# Patient Record
Sex: Female | Born: 1965 | Race: White | Hispanic: No | Marital: Single | Smoking: Current every day smoker
Health system: Southern US, Community
[De-identification: ages and names within clinical notes are randomized; demographics above are authoritative.]

## PROBLEM LIST (undated history)

## (undated) DIAGNOSIS — F1994 Other psychoactive substance use, unspecified with psychoactive substance-induced mood disorder: Secondary | ICD-10-CM

## (undated) DIAGNOSIS — F1910 Other psychoactive substance abuse, uncomplicated: Secondary | ICD-10-CM

## (undated) DIAGNOSIS — F410 Panic disorder [episodic paroxysmal anxiety] without agoraphobia: Secondary | ICD-10-CM

## (undated) DIAGNOSIS — R7611 Nonspecific reaction to tuberculin skin test without active tuberculosis: Secondary | ICD-10-CM

## (undated) DIAGNOSIS — F32A Depression, unspecified: Secondary | ICD-10-CM

## (undated) DIAGNOSIS — G8929 Other chronic pain: Secondary | ICD-10-CM

## (undated) DIAGNOSIS — E1140 Type 2 diabetes mellitus with diabetic neuropathy, unspecified: Secondary | ICD-10-CM

## (undated) DIAGNOSIS — I10 Essential (primary) hypertension: Secondary | ICD-10-CM

## (undated) DIAGNOSIS — K219 Gastro-esophageal reflux disease without esophagitis: Secondary | ICD-10-CM

## (undated) DIAGNOSIS — N390 Urinary tract infection, site not specified: Secondary | ICD-10-CM

## (undated) DIAGNOSIS — E119 Type 2 diabetes mellitus without complications: Secondary | ICD-10-CM

## (undated) DIAGNOSIS — F419 Anxiety disorder, unspecified: Secondary | ICD-10-CM

## (undated) DIAGNOSIS — M549 Dorsalgia, unspecified: Secondary | ICD-10-CM

## (undated) DIAGNOSIS — G629 Polyneuropathy, unspecified: Secondary | ICD-10-CM

## (undated) DIAGNOSIS — F329 Major depressive disorder, single episode, unspecified: Secondary | ICD-10-CM

## (undated) HISTORY — DX: Type 2 diabetes mellitus with diabetic neuropathy, unspecified: E11.40

## (undated) HISTORY — DX: Gastro-esophageal reflux disease without esophagitis: K21.9

## (undated) HISTORY — PX: CHOLECYSTECTOMY: SHX55

## (undated) HISTORY — DX: Urinary tract infection, site not specified: N39.0

## (undated) HISTORY — PX: TONSILLECTOMY: SUR1361

## (undated) HISTORY — PX: ABDOMINAL HYSTERECTOMY: SHX81

## (undated) HISTORY — DX: Nonspecific reaction to tuberculin skin test without active tuberculosis: R76.11

## (undated) HISTORY — PX: ROTATOR CUFF REPAIR: SHX139

---

## 1998-08-18 ENCOUNTER — Other Ambulatory Visit: Admission: RE | Admit: 1998-08-18 | Discharge: 1998-08-18 | Payer: Self-pay | Admitting: Obstetrics and Gynecology

## 1999-10-16 ENCOUNTER — Encounter: Admission: RE | Admit: 1999-10-16 | Discharge: 1999-10-16 | Payer: Self-pay | Admitting: Internal Medicine

## 2000-01-04 ENCOUNTER — Other Ambulatory Visit: Admission: RE | Admit: 2000-01-04 | Discharge: 2000-01-04 | Payer: Self-pay | Admitting: Obstetrics and Gynecology

## 2004-03-17 ENCOUNTER — Other Ambulatory Visit: Admission: RE | Admit: 2004-03-17 | Discharge: 2004-03-17 | Payer: Self-pay | Admitting: Obstetrics and Gynecology

## 2004-08-14 ENCOUNTER — Observation Stay (HOSPITAL_COMMUNITY): Admission: RE | Admit: 2004-08-14 | Discharge: 2004-08-15 | Payer: Self-pay | Admitting: Obstetrics and Gynecology

## 2007-02-24 ENCOUNTER — Emergency Department (HOSPITAL_COMMUNITY): Admission: EM | Admit: 2007-02-24 | Discharge: 2007-02-24 | Payer: Self-pay | Admitting: Emergency Medicine

## 2007-03-08 ENCOUNTER — Inpatient Hospital Stay (HOSPITAL_COMMUNITY): Admission: EM | Admit: 2007-03-08 | Discharge: 2007-03-11 | Payer: Self-pay | Admitting: General Surgery

## 2007-03-09 ENCOUNTER — Encounter (INDEPENDENT_AMBULATORY_CARE_PROVIDER_SITE_OTHER): Payer: Self-pay | Admitting: General Surgery

## 2008-12-13 ENCOUNTER — Encounter: Admission: RE | Admit: 2008-12-13 | Discharge: 2008-12-13 | Payer: Self-pay | Admitting: Family Medicine

## 2009-01-28 ENCOUNTER — Ambulatory Visit (HOSPITAL_COMMUNITY): Admission: RE | Admit: 2009-01-28 | Discharge: 2009-01-29 | Payer: Self-pay | Admitting: Obstetrics and Gynecology

## 2009-01-28 ENCOUNTER — Encounter (INDEPENDENT_AMBULATORY_CARE_PROVIDER_SITE_OTHER): Payer: Self-pay | Admitting: Obstetrics and Gynecology

## 2009-05-01 ENCOUNTER — Ambulatory Visit (HOSPITAL_BASED_OUTPATIENT_CLINIC_OR_DEPARTMENT_OTHER): Admission: RE | Admit: 2009-05-01 | Discharge: 2009-05-01 | Payer: Self-pay | Admitting: General Surgery

## 2009-05-01 ENCOUNTER — Encounter (INDEPENDENT_AMBULATORY_CARE_PROVIDER_SITE_OTHER): Payer: Self-pay | Admitting: General Surgery

## 2011-01-10 LAB — CBC
Hemoglobin: 12.2 g/dL (ref 12.0–15.0)
MCHC: 31.7 g/dL (ref 30.0–36.0)
Platelets: 344 10*3/uL (ref 150–400)
RDW: 18.9 % — ABNORMAL HIGH (ref 11.5–15.5)

## 2011-01-10 LAB — APTT: aPTT: 32 seconds (ref 24–37)

## 2011-01-10 LAB — PROTIME-INR
INR: 1 (ref 0.00–1.49)
Prothrombin Time: 13.4 seconds (ref 11.6–15.2)

## 2011-01-10 LAB — URINALYSIS, ROUTINE W REFLEX MICROSCOPIC
Glucose, UA: NEGATIVE mg/dL
Ketones, ur: NEGATIVE mg/dL
Protein, ur: NEGATIVE mg/dL
pH: 5.5 (ref 5.0–8.0)

## 2011-01-10 LAB — BASIC METABOLIC PANEL
CO2: 24 mEq/L (ref 19–32)
Calcium: 9.2 mg/dL (ref 8.4–10.5)
Creatinine, Ser: 0.78 mg/dL (ref 0.4–1.2)
GFR calc non Af Amer: >60 mL/min (ref >60)
Glucose, Bld: 125 mg/dL — ABNORMAL HIGH (ref 70–99)
Sodium: 136 mEq/L (ref 135–145)

## 2011-01-10 LAB — URINE MICROSCOPIC-ADD ON

## 2011-01-10 LAB — DIFFERENTIAL
Basophils Absolute: 0.1 10*3/uL (ref 0.0–0.1)
Basophils Relative: 0 % (ref 0–1)
Lymphocytes Relative: 30 % (ref 12–46)
Monocytes Absolute: 0.9 10*3/uL (ref 0.1–1.0)
Neutro Abs: 8.8 10*3/uL — ABNORMAL HIGH (ref 1.7–7.7)
Neutrophils Relative %: 62 % (ref 43–77)

## 2011-01-13 LAB — BASIC METABOLIC PANEL
BUN: 11 mg/dL (ref 6–23)
BUN: 8 mg/dL (ref 6–23)
Calcium: 8.5 mg/dL (ref 8.4–10.5)
Calcium: 8.9 mg/dL (ref 8.4–10.5)
Chloride: 104 mEq/L (ref 96–112)
Chloride: 108 mEq/L (ref 96–112)
Creatinine, Ser: 0.66 mg/dL (ref 0.4–1.2)
Creatinine, Ser: 0.82 mg/dL (ref 0.4–1.2)
GFR calc Af Amer: >60 mL/min (ref >60)
GFR calc Af Amer: >60 mL/min (ref >60)
GFR calc non Af Amer: >60 mL/min (ref >60)

## 2011-01-13 LAB — GLUCOSE, CAPILLARY
Glucose-Capillary: 112 mg/dL — ABNORMAL HIGH (ref 70–99)
Glucose-Capillary: 129 mg/dL — ABNORMAL HIGH (ref 70–99)
Glucose-Capillary: 98 mg/dL (ref 70–99)

## 2011-01-13 LAB — CBC
MCHC: 32.4 g/dL (ref 30.0–36.0)
MCV: 74.7 fL — ABNORMAL LOW (ref 78.0–100.0)
MCV: 75.3 fL — ABNORMAL LOW (ref 78.0–100.0)
Platelets: 315 10*3/uL (ref 150–400)
Platelets: 402 10*3/uL — ABNORMAL HIGH (ref 150–400)
RBC: 4.84 MIL/uL (ref 3.87–5.11)
WBC: 13.5 10*3/uL — ABNORMAL HIGH (ref 4.0–10.5)
WBC: 14.5 10*3/uL — ABNORMAL HIGH (ref 4.0–10.5)

## 2011-01-13 LAB — PREGNANCY, URINE: Preg Test, Ur: NEGATIVE

## 2011-02-16 NOTE — Op Note (Signed)
Claudia Erickson NO.:  [id number]   MEDICAL RECORD NO.:  [id number]          PATIENT TYPE:  INP   LOCATION:  1333                         FACILITY:  [facility]   PHYSICIAN:  Anselm Pancoast. Weatherly, M.D.DATE OF BIRTH:  1966-01-26   DATE OF PROCEDURE:  03/09/2007  DATE OF DISCHARGE:                               OPERATIVE REPORT   PREOPERATIVE DIAGNOSIS:  Subacute cholecystitis with stones.   POSTOPERATIVE DIAGNOSIS:  Acute cholecystitis with stones.   OPERATION:  Laparoscopic cholecystectomy with no cholangiogram.   SURGEON:  Anselm Pancoast. Zachery Dakins, M.D.   ASSISTANT:  Velora Heckler, M.D.   HISTORY:  Claudia Erickson is a 45 year old female who has had previous  episodes of upper abdominal and epigastric pain.  For three weeks, she  has had more intense pain.  She had an ultrasound at [facility] on May  28 and was seen by Dr. Lurene Shadow in the office on June 4.  He recommended  that she be admitted from the office.  I was called, did not think she  would need surgery yesterday, and the surgery scheduling would have been  next to impossible, anyway.  She is not febrile.  She was started on  Unasyn and I saw her early this morning and added her to the OR schedule  for today.  She has no known drug allergies.  She had been started on  Unasyn and she was given a dose prior to going to the operating room.   DESCRIPTION OF PROCEDURE:  With the patient positioned on the OR table,  induction of general anesthesia, endotracheal tube, oral tube into the  stomach, then the abdomen was prepped with Betadine solution and draped  in a sterile manner.  A small incision was made below the umbilicus.  The deep ends of the [employer] was used to expose the fascia.  A small  vertical opening was made, the fascia picked up, the underlying  posterior peritoneum fascia was picked up, and I carefully entered into  the peritoneal cavity.  A purse-string suture of 0 Vicryl was placed and  the Hasson cannula introduced.  Upon placing the scope in the abdomen,  you could see a bulge in the right upper quadrant.  The liver looked  grossly unremarkable.  The upper 10 mm trocar was placed in the  subxiphoid area and the two lateral 5 mm trocars were placed at the  lateral appropriate position.   On trying to elevate the liver and peel this omentum off of this very  thickened gallbladder, it was difficult and after the top part of the  gallbladder had been exposed, we then switched to the 30 degree scope  for better exposure.  With traction and counter traction and very  carefully dissecting, we could not see exactly where the proximal  portion of the gallbladder is but we were taking very deliberate care  not to damage any of the common or hepatic duct.  We dissected down  through the fatty tissue then could see what looks like a transition.  There was a big stone impacted in the neck  of the gallbladder and this  was pushed back to the fundus of the gallbladder.  Then, very carefully,  encompassed this, what we think is the cystic duct, with a right angle  and put a clip on the junction of the cystic duct and the gallbladder.  A small opening was made proximally and we could see just a little bit  of bile, but attempts to get a cholangiogram catheter in it were  difficult and we held it in place with a clip and we could see just a  little bit of the cystic duct.  On trying to position the catheter, the  cystic duct was divided and separated and we elected to elevate it and  just doubly clip the cystic duct proximally, taking care that we were  not entering any of the common hepatic or common bile duct.  We were  comfortable with the clips, had a good closure.  Then, the anterior  branch of the cystic artery was identified, doubly clipped proximally,  singly distally, and then divided.  Using the hook electrocautery then  switching to the spatula, basically it was just necessary  to carve out  this very thickened wall gallbladder.  The fascial planes were basically  obliterated and we continued working, identifying the posterior branch  of the cystic artery that was, likewise, doubly clipped proximally and  divided.  Then, with dissection and irrigating carefully, we could get  the more proximal portion of the gallbladder separated from the bed.  The gallbladder was basically intrahepatic and as we ruffled the high  posterior wall, we did get into the gallbladder.  Fortunately, the  stones stayed in the proximal portion of the gallbladder.  We grasped  the posterior portion and just carved it out with cautery.  The  gallbladder and stones were placed in an endocatch bag.  I coagulated  the bed for good hemostasis, reinspected, and did not see any bile  leakage.  We had already decided that we were going to use a 19 Blake  drain which we did place.  We placed a couple of pieces of Surgicel in  the gallbladder fossa, coagulated it, and there was no bleeding.  We  then placed a 19 Blake drain through the lateral port and this was up in  the subhepatic area.  Next, the drain was sutured to the skin.  We  reinspected.  The irrigating fluid had been aspirated, there was no  bleeding.  We then switched the camera to the upper 10 mm port and  grasped the bag containing the gallbladder and brought it up at the  fascial level.  I opened the fascia just a little more so that we could  bring the bag containing the gallbladder from the peritoneal cavity.  The gallbladder removed, an additional figure-of-eight of 0 Vicryl was  placed in the fascia, both purse-string and figure-of-eight were tied.  Then, the fascia was anesthetized with Marcaine.  The subcutaneous  tissue was closed with 4-0 Vicryl.  Benzoin and Steri-Strips were placed  on the skin.  The patient tolerated the procedure satisfactory.  Steri- Strips on the skin.  We then extubated her and sent her to the  recovery  room in stable postoperative condition.  I am going to keep her on three  doses of Unasyn and hopefully, she will be ready for discharge tomorrow.  She will have the drain removed in the office on Monday or Tuesday.  I  will check the  liver function tests tomorrow morning and hopefully she  will be ready for discharge.           ______________________________  Anselm Pancoast. Zachery Dakins, M.D.     WJW/MEDQ  D:  03/09/2007  T:  03/09/2007  Job:  [id number]

## 2011-02-16 NOTE — Op Note (Signed)
Claudia Erickson, Claudia Erickson               ACCOUNT NO.:  [id number]   MEDICAL RECORD NO.:  [id number]          PATIENT TYPE:  OIB   LOCATION:  9309                          FACILITY:  [facility]   PHYSICIAN:  Sherron Monday, MD        DATE OF BIRTH:  Nov 30, 1965   DATE OF PROCEDURE:  01/28/2009  DATE OF DISCHARGE:                               OPERATIVE REPORT   PREOPERATIVE DIAGNOSES:  Menorrhagia, dysmenorrhea, complex ovarian  cyst.   POSTOPERATIVE DIAGNOSES:  Menorrhagia, dysmenorrhea, normal bilateral  ovaries.   PROCEDURE:  Laparoscopic-assisted vaginal hysterectomy.   SURGEON:  Sherron Monday, MD   ASSISTANT:  Zenaida Niece, MD   ANESTHESIA:  General with 10 mL of 1% lidocaine for local anesthetic.   FINDINGS:  Normal uterus approximately 6 weeks size with small  fibroids, bilateral normal ovaries.   SPECIMEN:  Uterus and cervix.   DISPOSITION:  To pathology.   ESTIMATED BLOOD LOSS:  200 mL.   IV FLUIDS:  2900 mL.   URINE OUTPUT:  100 mL clear urine at the end of the procedure.   COMPLICATIONS:  None.   DISPOSITION:  Stable to PACU following the procedure.   PROCEDURE:  After informed consent was reviewed with the patient  including risks, benefits, and alternatives of surgical procedure, she  was transported to the OR and placed on the table in supine position.  General anesthesia was then induced and found to be adequate.  She was  then placed on the yellow fin stirrups, prepped and draped in the normal  sterile fashion.  Her bladder was sterilely drained using an open-sided  speculum.  Her cervix was grasped with single-tooth tenaculum and the  uterine manipulator was placed.  Gloves were changed.  Attention was  turned to the abdominal portion of the case and approximately 5 mL  infraumbilical incision was made using the Veress needle.  The  peritoneum was entered.  It was carefully checked to make sure the  peritoneum has been entered with the hanging drop test.  The gas  was  then instilled using direct entry the 5 mL trocar was placed.  The  accessory ports were placed after careful visualization of the  epigastric vessels bilaterally at the level of the umbilicus using the  blunt tip probe.  Her pelvic survey was performed revealing normal  uterus and ovaries with evidence of prior tubal ligation using a 5-mm  single-tooth tenaculum was used for uterine manipulation and using the  harmonic scalpel the tube and round ligament were transected and the  cardinal ligaments were separated.  A bladder flap was created.  Attention was then turned to the right side which in a similar fashion.  The round ligament and tube were transected and using the harmonic  scalpel the cardinal ligament was incised.  The uterine artery was also  sacrificed to the right with good hemostasis being assured following  this procedure.  Attention was then turned to the vaginal portion of the  case.  A long heavy weighted speculum was placed in her vagina with a  Sims retractor.  Her cervix was visualized grasped with digitally and  circumscribed using Bovie cautery.  The peritoneum was then entered in  the posterior aspect and the uterosacral ligaments were transected and  suture ligated and held.  Bites were taken bilaterally to the level that  the uterus was able to be delivered without complication.  The pedicles  were found to be hemostatic.  The uterosacral ligaments were plicated  and the held sutures were tied together.  The cuff was closed  incorporating the peritoneum.  The cuff was closed with 2-0 Vicryl in a  running fashion and small amount of excess vaginal tissue was excised  and included with the specimen.  Gloves were changed.  Attention was  turned to the abdominal portion of the case.  A brief inspection  revealed hemostasis, so the ports were removed under direct  visualization.  The pneumoperitoneum was evacuated.  The ports were  closed with 0 Vicryl, the deep  stitch, and 4-0 Vicryl at the level of  the skin, and the skin was closed with Dermabond as well.  Sponge, lap,  and needle counts were correct x2 at the end of the procedure.       Sherron Monday, MD  Electronically Signed     JB/MEDQ  D:  01/28/2009  T:  01/29/2009  Job:  [id number]

## 2011-02-16 NOTE — H&P (Signed)
Claudia Erickson, Claudia Erickson NO.:  [id number]   MEDICAL RECORD NO.:  [id number]         PATIENT TYPE:  WAMB   LOCATION:                                FACILITY:  [facility]   PHYSICIAN:  Sherron Monday, MD        DATE OF BIRTH:  02-13-1966   DATE OF ADMISSION:  01/28/2009  DATE OF DISCHARGE:                              HISTORY & PHYSICAL   ADMITTING DIAGNOSIS:  Dysmenorrhea, menorrhagia as well as complex  ovarian cyst.   PROCEDURE PLANNED:  Laparoscopic-assisted vaginal hysterectomy with  plus/minus BSO.   HISTORY OF PRESENT ILLNESS:  A 45 year old G6, P 2-0-4-2 who presents  complaining of painful menses that she describes are heavy and passing  multiple clots as well lots of premenstrual symptoms.  She states she  has tried ibuprofen and heating pads without relief.  After the  discussion of options including hormonal management, NovaSure,  endometrial ablation versus hysterectomy the patient desired a  hysterectomy for definitive management.  Discussed with the patient  risks, benefits and alternatives of the procedure.  She has complained  also of occasional night sweats as well as some incontinence, this is  following a urethral sling and she was advised to consult with urology.   PAST MEDICAL HISTORY:  Significant for depression and anxiety, she is on  medications and states her mood is currently okay.   PAST SURGICAL HISTORY:  1. Left rotator cuff surgery.  2. Bilateral tubal ligation.  3. Urethral sling in 2005.  4. Laparoscopic cholecystectomy.   MEDICATIONS:  Cymbalta, Xanax and omeprazole.   ALLERGIES:  NO KNOWN DRUG ALLERGIES.   SOCIAL HISTORY:  She admits to smoking a quarter pack a day, no alcohol  or drug use.  She is divorced and unemployed.   PAST GYN HISTORY:  G6, P2-0-4-2 with pregnancies G1 to G3 were first  trimester therapeutic abortions, G4 was a term vaginal delivery of a  female infant weighing 8 pounds 14 ounces, G5 was a therapeutic  abortion,  G6 with a term vaginal delivery of female infant weighing 7 pounds 14  ounces and she had pregnancy-induced hypertension with this pregnancy.  She is sexually active with a female partner.  She believes they are  monogamous.  Contraception includes bilateral tubal ligation as well as  a vasectomy.  States she has no intermenstrual bleeding, no postcoital  bleeding, no problems with this discharge, itching or burning, no  problems with dyspareunia.   GYN HISTORY:  Has no history of any abnormal Pap smears, her last was in  March of 2010, within normal limits.  No history of any sexually  transmitted diseases.  Menarche at age 34.  Monthly cycles are between 3  - 4 days and 7 days and described as heavy and painful.   PHYSICAL EXAM:  She is 5 foot 9, weighs 318 pounds, blood pressure  126/82.  GENERAL:  No apparent distress.  HEENT:  Mucous membranes are moist.  NECK:  No lymphadenopathy, thyroid within normal limits.  HEART:  Regular rate and rhythm.  LUNGS:  Clear to auscultation bilaterally.  BACK:  No costovertebral angle tenderness.  BREASTS:  D cup, no masses noted, nontender, no distortion.  ABDOMEN:  Soft, nontender, no distortion.  Good bowel sounds and she is  obese.  EXTREMITIES:  Symmetric and nontender.  PELVIC:  Normal external female genitalia, normal Bartholin's, urethral  and Skene gland with good support.  Cervix and vagina without lesions.  No cervical motion tenderness.  Uterus was a normal size.  Adnexa no  masses, nontender.  The bimanual exam is difficult secondary to her  habitus.   ASSESSMENT AND PLAN:  A 45 year old G6, P2-0-4-2 who presents desiring  definitive management for menorrhagia and dysmenorrhea.  Discussed with  the patient possibility of continued pelvic pain and premenstrual  syndrome symptoms as well as likely continuation of her incontinence  until she has urology evaluation if they decide to do any therapy for  this.  Discussed with  patient risks, benefits and alternatives of the  surgical procedure including bleeding, infection, damage to the  surrounding organs as well as trouble healing and possibility of needing  an open procedure.  She voices understanding to all this and we will  proceed with a hysterectomy on April 27.      Sherron Monday, MD  Electronically Signed     JB/MEDQ  D:  01/27/2009  T:  01/27/2009  Job:  [id number]

## 2011-02-16 NOTE — Discharge Summary (Signed)
Claudia Erickson, Claudia Erickson               ACCOUNT NO.:  [id number]   MEDICAL RECORD NO.:  [id number]          PATIENT TYPE:  OIB   LOCATION:  9309                          FACILITY:  [facility]   PHYSICIAN:  Sherron Monday, MD        DATE OF BIRTH:  1966/02/24   DATE OF ADMISSION:  01/28/2009  DATE OF DISCHARGE:  01/29/2009                               DISCHARGE SUMMARY   ADMITTING DIAGNOSES:  Dysmenorrhea, menorrhagia, complex ovarian cyst.   DISCHARGE DIAGNOSES:  Dysmenorrhea, menorrhagia, complex ovarian cyst,  status post laparoscopic-assisted vaginal hysterectomy.   PROCEDURE:  Laparoscopic-assisted vaginal hysterectomy.   HISTORY OF PRESENT ILLNESS:  A 45 year old G6, P 2-0-4-2, who initially  presented with dysmenorrhea and menorrhagia.  For complete H&P, please  see the dictated note.  In brief, the patient underwent surgery on the  27th without complication and EBL of 200 mL.  Findings were;  approximately 6 weeks' size uterus with small exophytic fibroids and a  normal appearing bilateral ovaries.  Prior to surgery, fasting had some  elevated blood sugars, this was monitored.  After surgery, her sugars  were not found to be in a dangerous level.  She will be referred to a  general doctor, as she does not have one following her hospitalization.  Her postoperative course, she is afebrile.  Vital signs stable.  She was  ambulating well, tolerating p.o., and her pain was well controlled.  On  postoperative day #1, she was discharged to home with routine discharge  instructions and numbers to call with any questions or problems, as well  as prescriptions for Motrin and Vicodin and instructions to continue  with a multivitamin.  Her hemoglobin decreased from 11.6 to 9.4.  Her  creatinine increased from 0.66-0.82.      Sherron Monday, MD  Electronically Signed     JB/MEDQ  D:  01/29/2009  T:  01/29/2009  Job:  [id number]

## 2011-02-16 NOTE — H&P (Signed)
NAMENARCISSA, MELDER NO.:  [id number]   MEDICAL RECORD NO.:  [id number]          PATIENT TYPE:  INP   LOCATION:  1333                         FACILITY:  [facility]   PHYSICIAN:  Anselm Pancoast. Weatherly, M.D.DATE OF BIRTH:  [birth year]   DATE OF ADMISSION:  03/08/2007  DATE OF DISCHARGE:                              HISTORY & PHYSICAL   CHIEF COMPLAINT:  Right upper quadrant pain.   HISTORY:  Claudia Erickson is a 45 year old female who was seen in the  office on Wednesday, June 5, for about a 3-week history of recurring  right upper quadrant abdominal pain.  She had an ultrasound and when she  was visiting one of the urgent care centers on May 28 that was  consistent with acute cholecystitis.  She was then seen by her regular  doctor and I am not sure why it was a week before she finally was given  an appointment in the Urgent Care and was seen by Dr. Lurene Shadow.  She was  vaguely tender in the right upper quadrant.  She takes Xanax and over-  the-counter H2 blockers for esophageal reflux symptoms.   She has a past medical history of bladder repair and a rotator cuff  repair.  She denies allergies.  She works as a Medical illustrator.  Dr.  Lurene Shadow sent her over from the office for admission and notified me.  I  did not think she was acute that would require emergency surgery and  ordered labs, as there had not been any laboratory studies detected.  She had her ultrasound of the gallbladder done at [facility] on Mar 01, 2007 that showed findings consistent with acute cholecystitis with a  markedly thickened gallbladder with evidence of large stone, no biliary  organomegaly and no biliary dilatation and a prominent liver.  The  patient was sent on over and orders were sent with her to start her on  Unasyn and lab studies.  The laboratory studies showed white count of  14,200 and liver function studies were unremarkable and her amylase was  normal.  I saw her early this morning  and added her onto the OR  schedule.  Dr. Danella Penton impression was she had a subacute cholecystitis  with stones; I agree with this.  She has exogenous obesity.   PHYSICAL EXAMINATION:  VITAL SIGNS:  Her temperature was normal at 98.2,  her pulse was 80, blood pressure was 124/80.  HEENT:  Unremarkable.  LUNGS:  Clear.  CARDIAC:  Normal sinus rhythm.  ABDOMEN:  She is vaguely tender in the right upper quadrant, a very  large abdomen.  I do not appreciate any umbilical or groin hernias.  There are just no previous abdominal incisions except for the bladder  incision.  EXTREMITIES:  There is no pedal edema.  Good peripheral pulses.  CNS:  Physiologic.           ______________________________  Anselm Pancoast. Zachery Dakins, M.D.     WJW/MEDQ  D:  03/09/2007  T:  03/10/2007  Job:  [id number]

## 2011-02-16 NOTE — Op Note (Signed)
Claudia Erickson, Claudia Erickson               ACCOUNT NO.:  [id number]   MEDICAL RECORD NO.:  [id number]          PATIENT TYPE:  AMB   LOCATION:  DSC                          FACILITY:  [facility]   PHYSICIAN:  Almond Lint, MD       DATE OF BIRTH:  [birth year]   DATE OF PROCEDURE:  05/01/2009  DATE OF DISCHARGE:                               OPERATIVE REPORT   PREOPERATIVE DIAGNOSIS:  Hemorrhoids.   POSTOPERATIVE DIAGNOSIS:  External hemorrhoids.   PROCEDURE:  Excision of posterior external hemorrhoids.   SURGEON:  Almond Lint, MD   ANESTHESIA:  General and local.   FINDINGS:  Circumferential external hemorrhoids, one internal hemorrhoid  at the right posterior lateral position.   SPECIMEN:  Hemorrhoidal tissue to Pathology.   ESTIMATED BLOOD LOSS:  Minimal.   COMPLICATIONS:  None known.   PROCEDURE:  [name] was identified in the holding area and taken to  the operating room where she was placed supine on the operating room  table.  General anesthesia was induced.  She was placed into the  lithotomy position.  Her perineum was prepped and draped in a sterile  fashion.  Time-out was performed according to the surgical safety check  list.  When all was correct, we continued.  The anus was first examined  digitally.  There were no masses palpable.  The anus was then examined  with the anoscope.  Predominantly, the finding with external  hemorrhoids.  There were no significant internal hemorrhoids.  The ones  posteriorly appeared the most friable and large.  These were grasped  with an Allis and taken with the Bovie cautery.  The skin overlying  these was not closed as it would have too large of a defect.  The anus  was reexamined with the anoscope and there was component of an internal  hemorrhoid that was then banded.  The area was then anesthetized with a  mixture of 1% lidocaine plain and 0.25% Marcaine with epinephrine.  The  anus was then dressed with Gelfoam with lidocaine jelly.   This was  advanced into the anus.  The lidocaine jelly was also placed on top of  the hemorrhoidal defect.  The patient was awakened from anesthesia and  taken to PACU in stable condition.      Almond Lint, MD  Electronically Signed     FB/MEDQ  D:  05/01/2009  T:  05/01/2009  Job:  [id number]

## 2011-02-19 NOTE — Op Note (Signed)
NAMETIERRE, GERARD NO.:  [id number]   MEDICAL RECORD NO.:  [id number]          PATIENT TYPE:  OBV   LOCATION:  9304                          FACILITY:  [facility]   PHYSICIAN:  Osborn Coho, M.D.   DATE OF BIRTH:  1966/05/09   DATE OF PROCEDURE:  08/14/2004  DATE OF DISCHARGE:  08/15/2004                                 OPERATIVE REPORT   ADDENDUM:   DESCRIPTION OF PROCEDURE:  (Description of TVT portion of procedure and  anterior repair).   The patient was taken to the operating room as described by Dr. Normand Sloop and  open laparoscopic tubal ligation performed with my assistance.  Upon  attention to the vagina, the TVT and anterior repair was performed.  The TVT  was performed first by making 5 mm incisions bilaterally at the symphysis  pubis two fingerbreadths from the midline after injection of Marcaine.  Attention was then turned to the vagina where dilute Pitressin was injected  in the vaginal mucosa at the level of the mid urethra.  An approximately 1  cm incision was made in the vaginal mucosa at the level of the mid urethra  and the vaginal mucosa undermined bilaterally.  The Foley was removed and a  catheter guide was placed in an 33 [place] Foley which was then inserted into  the urethra and bladder.  While diverting the urethral catheter guide to the  patient's left, the transabdominal guide was placed in the right incision at  the symphysis pubis and followed behind the symphysis pubis in the space of  Retzius and out through the incision at the level of the mid urethra in the  vagina.  The same was done on the left while diverting the catheter guide to  the patient's right.  The catheter guide was then removed and cystoscope  introduced and no entry of the transabdominal guides into the bladder noted.  The TVT was then attached below to the transabdominal guides and passed  through the incisions at the symphysis pubis bilaterally.  This was done  after  draining the bladder completely following cystoscopy.  The Foley was  again removed and cystoscope introduced and no entry of the TVT into the  bladder noted.  The sheath was removed bilaterally while a Tresa Endo was placed  between the TVT and urethra.  There was sufficient slack noted between the  actual TVT and the urethra after the sheath was removed.  The tape was then  cut at the level of the skin bilaterally at the symphysis pubis.  The skin  incisions were closed with a 3-0 Monocryl using a subcuticular stitch. The  vaginal incision was repaired with 2-0 Vicryl in a running locked fashion.  Attention was then turned to the cystocele where a midline incision was  performed, vaginal mucosa undermined bilaterally and cystocele repaired with  2-0 Vicryl using a purse string stitch.  The mucosa was repaired with 2-0  Vicryl in a running locked fashion.  The vagina was packed with plain  packing prepped with estrogen.  The incisions were noted to be hemostatic.  Sponge,  lap and needle counts were correct.  The patient tolerated the  procedure well and was returned to the recovery room in good condition.     Ange   AR/MEDQ  D:  09/08/2004  T:  09/09/2004  Job:  [id number]

## 2011-02-19 NOTE — Op Note (Signed)
Claudia Erickson, Claudia Erickson               ACCOUNT NO.:  [id number]   MEDICAL RECORD NO.:  [id number]          PATIENT TYPE:  INP   LOCATION:  9304                          FACILITY:  [facility]   PHYSICIAN:  Naima A. Dillard, M.D. DATE OF BIRTH:  18-Mar-1966   DATE OF PROCEDURE:  08/14/2004  DATE OF DISCHARGE:                                 OPERATIVE REPORT   PREOPERATIVE DIAGNOSES:  1.  Multiparity.  2.  Stress urinary incontinence.  3.  Cystocele.   POSTOPERATIVE DIAGNOSES:  1.  Multiparity.  2.  Stress urinary incontinence.  3.  Cystocele.   PROCEDURE:  Open laparoscopy, laparoscopic tubal ligation, transvaginal tape  and anterior repair.   ANESTHESIA:  General endotracheal tube.   ESTIMATED BLOOD LOSS:  150 mL.   URINE OUTPUT:  200 mL.   IV FLUIDS:  2200 mL crystalloid.   SURGEON:  Naima A. Normand Sloop, M.D. and Osborn Coho, M.D.   COMPLICATIONS:  None.   FINDINGS:  Normal pelvic and abdominal anatomy.  The uterus was normal size  with two small fibroids less than 1 cm.  Fibroids, normal appearing tubes  and adnexa bilaterally, normal appearing appendix. The patient had a primary  cystocele and the patient had some filmy adhesions from the bowel to the  right mid quadrant.   DESCRIPTION OF PROCEDURE:  The patient was taken to the operating room where  she was given general anesthesia, placed in dorsal lithotomy position and  prepped and draped in a normal sterile fashion.  A Foley catheter was  placed, a bivalve speculum was placed into the vagina.  The anterior lip of  the cervix was grasped with a single tooth tenaculum. The Hulka manipulator  was placed into the uterine cavity through the cervix. A single tooth  tenaculum and bivalve speculum was then removed. Attention was then turned  to the abdomen where 5 mL of 0.25% Marcaine was placed into the inferior  umbilical area.  Claudia Erickson' were used to hold the skin and a 10 mm  infraumbilical incision was made with a scalpel and  carried down to the  fascia. The fascia was then incised and extended bilaterally, the peritoneum  was identified and entered sharply. The Hasson was then placed into the  cavity, intraabdominal placement was confirmed with the laparoscope. The  abdomen was insufflated with 3 liters CO2 gas, findings noted above were  seen.  A 5 mm incision was made 2 cm above the symphysis pubis in the  midline. After 3 mL of 0.25% Marcaine was injected, a 5 mm trocar was placed  under direct visualization of the laparoscope.  The patient's right  fallopian tube was grasped at the mid isthmic portion of the tube and  fulgurated x3, hemostasis was assured. The patient's left fallopian tube was  grasped at the mid isthmic portion of the tube and fulgurated x3.  Hemostasis was assured. All instruments were removed from the abdomen under  direct visualization of the laparoscope. Because it was an open laparoscopy,  sutures were already placed into the fascia before the Hasson was placed and  these were  tied down to close the 10 mm port.  The 5 mm subcuticular  incision was closed with 3-0 Monocryl.  Attention was then turned to the  patient's vagina where anterior repair and TVT was done by Dr. Les Pou and  assisted by me. She will dictate what was done in that case.  AT the end of  the case, the vagina was packed with packing.  The patient went to the  recovery room in stable condition.     Naim   NAD/MEDQ  D:  08/14/2004  T:  08/15/2004  Job:  [id number]

## 2011-02-19 NOTE — H&P (Signed)
Claudia Erickson, Claudia Erickson               ACCOUNT NO.:  [id number]   MEDICAL RECORD NO.:  [id number]          PATIENT TYPE:  INP   LOCATION:  NA                            FACILITY:  [facility]   PHYSICIAN:  Naima A. Dillard, M.D. DATE OF BIRTH:  [birth year]   DATE OF ADMISSION:  08/14/2004  DATE OF DISCHARGE:                                HISTORY & PHYSICAL   CHIEF COMPLAINT:  Multiparity, desires sterilization and stress urinary  incontinence.   HISTORY OF PRESENT ILLNESS:  This patient is a 45 year old white female  gravida 6, para 2, who desires bilateral tubal ligation and TVT.  The  patient used an IUD for 12 years, was recently removed and she desires  permanent sterilization.  The patient also was complaining of urinary  frequency and leakage of fluid with sneezing.  Cystometric's was done by Dr.  Su Hilt which was consistent with stress urinary incontinence.   PAST MEDICAL HISTORY:  As above.   PAST SURGICAL HISTORY:  Significant for rotator cuff repair, tonsillectomy,  wisdom teeth.   PAST OBSTETRIC HISTORY:  Significant for vaginal delivery x2 without any  complications.   MEDICATIONS:  Zoloft.   FAMILY HISTORY:  The patient denies any GYN cancer.  Her mother has  hypertension, no diabetes.   SOCIAL HISTORY:  The patient smokes a pack a day of cigarettes for the last  year.  No alcohol or illicit drug use.   REVIEW OF SYSTEMS:  GENITOURINARY:  As above.  ENDOCRINE:  Unremarkable.  PSYCHIATRIC:  Significant for depression.  CARDIOVASCULAR:  Unremarkable.  GASTROINTESTINAL:  Significant for a hemorrhoid.   PHYSICAL EXAMINATION:  VITAL SIGNS:  The patient's blood pressure is 126/90,  her weight is 197 pounds.  HEART:  A regular rate and rhythm.  LUNGS:  Clear to auscultation bilaterally.  ABDOMEN:  Obese, soft and nontender.  PELVIC:  Vulva and vaginal exams are within normal limits with a primary  cystocele.  Cervix has no lesions with a nontender uterus that is normal  sized, mobile and nontender with no prolapse.  The patient has no adnexal  masses or pain.  RECTAL:  Rectum has a large 2 cm hemorrhoid.   ASSESSMENT:  Multiparity and stress urinary incontinence.   PLAN:  Laparoscopic tubal ligation.  The patient understands the risks are  but not limited to bleeding, infection, damage to internal organs such as  bowel, bladder and major blood vessels.  All birth control was reviewed with  the patient with failure rates and risks and benefits.  The patient still  chooses tubal ligation.  The risk of failure rate  about 1 in 200 or 1 in 300 was also discussed with the patient.  The patient  desires to proceed as above.  Dr. Su Hilt saw the patient and consented her  for TVT and possible anterior posterior repair.  The patient understands the  risks and benefits of that.     Naim   NAD/MEDQ  D:  08/14/2004  T:  08/14/2004  Job:  [id number]

## 2011-02-19 NOTE — Discharge Summary (Signed)
[name], [name] NO.:  [id number]   MEDICAL RECORD NO.:  [id number]          PATIENT TYPE:  INP   LOCATION:  1333                         FACILITY:  [facility]   PHYSICIAN:  Anselm Pancoast. Weatherly, M.D.DATE OF BIRTH:  1966-03-18   DATE OF ADMISSION:  03/08/2007  DATE OF DISCHARGE:  03/11/2007                               DISCHARGE SUMMARY   DISCHARGE DIAGNOSIS:  Acute cholecystitis with stones.   OPERATIONS:  Laparoscopic cholecystectomy with cholangiogram.   HISTORY:  Claudia Erickson is a 45 year old female who was seen in the  office on June 5 for approximately a 3-week history of recurrent right  upper quadrant abdominal pain.  She had an ultrasound when she was  visiting one of the Urgent Care Centers on May 28 that was consistent  with acute cholecystitis.  She was then seen by her regular doctor and  returned to the Urgent Care, and he arranged for her to be seen in the  office, and she was seen by Dr. Lurene Shadow.  She was vaguely tender in the  right upper quadrant. She takes Xanax and over-the-counter medicines for  esophageal reflux. She saw Dr. Lurene Shadow in the office.  He notified me.  Did not think that she had acute cholecystitis but recommended she be  admitted and added on to the OR schedule for the following day. The  ultrasound that had been down at outside facility on May 28 showed  findings for acute cholecystitis with a markedly thickened gallbladder  with a large stone.  There were no dilated bile ducts.   When she was admitted, her white count was 14,200.  We start her on  unison, and I was in agreement that she needed surgery but thought it  was not needed that evening. She was added to the OR schedule for the  following morning and taken to the OR.  Dr. Gerrit Friends assisted.  She had a  markedly inflamed, subacute gallbladder with a large stone impacted in  it.  We were able to successfully remove the gallbladder with the  laparoscope, but, because of  the marked inflammation, etc., placed a  Blake drain.   Postoperatively, she did fine.  She was a little sore, though I thought  would be safe to let her go home. She wanted to stay another day, and I  was in agreement.  She was ready for discharge in improved condition on  March 11, 2007, and we are going to give her Augmentin 875 b.i.d. for 4  days, and then she will return to the office Monday or Tuesday, 3-4 days  later, for removal of the drain and will see Dr. Gerrit Friends.  Her discharge  course has gone nicely.  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  There is  no bile drainage in the drains, and the Blake drain can be removed early  next week.           ______________________________  Anselm Pancoast. Zachery Dakins, M.D.     WJW/MEDQ  D:  03/28/2007  T:  03/28/2007  Job:  [id number]

## 2011-07-22 LAB — CBC
HCT: 32.6 — ABNORMAL LOW
HCT: 34.2 — ABNORMAL LOW
Hemoglobin: 10.5 — ABNORMAL LOW
Hemoglobin: 11.3 — ABNORMAL LOW
MCHC: 33.1
MCV: 72.2 — ABNORMAL LOW
Platelets: 324
RBC: 4.74
WBC: 12.4 — ABNORMAL HIGH

## 2011-07-22 LAB — COMPREHENSIVE METABOLIC PANEL
ALT: 21
ALT: 32
Albumin: 2.6 — ABNORMAL LOW
Alkaline Phosphatase: 45
BUN: 14
BUN: 5 — ABNORMAL LOW
CO2: 27
Calcium: 9.2
Chloride: 107
Creatinine, Ser: 0.88
GFR calc non Af Amer: >60
Glucose, Bld: 106 — ABNORMAL HIGH
Glucose, Bld: 94
Potassium: 4.1
Sodium: 139
Total Bilirubin: 0.4
Total Protein: 5.1 — ABNORMAL LOW

## 2011-07-22 LAB — DIFFERENTIAL
Eosinophils Absolute: 0.2
Lymphocytes Relative: 25
Lymphs Abs: 3.6 — ABNORMAL HIGH
Neutro Abs: 10 — ABNORMAL HIGH
Neutrophils Relative %: 69

## 2011-07-22 LAB — URINALYSIS, ROUTINE W REFLEX MICROSCOPIC
Bilirubin Urine: NEGATIVE
Glucose, UA: NEGATIVE
Ketones, ur: NEGATIVE
Protein, ur: NEGATIVE
pH: 6.5

## 2011-07-22 LAB — LIPASE, BLOOD: Lipase: 19

## 2013-07-05 ENCOUNTER — Other Ambulatory Visit: Payer: Self-pay | Admitting: Dermatology

## 2013-12-05 ENCOUNTER — Emergency Department (HOSPITAL_COMMUNITY): Payer: Self-pay

## 2013-12-05 ENCOUNTER — Inpatient Hospital Stay (HOSPITAL_COMMUNITY)
Admission: EM | Admit: 2013-12-05 | Discharge: 2013-12-07 | DRG: 177 | Disposition: A | Discharge status: Home / Self Care | Payer: Self-pay | Attending: Internal Medicine | Admitting: Internal Medicine

## 2013-12-05 ENCOUNTER — Encounter (HOSPITAL_COMMUNITY): Payer: Self-pay | Admitting: Emergency Medicine

## 2013-12-05 DIAGNOSIS — E872 Acidosis, unspecified: Secondary | ICD-10-CM | POA: Diagnosis present

## 2013-12-05 DIAGNOSIS — J9600 Acute respiratory failure, unspecified whether with hypoxia or hypercapnia: Secondary | ICD-10-CM | POA: Diagnosis present

## 2013-12-05 DIAGNOSIS — Z6841 Body Mass Index (BMI) 40.0 and over, adult: Secondary | ICD-10-CM

## 2013-12-05 DIAGNOSIS — IMO0001 Reserved for inherently not codable concepts without codable children: Secondary | ICD-10-CM | POA: Diagnosis present

## 2013-12-05 DIAGNOSIS — T17908A Unspecified foreign body in respiratory tract, part unspecified causing other injury, initial encounter: Secondary | ICD-10-CM

## 2013-12-05 DIAGNOSIS — R112 Nausea with vomiting, unspecified: Secondary | ICD-10-CM

## 2013-12-05 DIAGNOSIS — R7989 Other specified abnormal findings of blood chemistry: Secondary | ICD-10-CM

## 2013-12-05 DIAGNOSIS — J690 Pneumonitis due to inhalation of food and vomit: Principal | ICD-10-CM | POA: Diagnosis present

## 2013-12-05 DIAGNOSIS — E1165 Type 2 diabetes mellitus with hyperglycemia: Secondary | ICD-10-CM

## 2013-12-05 DIAGNOSIS — I10 Essential (primary) hypertension: Secondary | ICD-10-CM | POA: Diagnosis present

## 2013-12-05 DIAGNOSIS — N390 Urinary tract infection, site not specified: Secondary | ICD-10-CM | POA: Diagnosis present

## 2013-12-05 DIAGNOSIS — Z91199 Patient's noncompliance with other medical treatment and regimen due to unspecified reason: Secondary | ICD-10-CM

## 2013-12-05 DIAGNOSIS — R945 Abnormal results of liver function studies: Secondary | ICD-10-CM

## 2013-12-05 DIAGNOSIS — Z9119 Patient's noncompliance with other medical treatment and regimen: Secondary | ICD-10-CM

## 2013-12-05 DIAGNOSIS — F1410 Cocaine abuse, uncomplicated: Secondary | ICD-10-CM

## 2013-12-05 DIAGNOSIS — F1910 Other psychoactive substance abuse, uncomplicated: Secondary | ICD-10-CM | POA: Diagnosis present

## 2013-12-05 DIAGNOSIS — R55 Syncope and collapse: Secondary | ICD-10-CM

## 2013-12-05 DIAGNOSIS — Z794 Long term (current) use of insulin: Secondary | ICD-10-CM

## 2013-12-05 DIAGNOSIS — Z833 Family history of diabetes mellitus: Secondary | ICD-10-CM

## 2013-12-05 DIAGNOSIS — E860 Dehydration: Secondary | ICD-10-CM | POA: Diagnosis present

## 2013-12-05 DIAGNOSIS — T401X4A Poisoning by heroin, undetermined, initial encounter: Secondary | ICD-10-CM | POA: Diagnosis present

## 2013-12-05 DIAGNOSIS — D72829 Elevated white blood cell count, unspecified: Secondary | ICD-10-CM | POA: Diagnosis present

## 2013-12-05 DIAGNOSIS — F17200 Nicotine dependence, unspecified, uncomplicated: Secondary | ICD-10-CM | POA: Diagnosis present

## 2013-12-05 DIAGNOSIS — E119 Type 2 diabetes mellitus without complications: Secondary | ICD-10-CM | POA: Diagnosis present

## 2013-12-05 DIAGNOSIS — A599 Trichomoniasis, unspecified: Secondary | ICD-10-CM | POA: Diagnosis present

## 2013-12-05 DIAGNOSIS — F1420 Cocaine dependence, uncomplicated: Secondary | ICD-10-CM | POA: Diagnosis present

## 2013-12-05 DIAGNOSIS — Z7982 Long term (current) use of aspirin: Secondary | ICD-10-CM

## 2013-12-05 HISTORY — DX: Essential (primary) hypertension: I10

## 2013-12-05 HISTORY — DX: Type 2 diabetes mellitus without complications: E11.9

## 2013-12-05 LAB — CBC WITH DIFFERENTIAL/PLATELET
Basophils Absolute: 0 10*3/uL (ref 0.0–0.1)
Basophils Relative: 0 % (ref 0–1)
Eosinophils Absolute: 0.1 10*3/uL (ref 0.0–0.7)
Eosinophils Relative: 0 % (ref 0–5)
HEMATOCRIT: 45.4 % (ref 36.0–46.0)
HEMOGLOBIN: 15.9 g/dL — AB (ref 12.0–15.0)
LYMPHS PCT: 10 % — AB (ref 12–46)
Lymphs Abs: 2.1 10*3/uL (ref 0.7–4.0)
MCH: 30.2 pg (ref 26.0–34.0)
MCHC: 35 g/dL (ref 30.0–36.0)
MCV: 86.3 fL (ref 78.0–100.0)
MONO ABS: 1 10*3/uL (ref 0.1–1.0)
MONOS PCT: 5 % (ref 3–12)
NEUTROS ABS: 17.5 10*3/uL — AB (ref 1.7–7.7)
Neutrophils Relative %: 85 % — ABNORMAL HIGH (ref 43–77)
Platelets: 238 10*3/uL (ref 150–400)
RBC: 5.26 MIL/uL — ABNORMAL HIGH (ref 3.87–5.11)
RDW: 12.9 % (ref 11.5–15.5)
WBC: 20.7 10*3/uL — AB (ref 4.0–10.5)

## 2013-12-05 LAB — URINE MICROSCOPIC-ADD ON

## 2013-12-05 LAB — URINALYSIS, ROUTINE W REFLEX MICROSCOPIC
Bilirubin Urine: NEGATIVE
Glucose, UA: >1000 mg/dL — AB
Ketones, ur: NEGATIVE mg/dL
Nitrite: NEGATIVE
PROTEIN: 100 mg/dL — AB
Specific Gravity, Urine: 1.034 — ABNORMAL HIGH (ref 1.005–1.030)
UROBILINOGEN UA: 0.2 mg/dL (ref 0.0–1.0)
pH: 6 (ref 5.0–8.0)

## 2013-12-05 LAB — I-STAT ARTERIAL BLOOD GAS, ED
ACID-BASE DEFICIT: 2 mmol/L (ref 0.0–2.0)
ACID-BASE DEFICIT: 2 mmol/L (ref 0.0–2.0)
BICARBONATE: 26.4 meq/L — AB (ref 20.0–24.0)
Bicarbonate: 25.3 mEq/L — ABNORMAL HIGH (ref 20.0–24.0)
O2 SAT: 88 %
O2 Saturation: 85 %
PCO2 ART: 53.1 mmHg — AB (ref 35.0–45.0)
PCO2 ART: 57.7 mmHg — AB (ref 35.0–45.0)
PO2 ART: 58 mmHg — AB (ref 80.0–100.0)
Patient temperature: 98.6
TCO2: 27 mmol/L (ref 0–100)
TCO2: 28 mmol/L (ref 0–100)
pH, Arterial: 7.269 — ABNORMAL LOW (ref 7.350–7.450)
pH, Arterial: 7.287 — ABNORMAL LOW (ref 7.350–7.450)
pO2, Arterial: 62 mmHg — ABNORMAL LOW (ref 80.0–100.0)

## 2013-12-05 LAB — COMPREHENSIVE METABOLIC PANEL
ALT: 60 U/L — ABNORMAL HIGH (ref 0–35)
AST: 44 U/L — ABNORMAL HIGH (ref 0–37)
Albumin: 3.5 g/dL (ref 3.5–5.2)
Alkaline Phosphatase: 99 U/L (ref 39–117)
BUN: 16 mg/dL (ref 6–23)
CHLORIDE: 95 meq/L — AB (ref 96–112)
CO2: 21 meq/L (ref 19–32)
CREATININE: 0.84 mg/dL (ref 0.50–1.10)
Calcium: 9.5 mg/dL (ref 8.4–10.5)
GFR calc Af Amer: >90 mL/min (ref >90)
GFR, EST NON AFRICAN AMERICAN: 81 mL/min — AB (ref >90)
Glucose, Bld: 403 mg/dL — ABNORMAL HIGH (ref 70–99)
Potassium: 4.6 mEq/L (ref 3.7–5.3)
Sodium: 133 mEq/L — ABNORMAL LOW (ref 137–147)
Total Protein: 7.1 g/dL (ref 6.0–8.3)

## 2013-12-05 LAB — ETHANOL

## 2013-12-05 LAB — ACETAMINOPHEN LEVEL: Acetaminophen (Tylenol), Serum: <15 ug/mL (ref 10–30)

## 2013-12-05 LAB — TROPONIN I: Troponin I: <0.3 ng/mL (ref <0.30)

## 2013-12-05 LAB — SALICYLATE LEVEL: Salicylate Lvl: <2 mg/dL — ABNORMAL LOW (ref 2.8–20.0)

## 2013-12-05 LAB — PREGNANCY, URINE: PREG TEST UR: NEGATIVE

## 2013-12-05 MED ORDER — ONDANSETRON HCL 4 MG/2ML IJ SOLN
4.0000 mg | Freq: Once | INTRAMUSCULAR | Status: AC
  Administered 2013-12-05: 4 mg via INTRAVENOUS
  Filled 2013-12-05: qty 2

## 2013-12-05 MED ORDER — SODIUM CHLORIDE 0.9 % IV BOLUS (SEPSIS)
1000.0000 mL | Freq: Once | INTRAVENOUS | Status: AC
  Administered 2013-12-05: 1000 mL via INTRAVENOUS

## 2013-12-05 MED ORDER — METRONIDAZOLE IN NACL 5-0.79 MG/ML-% IV SOLN: 500.0000 mg | Freq: Once | INTRAVENOUS | Status: DC

## 2013-12-05 MED ORDER — METRONIDAZOLE 500 MG PO TABS
2000.0000 mg | ORAL_TABLET | Freq: Once | ORAL | Status: AC
  Administered 2013-12-06: 2000 mg via ORAL
  Filled 2013-12-05: qty 4

## 2013-12-05 MED ORDER — DEXTROSE 5 % IV SOLN: 1.0000 g | Freq: Once | INTRAVENOUS | Status: DC

## 2013-12-05 MED ORDER — DEXTROSE 5 % IV SOLN
1.0000 g | Freq: Once | INTRAVENOUS | Status: AC
  Administered 2013-12-06: 1 g via INTRAVENOUS
  Filled 2013-12-05: qty 10

## 2013-12-05 NOTE — ED Notes (Signed)
Patient took crack cocaine and heroine tonight at [id number] and 1930 respectively. Found sitting in papasan chair passed out with vomit on shirt and having been incontinent. Initially with snoring respirations. Patient recovered with positioning change and stimulation. EMS administered 4mg  IV zofran.

## 2013-12-05 NOTE — ED Provider Notes (Signed)
CSN: [id number]     Arrival date & time 12/05/13  2053 History   First MD Initiated Contact with Patient 12/05/13 2056     Chief Complaint  Patient presents with  . Drug Overdose     HPI  48 yo F with hx of diabetes (non compliant with medications because they are to expensive) , morbid obesity and HTN who is addicted to cocaine. Patient admits to using cocaine and heroin today and then was found by son unresponsive covered in emesis. On arrival patient is mentation well. She denies any CP, SOB, ABD pain. She dneies any SI/HI. She does endorse nausea.   Past Medical History  Diagnosis Date  . Diabetes mellitus without complication   . Hypertension    No past surgical history on file. No family history on file. History  Substance Use Topics  . Smoking status: Not on file  . Smokeless tobacco: Not on file  . Alcohol Use: Not on file   OB History   Grav Para Term Preterm Abortions TAB SAB Ect Mult Living                 Review of Systems  Constitutional: Negative for fever, chills, activity change and appetite change.  HENT: Negative for congestion, ear pain and rhinorrhea.   Eyes: Negative for pain.  Respiratory: Negative for cough and shortness of breath.   Cardiovascular: Negative for chest pain and palpitations.  Gastrointestinal: Positive for nausea and vomiting. Negative for abdominal pain.  Genitourinary: Negative for dysuria, difficulty urinating and pelvic pain.  Musculoskeletal: Negative for back pain and neck pain.  Skin: Negative for rash and wound.  Neurological: Positive for syncope. Negative for weakness and headaches.  Psychiatric/Behavioral: Negative for behavioral problems, confusion and agitation.      Allergies  Review of patient's allergies indicates no known allergies.  Home Medications   Current Outpatient Rx  Name  Route  Sig  Dispense  Refill  . aspirin EC 81 MG tablet   Oral   Take 81 mg by mouth daily.         . insulin detemir (LEVEMIR)  100 UNIT/ML injection   Subcutaneous   Inject 50 Units into the skin at bedtime.         . Multiple Vitamin (MULTI VITAMIN DAILY PO)   Oral   Take 1 tablet by mouth daily.         Marland Kitchen PARoxetine (PAXIL) 20 MG tablet   Oral   Take 20 mg by mouth daily.          BP 109/64  Pulse 87  Temp(Src) 97.9 F (36.6 C) (Oral)  Resp 14  SpO2 94% Physical Exam  Constitutional: She is oriented to person, place, and time. She appears well-developed and well-nourished. No distress.  HENT:  Head: Normocephalic and atraumatic.  Nose: Nose normal.  Mouth/Throat: Oropharynx is clear and moist.  Eyes: EOM are normal. Pupils are equal, round, and reactive to light.  Neck: Normal range of motion. Neck supple. No tracheal deviation present.  Cardiovascular: Normal rate, regular rhythm, normal heart sounds and intact distal pulses.   Pulmonary/Chest: Effort normal. She has rales ( RLL).  Abdominal: Soft. Bowel sounds are normal. She exhibits no distension. There is no tenderness. There is no rebound and no guarding.  Musculoskeletal: Normal range of motion. She exhibits no tenderness.  Neurological: She is alert and oriented to person, place, and time.  Skin: Skin is warm and dry. No rash noted.  Psychiatric: She has a normal mood and affect. Her behavior is normal.    ED Course  Procedures (including critical care time) Labs Review Labs Reviewed  CBC WITH DIFFERENTIAL - Abnormal; Notable for the following:    WBC 20.7 (*)    RBC 5.26 (*)    Hemoglobin 15.9 (*)    Neutrophils Relative % 85 (*)    Neutro Abs 17.5 (*)    Lymphocytes Relative 10 (*)    All other components within normal limits  COMPREHENSIVE METABOLIC PANEL - Abnormal; Notable for the following:    Sodium 133 (*)    Chloride 95 (*)    Glucose, Bld 403 (*)    AST 44 (*)    ALT 60 (*)    Total Bilirubin <0.2 (*)    GFR calc non Af Amer 81 (*)    All other components within normal limits  URINALYSIS, ROUTINE W REFLEX  MICROSCOPIC - Abnormal; Notable for the following:    APPearance TURBID (*)    Specific Gravity, Urine 1.034 (*)    Glucose, UA >1000 (*)    Hgb urine dipstick MODERATE (*)    Protein, ur 100 (*)    Leukocytes, UA MODERATE (*)    All other components within normal limits  SALICYLATE LEVEL - Abnormal; Notable for the following:    Salicylate Lvl <2.0 (*)    All other components within normal limits  URINE MICROSCOPIC-ADD ON - Abnormal; Notable for the following:    Squamous Epithelial / LPF FEW (*)    Bacteria, UA MANY (*)    All other components within normal limits  I-STAT ARTERIAL BLOOD GAS, ED - Abnormal; Notable for the following:    pH, Arterial 7.269 (*)    pCO2 arterial 57.7 (*)    pO2, Arterial 58.0 (*)    Bicarbonate 26.4 (*)    All other components within normal limits  I-STAT ARTERIAL BLOOD GAS, ED - Abnormal; Notable for the following:    pH, Arterial 7.287 (*)    pCO2 arterial 53.1 (*)    pO2, Arterial 62.0 (*)    Bicarbonate 25.3 (*)    All other components within normal limits  CBG MONITORING, ED - Abnormal; Notable for the following:    Glucose-Capillary 328 (*)    All other components within normal limits  PREGNANCY, URINE  TROPONIN I  ACETAMINOPHEN LEVEL  ETHANOL   Imaging Review Dg Chest Portable 1 View  12/05/2013   CLINICAL DATA:  Drug overdose  EXAM: PORTABLE CHEST - 1 VIEW  COMPARISON:  03/08/2007  FINDINGS: Reticular nodular markings at the bases. No asymmetric consolidation. No edema, effusion, or pneumothorax. Normal heart size. Upper mediastinal contours within normal limits for technique.  IMPRESSION: Increased markings at the bases. In the setting of overdose, question aspiration pneumonitis.   Electronically Signed   By: Tiburcio PeaJonathan  Watts M.D.   On: 12/05/2013 23:28     EKG Interpretation   Date/Time:  Wednesday December 05 2013 22:08:12 EST Ventricular Rate:  85 PR Interval:  163 QRS Duration: 100 QT Interval:  371 QTC Calculation: 441 R Axis:    69 Text Interpretation:  Sinus rhythm Minimal ST elevation, anterior leads No  significant change was found Confirmed by Manus GunningANCOUR  MD, STEPHEN [phone]([id number]) on  12/05/2013 10:47:27 PM      MDM   Final diagnoses:  Cocaine abuse  Nausea & vomiting  Aspiration into airway    48 yo morbidly obese who presents by EMS for OD on  crack and heroin. During ED course patient required non rebreather at 10 L to maintain saturations. Given acidosis and hypercarbia will start on BIPAP. Zofran given for nausea. CXR with concern for aspiration. Patient is likely developing pneumonitis from aspiration. Case co managed with my attending Dr. Manus Gunning. Plan for admission with hospitalist team.  Of note patient received dose of rocephin for UTI and flagyl for trichomonas.   AG of 17. Do not suspect DKA given lack of  Ketones in urine but will send for serum ketones and lactic acid. Will start on gentle insulin gtt to temporize the hyperglycemia. NS bolus 1 L x2 in ED.     Nadara Mustard, MD 12/06/13 [phone]

## 2013-12-06 ENCOUNTER — Encounter (HOSPITAL_COMMUNITY): Payer: Self-pay | Admitting: Internal Medicine

## 2013-12-06 ENCOUNTER — Inpatient Hospital Stay (HOSPITAL_COMMUNITY): Payer: Self-pay

## 2013-12-06 DIAGNOSIS — J690 Pneumonitis due to inhalation of food and vomit: Principal | ICD-10-CM

## 2013-12-06 DIAGNOSIS — F1410 Cocaine abuse, uncomplicated: Secondary | ICD-10-CM

## 2013-12-06 DIAGNOSIS — I517 Cardiomegaly: Secondary | ICD-10-CM

## 2013-12-06 DIAGNOSIS — R7989 Other specified abnormal findings of blood chemistry: Secondary | ICD-10-CM | POA: Diagnosis present

## 2013-12-06 DIAGNOSIS — F1910 Other psychoactive substance abuse, uncomplicated: Secondary | ICD-10-CM | POA: Diagnosis present

## 2013-12-06 DIAGNOSIS — T17908A Unspecified foreign body in respiratory tract, part unspecified causing other injury, initial encounter: Secondary | ICD-10-CM | POA: Insufficient documentation

## 2013-12-06 DIAGNOSIS — R945 Abnormal results of liver function studies: Secondary | ICD-10-CM

## 2013-12-06 DIAGNOSIS — R55 Syncope and collapse: Secondary | ICD-10-CM | POA: Diagnosis present

## 2013-12-06 DIAGNOSIS — R112 Nausea with vomiting, unspecified: Secondary | ICD-10-CM

## 2013-12-06 DIAGNOSIS — D72829 Elevated white blood cell count, unspecified: Secondary | ICD-10-CM | POA: Diagnosis present

## 2013-12-06 DIAGNOSIS — E119 Type 2 diabetes mellitus without complications: Secondary | ICD-10-CM | POA: Diagnosis present

## 2013-12-06 DIAGNOSIS — J9600 Acute respiratory failure, unspecified whether with hypoxia or hypercapnia: Secondary | ICD-10-CM | POA: Diagnosis present

## 2013-12-06 LAB — CBC WITH DIFFERENTIAL/PLATELET
BASOS ABS: 0 10*3/uL (ref 0.0–0.1)
Basophils Relative: 0 % (ref 0–1)
EOS ABS: 0 10*3/uL (ref 0.0–0.7)
Eosinophils Relative: 0 % (ref 0–5)
HCT: 40.9 % (ref 36.0–46.0)
Hemoglobin: 14 g/dL (ref 12.0–15.0)
LYMPHS PCT: 9 % — AB (ref 12–46)
Lymphs Abs: 2.3 10*3/uL (ref 0.7–4.0)
MCH: 29.7 pg (ref 26.0–34.0)
MCHC: 34.2 g/dL (ref 30.0–36.0)
MCV: 86.7 fL (ref 78.0–100.0)
Monocytes Absolute: 1 10*3/uL (ref 0.1–1.0)
Monocytes Relative: 4 % (ref 3–12)
NEUTROS PCT: 87 % — AB (ref 43–77)
Neutro Abs: 22.1 10*3/uL — ABNORMAL HIGH (ref 1.7–7.7)
PLATELETS: ADEQUATE 10*3/uL (ref 150–400)
RBC: 4.72 MIL/uL (ref 3.87–5.11)
RDW: 13.1 % (ref 11.5–15.5)
WBC Morphology: INCREASED
WBC: 25.4 10*3/uL — ABNORMAL HIGH (ref 4.0–10.5)

## 2013-12-06 LAB — GLUCOSE, CAPILLARY
GLUCOSE-CAPILLARY: 209 mg/dL — AB (ref 70–99)
Glucose-Capillary: 287 mg/dL — ABNORMAL HIGH (ref 70–99)
Glucose-Capillary: 275 mg/dL — ABNORMAL HIGH (ref 70–99)

## 2013-12-06 LAB — CBC
HCT: 41.6 % (ref 36.0–46.0)
Hemoglobin: 14.4 g/dL (ref 12.0–15.0)
MCH: 29.8 pg (ref 26.0–34.0)
MCHC: 34.6 g/dL (ref 30.0–36.0)
MCV: 86 fL (ref 78.0–100.0)
PLATELETS: 220 10*3/uL (ref 150–400)
RBC: 4.84 MIL/uL (ref 3.87–5.11)
RDW: 12.9 % (ref 11.5–15.5)
WBC: 27.1 10*3/uL — AB (ref 4.0–10.5)

## 2013-12-06 LAB — HEMOGLOBIN A1C
Hgb A1c MFr Bld: 11.3 % — ABNORMAL HIGH (ref <5.7)
Mean Plasma Glucose: 278 mg/dL — ABNORMAL HIGH (ref <117)

## 2013-12-06 LAB — TROPONIN I
Troponin I: <0.3 ng/mL (ref <0.30)
Troponin I: <0.3 ng/mL (ref <0.30)
Troponin I: <0.3 ng/mL (ref <0.30)

## 2013-12-06 LAB — COMPREHENSIVE METABOLIC PANEL
ALK PHOS: 82 U/L (ref 39–117)
ALT: 53 U/L — AB (ref 0–35)
AST: 36 U/L (ref 0–37)
Albumin: 3 g/dL — ABNORMAL LOW (ref 3.5–5.2)
BUN: 14 mg/dL (ref 6–23)
CHLORIDE: 98 meq/L (ref 96–112)
CO2: 21 meq/L (ref 19–32)
Calcium: 8.8 mg/dL (ref 8.4–10.5)
Creatinine, Ser: 0.65 mg/dL (ref 0.50–1.10)
GLUCOSE: 272 mg/dL — AB (ref 70–99)
POTASSIUM: 4.4 meq/L (ref 3.7–5.3)
SODIUM: 137 meq/L (ref 137–147)
Total Protein: 6 g/dL (ref 6.0–8.3)

## 2013-12-06 LAB — MRSA PCR SCREENING: MRSA by PCR: NEGATIVE

## 2013-12-06 LAB — CREATININE, SERUM
Creatinine, Ser: 0.64 mg/dL (ref 0.50–1.10)
GFR calc non Af Amer: >90 mL/min (ref >90)

## 2013-12-06 LAB — CBG MONITORING, ED: Glucose-Capillary: 328 mg/dL — ABNORMAL HIGH (ref 70–99)

## 2013-12-06 LAB — TSH: TSH: 0.972 u[IU]/mL (ref 0.350–4.500)

## 2013-12-06 LAB — PRO B NATRIURETIC PEPTIDE: Pro B Natriuretic peptide (BNP): 16.7 pg/mL (ref 0–125)

## 2013-12-06 LAB — KETONES, QUALITATIVE: Acetone, Bld: NEGATIVE

## 2013-12-06 MED ORDER — ONDANSETRON HCL 4 MG PO TABS: 4.0000 mg | ORAL_TABLET | Freq: Four times a day (QID) | ORAL | Status: DC | PRN

## 2013-12-06 MED ORDER — SODIUM CHLORIDE 0.9 % IV BOLUS (SEPSIS)
500.0000 mL | Freq: Once | INTRAVENOUS | Status: AC
  Administered 2013-12-06: 500 mL via INTRAVENOUS

## 2013-12-06 MED ORDER — SODIUM CHLORIDE 0.9 % IV SOLN
INTRAVENOUS | Status: DC
  Filled 2013-12-06: qty 1

## 2013-12-06 MED ORDER — ZOLPIDEM TARTRATE 5 MG PO TABS
5.0000 mg | ORAL_TABLET | Freq: Every day | ORAL | Status: DC
  Administered 2013-12-06: 5 mg via ORAL
  Filled 2013-12-06: qty 1

## 2013-12-06 MED ORDER — ACETAMINOPHEN 325 MG PO TABS
650.0000 mg | ORAL_TABLET | Freq: Four times a day (QID) | ORAL | Status: DC | PRN
  Administered 2013-12-06 – 2013-12-07 (×2): 650 mg via ORAL
  Filled 2013-12-06 (×2): qty 2

## 2013-12-06 MED ORDER — ASPIRIN EC 81 MG PO TBEC
81.0000 mg | DELAYED_RELEASE_TABLET | Freq: Every day | ORAL | Status: DC
  Administered 2013-12-06 – 2013-12-07 (×2): 81 mg via ORAL
  Filled 2013-12-06 (×3): qty 1

## 2013-12-06 MED ORDER — SODIUM CHLORIDE 0.9 % IV SOLN
INTRAVENOUS | Status: DC
  Administered 2013-12-06: 1 mL via INTRAVENOUS

## 2013-12-06 MED ORDER — INSULIN DETEMIR 100 UNIT/ML ~~LOC~~ SOLN: 50.0000 [IU] | Freq: Every day | SUBCUTANEOUS | Status: DC

## 2013-12-06 MED ORDER — ONDANSETRON HCL 4 MG/2ML IJ SOLN
4.0000 mg | Freq: Four times a day (QID) | INTRAMUSCULAR | Status: DC | PRN
  Administered 2013-12-06: 4 mg via INTRAVENOUS
  Filled 2013-12-06: qty 2

## 2013-12-06 MED ORDER — ENOXAPARIN SODIUM 40 MG/0.4ML ~~LOC~~ SOLN
40.0000 mg | SUBCUTANEOUS | Status: DC
  Administered 2013-12-06 – 2013-12-07 (×2): 40 mg via SUBCUTANEOUS
  Filled 2013-12-06 (×3): qty 0.4

## 2013-12-06 MED ORDER — INFLUENZA VAC SPLIT QUAD 0.5 ML IM SUSP
0.5000 mL | INTRAMUSCULAR | Status: AC
  Administered 2013-12-07: 0.5 mL via INTRAMUSCULAR
  Filled 2013-12-06: qty 0.5

## 2013-12-06 MED ORDER — INSULIN ASPART 100 UNIT/ML ~~LOC~~ SOLN: 5.0000 [IU] | Freq: Once | SUBCUTANEOUS | Status: DC

## 2013-12-06 MED ORDER — SODIUM CHLORIDE 0.9 % IV BOLUS (SEPSIS)
1000.0000 mL | Freq: Once | INTRAVENOUS | Status: AC
  Administered 2013-12-06: 1000 mL via INTRAVENOUS

## 2013-12-06 MED ORDER — INSULIN DETEMIR 100 UNIT/ML ~~LOC~~ SOLN
50.0000 [IU] | Freq: Every day | SUBCUTANEOUS | Status: DC
  Administered 2013-12-06 (×2): 50 [IU] via SUBCUTANEOUS
  Filled 2013-12-06 (×3): qty 0.5

## 2013-12-06 MED ORDER — SODIUM CHLORIDE 0.9 % IV SOLN
INTRAVENOUS | Status: DC
  Administered 2013-12-06 – 2013-12-07 (×4): via INTRAVENOUS

## 2013-12-06 MED ORDER — DEXTROSE 5 % IV SOLN
1.0000 g | INTRAVENOUS | Status: DC
  Administered 2013-12-07: 1 g via INTRAVENOUS
  Filled 2013-12-06 (×2): qty 10

## 2013-12-06 MED ORDER — ACETAMINOPHEN 650 MG RE SUPP: 650.0000 mg | Freq: Four times a day (QID) | RECTAL | Status: DC | PRN

## 2013-12-06 MED ORDER — SODIUM CHLORIDE 0.9 % IJ SOLN
3.0000 mL | Freq: Two times a day (BID) | INTRAMUSCULAR | Status: DC
Start: 2013-12-06 — End: 2013-12-07

## 2013-12-06 MED ORDER — METRONIDAZOLE IN NACL 5-0.79 MG/ML-% IV SOLN
500.0000 mg | Freq: Three times a day (TID) | INTRAVENOUS | Status: DC
  Administered 2013-12-06 – 2013-12-07 (×4): 500 mg via INTRAVENOUS
  Filled 2013-12-06 (×8): qty 100

## 2013-12-06 MED ORDER — PAROXETINE HCL 20 MG PO TABS
20.0000 mg | ORAL_TABLET | Freq: Every day | ORAL | Status: DC
  Administered 2013-12-06 – 2013-12-07 (×2): 20 mg via ORAL
  Filled 2013-12-06 (×3): qty 1

## 2013-12-06 MED ORDER — PNEUMOCOCCAL VAC POLYVALENT 25 MCG/0.5ML IJ INJ
0.5000 mL | INJECTION | INTRAMUSCULAR | Status: AC
  Administered 2013-12-07: 0.5 mL via INTRAMUSCULAR
  Filled 2013-12-06: qty 0.5

## 2013-12-06 MED ORDER — INSULIN ASPART 100 UNIT/ML ~~LOC~~ SOLN
5.0000 [IU] | Freq: Once | SUBCUTANEOUS | Status: AC
  Administered 2013-12-06: 5 [IU] via INTRAVENOUS
  Filled 2013-12-06: qty 1

## 2013-12-06 MED ORDER — INSULIN ASPART 100 UNIT/ML ~~LOC~~ SOLN
0.0000 [IU] | Freq: Three times a day (TID) | SUBCUTANEOUS | Status: DC
  Administered 2013-12-06: 5 [IU] via SUBCUTANEOUS
  Administered 2013-12-06: 3 [IU] via SUBCUTANEOUS
  Administered 2013-12-07: 2 [IU] via SUBCUTANEOUS
  Administered 2013-12-07: 3 [IU] via SUBCUTANEOUS

## 2013-12-06 NOTE — Progress Notes (Addendum)
[facility] Progress Note [place] TEAM 1 - Stepdown/ICU TEAM   Claudia Erickson WUJ:[id number] DOB: 01/26/1966 DOA: 12/05/2013 PCP: No primary provider on file.  Brief narrative: Claudia Bickersllison B Wollen is a 48 y.o. female presenting on 12/05/2013 with a history of polysubstance abuse including cocaine and Heroin was found to be unresponsive by patient's son after patient to a dose of heroin. Patient was found lying on her vomitus. Time patient reached ER patient was alert and awake and denied any chest pain shortness of breath or any productive cough fever chills or any loss of function of upper or lower extremities. Patient was found to be hypoxic and initially was placed on nonrebreather. Chest x-ray shows possible aspiration and CBG shows initially mild hypercarbia. In attempt to wean off oxygen patient was easily getting hypoxic.  Subjective: No complaints- wondering why she is still in the hospital. States she does not regularly do heroin and this was the third time she has used it. She is a regular user of Cocaine, however.  Discussed trichomonas on UA- pt states she has not had intercourse if years. No other complaints.   Assessment/Plan: Principal Problem:   Acute respiratory failure--  Aspiration pneumonia-- -cont current antibiotics and follow WBC count - follow blood cx  Active Problems:   Syncope - follow on echo- maintain tele monitoring- troponins negative   Leucocytosis - possible aspiration pneumonia and also UTI - f/u cultures  Trichomoniasis - has been treated with Flagyl 2 gm  Dehydration - hydrate aggressively and follow  Metabolic acidosis - not in DKA- ? Lactic acidosis- lactic acid not checked    Polysubstance abuse - advised to quit    Diabetes mellitus - on home dose of Levemir which she received this AM - cont to follow -     Elevated LFTs - recheck in AM    Code Status: Full code Family Communication: none Disposition Plan: home tomorrow if  stable  Consultants: none  Procedures: none  Antibiotics: Antibiotics Given (last 72 hours)   Date/Time Action Medication Dose Rate   12/06/13 0122 Given   metroNIDAZOLE (FLAGYL) tablet 2,000 mg 2,000 mg    12/06/13 0324 Given   metroNIDAZOLE (FLAGYL) IVPB 500 mg 500 mg 100 mL/hr       DVT prophylaxis: Lovenox  Objective: Filed Weights   12/06/13 0215  Weight: 153 kg (337 lb 4.9 oz)   Blood pressure 108/65, pulse 79, temperature 98.1 F (36.7 C), temperature source Oral, resp. rate 13, height 5\' 9"  (1.753 m), weight 153 kg (337 lb 4.9 oz), SpO2 96.00%.  Intake/Output Summary (Last 24 hours) at 12/06/13 1254 Last data filed at 12/06/13 0730  Gross per 24 hour  Intake   1129 ml  Output    800 ml  Net    329 ml     Exam: General: No acute respiratory distress Lungs: Clear to auscultation bilaterally without wheezes or crackles Cardiovascular: Regular rate and rhythm without murmur gallop or rub normal S1 and S2 Abdomen: Nontender, nondistended, soft, bowel sounds positive, no rebound, no ascites, no appreciable mass Extremities: No significant cyanosis, clubbing, or edema bilateral lower extremities  Data Reviewed: Basic Metabolic Panel:  Recent Labs Lab 12/05/13 2112 12/06/13 0309  NA 133* 137  K 4.6 4.4  CL 95* 98  CO2 21 21  GLUCOSE 403* 272*  BUN 16 14  CREATININE 0.84 0.65  0.64  CALCIUM 9.5 8.8   Liver Function Tests:  Recent Labs Lab 12/05/13 2112 12/06/13 0309  AST 44* 36  ALT 60* 53*  ALKPHOS 99 82  BILITOT <0.2* <0.2*  PROT 7.1 6.0  ALBUMIN 3.5 3.0*   No results found for this basename: LIPASE, AMYLASE,  in the last 168 hours No results found for this basename: AMMONIA,  in the last 168 hours CBC:  Recent Labs Lab 12/05/13 2112 12/06/13 0309  WBC 20.7* 25.4*  27.1*  NEUTROABS 17.5* 22.1*  HGB 15.9* 14.0  14.4  HCT 45.4 40.9  41.6  MCV 86.3 86.7  86.0  PLT 238 PLATELET CLUMPS NOTED ON SMEAR, COUNT APPEARS ADEQUATE   220   Cardiac Enzymes:  Recent Labs Lab 12/05/13 2252 12/06/13 0309 12/06/13 0840  TROPONINI <0.30 <0.30 <0.30   BNP (last 3 results)  Recent Labs  12/06/13 0309  PROBNP 16.7   CBG:  Recent Labs Lab 12/06/13 0017 12/06/13 1130  GLUCAP 328* 287*    Recent Results (from the past 240 hour(s))  MRSA PCR SCREENING     Status: None   Collection Time    12/06/13  2:25 AM      Result Value Ref Range Status   MRSA by PCR NEGATIVE  NEGATIVE Final   Comment:            The GeneXpert MRSA Assay (FDA     approved for NASAL specimens     only), is one component of a     comprehensive MRSA colonization     surveillance program. It is not     intended to diagnose MRSA     infection nor to guide or     monitor treatment for     MRSA infections.     Studies:  Recent x-ray studies have been reviewed in detail by the Attending Physician  Scheduled Meds:  Scheduled Meds: . aspirin EC  81 mg Oral Daily  . [START ON 12/07/2013] cefTRIAXone (ROCEPHIN)  IV  1 g Intravenous Q24H  . enoxaparin (LOVENOX) injection  40 mg Subcutaneous Q24H  . [START ON 12/07/2013] influenza vac split quadrivalent PF  0.5 mL Intramuscular Tomorrow-1000  . insulin aspart  0-9 Units Subcutaneous TID WC  . insulin detemir  50 Units Subcutaneous QHS  . metronidazole  500 mg Intravenous Q8H  . PARoxetine  20 mg Oral Daily  . [START ON 12/07/2013] pneumococcal 23 valent vaccine  0.5 mL Intramuscular Tomorrow-1000  . sodium chloride  3 mL Intravenous Q12H   Continuous Infusions: . sodium chloride 20 mL/hr at 12/06/13 0600  . sodium chloride 125 mL/hr at 12/06/13 1045    Time spent on care of this patient: >35   Calvert Cantor, MD  [facility] Office  [phone] Pager - Text Page per Amion as per below:  On-Call/Text Page:      [email or web address]  If 7PM-7AM, please contact night-coverage [email or web address] 12/06/2013, 12:54 PM   LOS: 1 day

## 2013-12-06 NOTE — Progress Notes (Signed)
Patient transferred from 2 heart with aspiration pneumonia. VSS. O2 sat 96% at 2L.

## 2013-12-06 NOTE — ED Provider Notes (Signed)
I saw and evaluated the patient, reviewed the resident's note and I agree with the findings and plan. If applicable, I agree with the resident's interpretation of the EKG.  If applicable, I was present for critical portions of any procedures performed.  Unresponsive episode after using cocaine and heroin, vomiting.  Now awake and alert.  Lungs clear. Denies SI. Hypoxia on attempt removal of oxygen.  Respiratory acidosis on ABG, hyperglycemia with possible early DKA as well. Respiratory failure 2/2 aspiration, needs continuous O2.   Glynn OctaveStephen Soffia Doshier, MD 12/06/13 1118

## 2013-12-06 NOTE — ED Notes (Signed)
CBG-328. Notified RN

## 2013-12-06 NOTE — Progress Notes (Addendum)
ANTIBIOTIC CONSULT NOTE - INITIAL  Pharmacy Consult for Ceftriaxone (Flagyl per MD) Indication: Possible Aspiration PNA  No Known Allergies  Patient Measurements: Height: 5\' 9"  (175.3 cm) Weight: 337 lb 4.9 oz (153 kg) IBW/kg (Calculated) : 66.2  Vital Signs: Temp: 98.6 F (37 C) (03/05 0215) Temp src: Oral (03/05 0215) BP: 105/78 mmHg (03/05 0215) Pulse Rate: 95 (03/05 0215) Intake/Output from previous day: 03/04 0701 - 03/05 0700 In: 240 [P.O.:240] Out: 100 [Urine:100] Intake/Output from this shift: Total I/O In: 240 [P.O.:240] Out: 100 [Urine:100]  Labs:  Recent Labs  12/05/13 2112  WBC 20.7*  HGB 15.9*  PLT 238  CREATININE 0.84   Estimated Creatinine Clearance: 130.5 ml/min (by C-G formula based on Cr of 0.84). No results found for this basename: VANCOTROUGH, VANCOPEAK, VANCORANDOM, GENTTROUGH, GENTPEAK, GENTRANDOM, TOBRATROUGH, TOBRAPEAK, TOBRARND, AMIKACINPEAK, AMIKACINTROU, AMIKACIN,  in the last 72 hours   Microbiology: No results found for this or any previous visit (from the past 720 hour(s)).  Medical History: Past Medical History  Diagnosis Date  . Diabetes mellitus without complication   . Hypertension     Assessment: 48 y/o F to start antibiotics for possible aspiration PNA. WBC 20.7, renal function good, other labs as above.   Goal of Therapy:  Clinical resolution   Plan:  -Ceftriaxone 1g IV q24h -Flagyl per MD -Trend WBC, temp, cultures, imaging  Abran DukeLedford, James 12/06/2013,3:07 AM  Addendum  Neither rocephin or flagyl need renal adjustment so RX will sign off

## 2013-12-06 NOTE — Progress Notes (Signed)
  Echocardiogram 2D Echocardiogram has been performed.  Arvil ChacoFoster, Hovanes Hymas 12/06/2013, 3:14 PM

## 2013-12-06 NOTE — Progress Notes (Signed)
Hold off on BIPAP for now per MD. Pt. Isn't in any respiratory distress at this time. Will place on BIPAP if needed.

## 2013-12-06 NOTE — Progress Notes (Signed)
Clinical Social Work Department BRIEF PSYCHOSOCIAL ASSESSMENT 12/06/2013  Patient:  Claudia Erickson,Claudia Erickson     Account Number:  [id number]     Admit date:  12/05/2013  Clinical Social Worker:  Carren RangPURITZ,Densel Kronick, LCSWA  Date/Time:  12/06/2013 03:05 PM  Referred by:  Physician  Date Referred:  12/06/2013 Referred for  Substance Abuse   Other Referral:   Interview type:  Patient Other interview type:    PSYCHOSOCIAL DATA Living Status:  FAMILY Admitted from facility:   Level of care:   Primary support name:  Claudia Erickson Primary support relationship to patient:   Degree of support available:    CURRENT CONCERNS Current Concerns  Substance Abuse   Other Concerns:    SOCIAL WORK ASSESSMENT / PLAN CSW received consult to speak to patient about current substance use. CSW went into room and introduced self to patient and explained reason for visit. Patient was receptive to Child psychotherapistsocial worker visit. Patient states that she does have a long term problem with cocaine, but "this was her third time using heroin." Patient states this was a wake up call for her and she is wanting to stop using. CSW provided emotional support and empathetic listening. Social worker explored patient's support group. Patient states she lives with her mom and her son, but her son uses as well. Patient stated that she thinks this was a wake up call for her and her son and thinks that counseling would be a good idea for them to go together. CSW provided patient with resources for counseling and substance use resources. Patient thanked Child psychotherapistsocial worker for visit.   Assessment/plan status:  [facility] to [facility] Other assessment/ plan:   Information/referral to community resources:   Counseling services/substance use services    PATIENT'S/FAMILY'S RESPONSE TO PLAN OF CARE: Patient states she has had long term anxiety and depression and states that "she was not trying to commit suicide, but if she were to die, she  would be okay with it." Patient states she is not suicidal.       Claudia Erickson, MSW, [employer]CSWA [phone]

## 2013-12-06 NOTE — H&P (Signed)
[facility] History and Physical  Claudia Erickson Est[place] AVW:[id number] DOB: 08/23/1966 DOA: 12/05/2013  Referring physician: ER physician. PCP: No primary provider on file. [place].  Chief Complaint: Patient was found unresponsive.  HPI: Claudia Erickson Rief is a 48 y.o. female history of polysubstance abuse including cocaine and Heroin was found to be unresponsive by patient's son after patient to a dose of heroin. Patient was found lying on her vomitus. Time patient reached ER patient was alert and awake and denied any chest pain shortness of breath or any productive cough fever chills or any loss of function of upper or lower extremities. Patient was found to be hypoxic and initially was placed on nonrebreather. Chest x-ray shows possible aspiration and CBG shows initially mild hypercarbia. In attempt to wean off oxygen patient was easily getting hypoxic. Patient on my exam is not in acute distress but still requiring oxygen. Patient is also found to be hyperglycemic with uncontrolled diabetes.    Review of Systems: As presented in the history of presenting illness, rest negative.  Past Medical History  Diagnosis Date  . Diabetes mellitus without complication   . Hypertension    Past Surgical History  Procedure Laterality Date  . Abdominal hysterectomy    . Cholecystectomy    . Rotator cuff repair     Social History:  reports that she has been smoking.  She does not have any smokeless tobacco history on file. She reports that she uses illicit drugs (Cocaine). She reports that she does not drink alcohol. Where does patient live home. Can patient participate in ADLs? Yes.  No Known Allergies  Family History:  Family History  Problem Relation Age of Onset  . Diabetes Mellitus II Father   . CAD Neg Hx       Prior to Admission medications   Medication Sig Start Date End Date Taking? Authorizing Provider  aspirin EC 81 MG tablet Take 81 mg by  mouth daily.   Yes Historical Provider, MD  insulin detemir (LEVEMIR) 100 UNIT/ML injection Inject 50 Units into the skin at bedtime.   Yes Historical Provider, MD  Multiple Vitamin (MULTI VITAMIN DAILY PO) Take 1 tablet by mouth daily.   Yes Historical Provider, MD  PARoxetine (PAXIL) 20 MG tablet Take 20 mg by mouth daily.   Yes Historical Provider, MD    Physical Exam: Filed Vitals:   12/05/13 2145 12/05/13 2215 12/05/13 2239 12/06/13 0013  BP: 101/71 105/61  109/64  Pulse: 89 91 88 87  Temp:    97.9 F (36.6 C)  TempSrc:    Oral  Resp: 18 17 15 14   SpO2: 95% 98% 97% 94%     General:  Well-developed and nourished.  Eyes: Anicteric no pallor.  ENT: No discharge from the ears eyes nose mouth.  Neck: No mass felt.  Cardiovascular: S1-S2 heard.  Respiratory: No rhonchi or crepitations.  Abdomen: Soft nontender bowel sounds present. No guarding or rigidity.  Skin: No rash.  Musculoskeletal: No edema.  Psychiatric: Appears normal. Denies any suicidal ideation.  Neurologic: Alert awake oriented to time place and person. Moves all extremities.  Labs on Admission:  Basic Metabolic Panel:  Recent Labs Lab 12/05/13 2112  NA 133*  K 4.6  CL 95*  CO2 21  GLUCOSE 403*  BUN 16  CREATININE 0.84  CALCIUM 9.5   Liver Function Tests:  Recent Labs Lab 12/05/13 2112  AST 44*  ALT 60*  ALKPHOS 99  BILITOT <0.2*  PROT 7.1  ALBUMIN 3.5   No results found for this basename: LIPASE, AMYLASE,  in the last 168 hours No results found for this basename: AMMONIA,  in the last 168 hours CBC:  Recent Labs Lab 12/05/13 2112  WBC 20.7*  NEUTROABS 17.5*  HGB 15.9*  HCT 45.4  MCV 86.3  PLT 238   Cardiac Enzymes:  Recent Labs Lab 12/05/13 2252  TROPONINI <0.30    BNP (last 3 results) No results found for this basename: PROBNP,  in the last 8760 hours CBG:  Recent Labs Lab 12/06/13 0017  GLUCAP 328*    Radiological Exams on Admission: Dg Chest Portable  1 View  12/05/2013   CLINICAL DATA:  Drug overdose  EXAM: PORTABLE CHEST - 1 VIEW  COMPARISON:  03/08/2007  FINDINGS: Reticular nodular markings at the bases. No asymmetric consolidation. No edema, effusion, or pneumothorax. Normal heart size. Upper mediastinal contours within normal limits for technique.  IMPRESSION: Increased markings at the bases. In the setting of overdose, question aspiration pneumonitis.   Electronically Signed   By: Tiburcio Pea M.D.   On: 12/05/2013 23:28    EKG: Independently reviewed. Normal sinus rhythm with nonspecific ST changes in the anterior leads. No old EKG to compare.  Assessment/Plan Principal Problem:   Aspiration pneumonia Active Problems:   Polysubstance abuse   Diabetes mellitus   Syncope   1. Acute respiratory failure secondary to aspiration pneumonitis - continue with ceftriaxone and Flagyl. Closely observe. Check BNP. Check 2-D echo. Repeat chest x-ray in a.m. 2. Uncontrolled diabetes mellitus - I do not think patient is in DKA. I have ordered patient's home dose of Levemir. Closely follow CBGs and metabolic panel. If repeat metabolic panel shows persistent elevation of anion gap then may need to start patient on IV insulin infusion. 3. Syncope probably from nausea vomiting secondary to drug abuse - closely follow intermittently for any arrhythmias. Check 2-D echo. 4. Leukocytosis - probably reactionary to aspiration. Follow urinalysis. Patient is on ceftriaxone and Flagyl. 5. Polysubstance abuse - Child psychotherapist consult.    Code Status: Full code.  Family Communication: None.  Disposition Plan: Admit for inpatient.    Basia Mcginty N. [facility] Pager [phone].  If 7PM-7AM, please contact night-coverage [email or web address] Password [email or web address] 12/06/2013, 1:38 AM

## 2013-12-06 NOTE — Progress Notes (Addendum)
Inpatient Diabetes Program Recommendations  AACE/ADA: New Consensus Statement on Inpatient Glycemic Control (2013)  Target Ranges:  Prepandial:   less than 140 mg/dL      Peak postprandial:   less than 180 mg/dL (1-2 hours)      Critically ill patients:  140 - 180 mg/dL   Reason for Visit: CBGs greater than 180 mg/dl  Diabetes history:  Outpatient Diabetes medications: Lantus 50 units daily. Current orders for Inpatient glycemic control: Lantus 50 units daily, Novolog SENSITIVE correction scale TID   Note: Admitted with respiratory failure, aspiration pneumonia, polysubstance abuse CBGs continuing to be greater than 180 mg/dl.  Consider increasing Novolog correction scale to MODERATE  TID & HS.  Will continue to follow. Smith MinceKendra Gwynevere Lizana RN BSN CDE

## 2013-12-07 DIAGNOSIS — F1910 Other psychoactive substance abuse, uncomplicated: Secondary | ICD-10-CM

## 2013-12-07 LAB — COMPREHENSIVE METABOLIC PANEL
ALT: 42 U/L — ABNORMAL HIGH (ref 0–35)
AST: 25 U/L (ref 0–37)
Albumin: 2.8 g/dL — ABNORMAL LOW (ref 3.5–5.2)
Alkaline Phosphatase: 92 U/L (ref 39–117)
BUN: 8 mg/dL (ref 6–23)
CO2: 24 mEq/L (ref 19–32)
Calcium: 8.7 mg/dL (ref 8.4–10.5)
Chloride: 102 mEq/L (ref 96–112)
Creatinine, Ser: 0.64 mg/dL (ref 0.50–1.10)
GFR calc Af Amer: >90 mL/min (ref >90)
GFR calc non Af Amer: >90 mL/min (ref >90)
Glucose, Bld: 213 mg/dL — ABNORMAL HIGH (ref 70–99)
Potassium: 4.1 mEq/L (ref 3.7–5.3)
Sodium: 140 mEq/L (ref 137–147)
Total Bilirubin: <0.2 mg/dL — ABNORMAL LOW (ref 0.3–1.2)
Total Protein: 6.1 g/dL (ref 6.0–8.3)

## 2013-12-07 LAB — GLUCOSE, CAPILLARY
Glucose-Capillary: 183 mg/dL — ABNORMAL HIGH (ref 70–99)
Glucose-Capillary: 241 mg/dL — ABNORMAL HIGH (ref 70–99)

## 2013-12-07 LAB — CBC
HEMATOCRIT: 41.9 % (ref 36.0–46.0)
Hemoglobin: 13.9 g/dL (ref 12.0–15.0)
MCH: 29.3 pg (ref 26.0–34.0)
MCHC: 33.2 g/dL (ref 30.0–36.0)
MCV: 88.2 fL (ref 78.0–100.0)
Platelets: 210 10*3/uL (ref 150–400)
RBC: 4.75 MIL/uL (ref 3.87–5.11)
RDW: 13.2 % (ref 11.5–15.5)
WBC: 11.6 10*3/uL — ABNORMAL HIGH (ref 4.0–10.5)

## 2013-12-07 MED ORDER — MOXIFLOXACIN HCL 400 MG PO TABS: 400.0000 mg | ORAL_TABLET | Freq: Every day | ORAL | Status: DC

## 2013-12-07 MED ORDER — INSULIN ASPART 100 UNIT/ML ~~LOC~~ SOLN: 0.0000 [IU] | Freq: Three times a day (TID) | SUBCUTANEOUS | Status: DC

## 2013-12-07 NOTE — Discharge Summary (Signed)
Physician Discharge Summary  Claudia Erickson:[id number] DOB: 1966/04/30 DOA: 12/05/2013  PCP: No primary provider on file.  Admit date: 12/05/2013 Discharge date: 12/07/2013  Time spent: Less than 30 minutes  Recommendations for Outpatient Follow-up:  1. PCP([facility]) in 1 week. CM was to call patient with appointment. 2. Recommend followup of chest x-ray in 4-6 weeks to ensure resolution of pneumonia findings.  Discharge Diagnoses:  Principal Problem:   Acute respiratory failure Active Problems:   Aspiration pneumonia   Polysubstance abuse   Diabetes mellitus   Syncope   Leucocytosis   Elevated LFTs   Discharge Condition: Improved & Stable  Diet recommendation: Diabetic and Heart Healthy diet.  Filed Weights   12/06/13 0215 12/07/13 0500  Weight: 153 kg (337 lb 4.9 oz) 154 kg (339 lb 8.1 oz)    History of present illness:  Claudia Erickson is a 48 y.o. female presenting on 12/05/2013 with a history of polysubstance abuse including cocaine and Heroin who was found to be unresponsive by patient's son after patient OD on heroin. Patient was found lying on her vomitus. By the time patient reached ER patient was alert and awake and denied any chest pain shortness of breath or any productive cough fever chills or any loss of function of upper or lower extremities. Patient was found to be hypoxic and initially was placed on nonrebreather. Chest x-ray shows possible aspiration and ABG shows initially mild hypercarbia.   Hospital Course:   1. Acute respiratory failure: Secondary to aspiration pneumonia in the context of drug overdose. Resolved. 2. Aspiration pneumonia: She was treated with IV Rocephin and Flagyl in the hospital but will be discharged on oral Avelox to complete total 7 days treatment. Stable. 3. Syncope: Secondary to drug overdose. Telemetry without arrhythmias. Troponins negative. 4. Leukocytosis: Secondary to stress and pneumonia. Almost  normalized. 5. Trichomoniasis: Treated with Flagyl 2 g 6. Dehydration: Resolved after hydration 7. Metabolic acidosis:? Secondary to lactic acidosis. Resolved 8. Polysubstance abuse: Cessation counseled. 9. Uncontrolled diabetes mellitus: Levemir increased to 50 units now at bedtime and added SSI. 10. Abnormal LFTs: Improved   Consultations:  None  Procedures:  None    Discharge Exam:  Complaints:  Patient denied complaints. She denied cough, dyspnea, chest pain, dizziness or lightheadedness.  Filed Vitals:   12/06/13 1300 12/06/13 2115 12/07/13 0354 12/07/13 0500  BP: 115/76 110/81 123/72   Pulse: 80 75 74   Temp: 97.6 F (36.4 C) 98.4 F (36.9 C) 98 F (36.7 C)   TempSrc: Oral Oral Oral   Resp: 16 18 18    Height:      Weight:    154 kg (339 lb 8.1 oz)  SpO2: 97% 99% 97%     General exam: Pleasant and comfortable young female sitting up in bed in no distress. Respiratory system: Clear. No increased work of breathing. Cardiovascular system: S1 & S2 heard, RRR. No JVD, murmurs, gallops, clicks or pedal edema. Gastrointestinal system: Abdomen is nondistended, soft and nontender. Normal bowel sounds heard. Central nervous system: Alert and oriented. No focal neurological deficits. Extremities: Symmetric 5 x 5 power.  Discharge Instructions      Discharge Orders   Future Orders Complete By Expires   Call MD for:  difficulty breathing, headache or visual disturbances  As directed    Call MD for:  temperature >100.4  As directed    Diet - low sodium heart healthy  As directed    Diet Carb Modified  As directed    Increase activity slowly  As directed        Medication List         aspirin EC 81 MG tablet  Take 81 mg by mouth daily.     insulin aspart 100 UNIT/ML injection  Commonly known as:  novoLOG  - Inject 0-9 Units into the skin 3 (three) times daily with meals. CBG < 70: eat or drink something sweet and recheck,  - CBG 70 - 120: 0 units  - CBG  121 - 150: 1 unit  - CBG 151 - 200: 2 units  - CBG 201 - 250: 3 units  - CBG 251 - 300: 5 units  - CBG 301 - 350: 7 units  - CBG 351 - 400: 9 units  - CBG > 400: call MD     insulin detemir 100 UNIT/ML injection  Commonly known as:  LEVEMIR  Inject 50 Units into the skin at bedtime.     moxifloxacin 400 MG tablet  Commonly known as:  AVELOX  Take 1 tablet (400 mg total) by mouth daily.     MULTI VITAMIN DAILY PO  Take 1 tablet by mouth daily.     PARoxetine 20 MG tablet  Commonly known as:  PAXIL  Take 20 mg by mouth daily.          The results of significant diagnostics from this hospitalization (including imaging, microbiology, ancillary and laboratory) are listed below for reference.    Significant Diagnostic Studies: Dg Chest Port 1 View  12/06/2013   CLINICAL DATA:  Patient found unresponsive.  EXAM: PORTABLE CHEST - 1 VIEW  COMPARISON:  DG CHEST 1V PORT dated 12/05/2013; DG CHEST 2 VIEW dated 03/08/2007  FINDINGS: Improved aeration noted bilaterally with some residual mild atelectasis present at the bases. No progressive infiltrate/consolidation is identified. There is no evidence of pulmonary edema, pneumothorax, nodule or pleural fluid. The heart size is stable and normal.  IMPRESSION: Improved aeration bilaterally with bibasilar atelectasis present.   Electronically Signed   By: Irish Lack M.D.   On: 12/06/2013 08:32   Dg Chest Portable 1 View  12/05/2013   CLINICAL DATA:  Drug overdose  EXAM: PORTABLE CHEST - 1 VIEW  COMPARISON:  03/08/2007  FINDINGS: Reticular nodular markings at the bases. No asymmetric consolidation. No edema, effusion, or pneumothorax. Normal heart size. Upper mediastinal contours within normal limits for technique.  IMPRESSION: Increased markings at the bases. In the setting of overdose, question aspiration pneumonitis.   Electronically Signed   By: Tiburcio Pea M.D.   On: 12/05/2013 23:28    Microbiology: Recent Results (from the past  240 hour(s))  MRSA PCR SCREENING     Status: None   Collection Time    12/06/13  2:25 AM      Result Value Ref Range Status   MRSA by PCR NEGATIVE  NEGATIVE Final   Comment:            The GeneXpert MRSA Assay (FDA     approved for NASAL specimens     only), is one component of a     comprehensive MRSA colonization     surveillance program. It is not     intended to diagnose MRSA     infection nor to guide or     monitor treatment for     MRSA infections.  CULTURE, BLOOD (ROUTINE X 2)     Status: None   Collection Time  12/06/13  8:40 AM      Result Value Ref Range Status   Specimen Description BLOOD LEFT ARM   Final   Special Requests BOTTLES DRAWN AEROBIC AND ANAEROBIC [id number]   Final   Culture  Setup Time     Final   Value: 12/06/2013 14:50     Performed at [facility]   Culture     Final   Value:        BLOOD CULTURE RECEIVED NO GROWTH TO DATE CULTURE WILL BE HELD FOR 5 DAYS BEFORE ISSUING A FINAL NEGATIVE REPORT     Performed at [facility]   Report Status PENDING   Incomplete  CULTURE, BLOOD (ROUTINE X 2)     Status: None   Collection Time    12/06/13  8:45 AM      Result Value Ref Range Status   Specimen Description BLOOD LEFT HAND   Final   Special Requests BOTTLES DRAWN AEROBIC ONLY 4CC   Final   Culture  Setup Time     Final   Value: 12/06/2013 14:50     Performed at [facility]   Culture     Final   Value:        BLOOD CULTURE RECEIVED NO GROWTH TO DATE CULTURE WILL BE HELD FOR 5 DAYS BEFORE ISSUING A FINAL NEGATIVE REPORT     Performed at [facility]   Report Status PENDING   Incomplete     Labs: Basic Metabolic Panel:  Recent Labs Lab 12/05/13 2112 12/06/13 0309 12/07/13 0330  NA 133* 137 140  K 4.6 4.4 4.1  CL 95* 98 102  CO2 21 21 24   GLUCOSE 403* 272* 213*  BUN 16 14 8   CREATININE 0.84 0.65  0.64 0.64  CALCIUM 9.5 8.8 8.7   Liver Function Tests:  Recent Labs Lab 12/05/13 2112 12/06/13 0309  12/07/13 0330  AST 44* 36 25  ALT 60* 53* 42*  ALKPHOS 99 82 92  BILITOT <0.2* <0.2* <0.2*  PROT 7.1 6.0 6.1  ALBUMIN 3.5 3.0* 2.8*   No results found for this basename: LIPASE, AMYLASE,  in the last 168 hours No results found for this basename: AMMONIA,  in the last 168 hours CBC:  Recent Labs Lab 12/05/13 2112 12/06/13 0309 12/07/13 0330  WBC 20.7* 25.4*  27.1* 11.6*  NEUTROABS 17.5* 22.1*  --   HGB 15.9* 14.0  14.4 13.9  HCT 45.4 40.9  41.6 41.9  MCV 86.3 86.7  86.0 88.2  PLT 238 PLATELET CLUMPS NOTED ON SMEAR, COUNT APPEARS ADEQUATE  220 210   Cardiac Enzymes:  Recent Labs Lab 12/05/13 2252 12/06/13 0309 12/06/13 0840 12/06/13 1410  TROPONINI <0.30 <0.30 <0.30 <0.30   BNP: BNP (last 3 results)  Recent Labs  12/06/13 0309  PROBNP 16.7   CBG:  Recent Labs Lab 12/06/13 1130 12/06/13 1628 12/06/13 2113 12/07/13 0654 12/07/13 1120  GLUCAP 287* 209* 275* 183* 241*    Additional labs: 1. ABG on 12/05/13: PH 7.287, PCO2 53, PO2 62, bicarbonate 25 and oxygen saturation 88% 2. Salicylate level <2, acetaminophen level <15 3. Hemoglobin A1c: 11.3 4. Urine pregnancy test: Negative 5. TSH: 0.972 6. Serum acetone: Negative 7. Blood alcohol level <11 8. Final blood culture results: Negative 9. 2-D echo 12/06/13: Study Conclusions  - Left ventricle: The cavity size was normal. Wall thickness was normal. Systolic function was normal. The estimated ejection fraction was in the range of 55% to 60%. Wall  motion was normal; there were no regional wall motion abnormalities. Features are consistent with a pseudonormal left ventricular filling pattern, with concomitant abnormal relaxation and increased filling pressure (grade 2 diastolic dysfunction). - Ventricular septum: There appeared to be respirophasic variation of the interventricular septum. - Aortic valve: There was no stenosis. - Left atrium: The atrium was mildly dilated. - Right ventricle: The  cavity size was mildly dilated. Systolic function was normal. - Right atrium: The atrium was mildly dilated. - Tricuspid valve: Peak RV-RA gradient: 27mm Hg (S). - Pulmonary arteries: PA peak pressure: 30mm Hg (S). - Inferior vena cava: The vessel was normal in size; the respirophasic diameter changes were in the normal range (= 50%); findings are consistent with normal central venous pressure. Impressions:  - Normal LV size with EF 55-60%. Moderate diastolic dysfunction. Mildly dilated RV with normal systolic function. There appeared to be respirophasic variation of the interventricular septum.     Signed:  Marcellus Scott, MD, FACP, FHM. [facility] Pager [phone]  If 7PM-7AM, please contact night-coverage [email or web address] Password [email or web address] 12/07/2013, 2:01 PM

## 2013-12-07 NOTE — Progress Notes (Signed)
Pt walked on room air 350 ft. O2 sat pre- 96%. Lowest O2 sat during walk 92%.

## 2013-12-07 NOTE — Care Management Note (Signed)
    Page 1 of 1   12/07/2013     5:08:39 PM   CARE MANAGEMENT NOTE 12/07/2013  Patient:  Glori BickersLVIS,Simrah B   Account Number:  [id number]  Date Initiated:  12/07/2013  Documentation initiated by:  Juanito Gonyer  Subjective/Objective Assessment:   PT ADM ON 12/05/13 WITH [facility] DUE TO DRUG OD.  PT INDEPENDENT OF ADLS PTA.     Action/Plan:   PT HAS NO INSURANCE AND NO PCP.  SHE IS ELIGIBLE FOR MATCH LETTER.  LETTER GIVEN WITH EXPLANATION OF PROGRAM BENEFITS. CSW CONSULTED FOR POLYSUBSTANCE ABUSE.   Anticipated DC Date:  12/07/2013   Anticipated DC Plan:  HOME/SELF CARE      DC Planning Services  CM consult  Medication Assistance  MATCH Program  [facility]      Choice offered to / List presented to:             Status of service:  Completed, signed off Medicare Important Message given?   (If response is "NO", the following Medicare IM given date fields will be blank) Date Medicare IM given:   Date Additional Medicare IM given:    Discharge Disposition:  HOME/SELF CARE  Per UR Regulation:  Reviewed for med. necessity/level of care/duration of stay  If discussed at Long Length of Stay Meetings, dates discussed:    Comments:  12/07/13 Raman Featherston,RN,BSN [phone] LEFT MESSAGE FOR [facility] TO SET UP APPT FOR APPROX 1 WEEK.  WILL CALL PT WITH APPT WHEN AVAILABLE.

## 2013-12-12 LAB — CULTURE, BLOOD (ROUTINE X 2)
CULTURE: NO GROWTH
Culture: NO GROWTH

## 2014-04-11 ENCOUNTER — Other Ambulatory Visit: Payer: Self-pay | Admitting: Dermatology

## 2014-05-01 ENCOUNTER — Encounter (HOSPITAL_COMMUNITY): Payer: Self-pay | Admitting: Emergency Medicine

## 2014-05-01 ENCOUNTER — Emergency Department (HOSPITAL_COMMUNITY)
Admission: EM | Admit: 2014-05-01 | Discharge: 2014-05-01 | Discharge status: Against Medical Advice | Payer: Self-pay | Attending: Emergency Medicine | Admitting: Emergency Medicine

## 2014-05-01 DIAGNOSIS — R1032 Left lower quadrant pain: Secondary | ICD-10-CM | POA: Insufficient documentation

## 2014-05-01 NOTE — ED Notes (Signed)
No answer x 4  

## 2014-05-01 NOTE — ED Notes (Signed)
Patient complains of abdominal pain that started x 2 days ago.   Patient crying a lot in triage.   Patient states she was crying because she hurts.  Patient says she has been constantly nauseated, but no vomiting, "only dry heaves".   Patient advised the pain is LLQ and pain is sharp in nature intermittently.

## 2014-05-01 NOTE — ED Notes (Signed)
Pt did not answer when pager called and name called x2

## 2014-05-01 NOTE — ED Notes (Signed)
Attempted to draw pts labs was unsuccessful. Called phlebotomy  and spoke with Saa.  

## 2014-06-05 ENCOUNTER — Emergency Department (HOSPITAL_COMMUNITY): Payer: Self-pay

## 2014-06-05 ENCOUNTER — Emergency Department (HOSPITAL_COMMUNITY)
Admission: EM | Admit: 2014-06-05 | Discharge: 2014-06-06 | Disposition: A | Discharge status: Home / Self Care | Payer: Self-pay | Attending: Emergency Medicine | Admitting: Emergency Medicine

## 2014-06-05 DIAGNOSIS — Y9289 Other specified places as the place of occurrence of the external cause: Secondary | ICD-10-CM | POA: Insufficient documentation

## 2014-06-05 DIAGNOSIS — Y939 Activity, unspecified: Secondary | ICD-10-CM | POA: Insufficient documentation

## 2014-06-05 DIAGNOSIS — E669 Obesity, unspecified: Secondary | ICD-10-CM | POA: Insufficient documentation

## 2014-06-05 DIAGNOSIS — F17200 Nicotine dependence, unspecified, uncomplicated: Secondary | ICD-10-CM | POA: Insufficient documentation

## 2014-06-05 DIAGNOSIS — R05 Cough: Secondary | ICD-10-CM | POA: Insufficient documentation

## 2014-06-05 DIAGNOSIS — T50901A Poisoning by unspecified drugs, medicaments and biological substances, accidental (unintentional), initial encounter: Secondary | ICD-10-CM

## 2014-06-05 DIAGNOSIS — Z7982 Long term (current) use of aspirin: Secondary | ICD-10-CM | POA: Insufficient documentation

## 2014-06-05 DIAGNOSIS — Z79899 Other long term (current) drug therapy: Secondary | ICD-10-CM | POA: Insufficient documentation

## 2014-06-05 DIAGNOSIS — T401X4A Poisoning by heroin, undetermined, initial encounter: Secondary | ICD-10-CM | POA: Insufficient documentation

## 2014-06-05 DIAGNOSIS — R059 Cough, unspecified: Secondary | ICD-10-CM | POA: Insufficient documentation

## 2014-06-05 DIAGNOSIS — Z794 Long term (current) use of insulin: Secondary | ICD-10-CM | POA: Insufficient documentation

## 2014-06-05 DIAGNOSIS — E119 Type 2 diabetes mellitus without complications: Secondary | ICD-10-CM | POA: Insufficient documentation

## 2014-06-05 DIAGNOSIS — R0989 Other specified symptoms and signs involving the circulatory and respiratory systems: Secondary | ICD-10-CM | POA: Insufficient documentation

## 2014-06-05 DIAGNOSIS — I10 Essential (primary) hypertension: Secondary | ICD-10-CM | POA: Insufficient documentation

## 2014-06-05 DIAGNOSIS — N390 Urinary tract infection, site not specified: Secondary | ICD-10-CM | POA: Insufficient documentation

## 2014-06-05 LAB — CBG MONITORING, ED: GLUCOSE-CAPILLARY: 321 mg/dL — AB (ref 70–99)

## 2014-06-05 LAB — URINALYSIS, ROUTINE W REFLEX MICROSCOPIC
BILIRUBIN URINE: NEGATIVE
Glucose, UA: >1000 mg/dL — AB
Ketones, ur: NEGATIVE mg/dL
NITRITE: NEGATIVE
Protein, ur: NEGATIVE mg/dL
Specific Gravity, Urine: 1.039 — ABNORMAL HIGH (ref 1.005–1.030)
UROBILINOGEN UA: 0.2 mg/dL (ref 0.0–1.0)
pH: 6 (ref 5.0–8.0)

## 2014-06-05 LAB — RAPID URINE DRUG SCREEN, HOSP PERFORMED
Amphetamines: NOT DETECTED
BARBITURATES: NOT DETECTED
BENZODIAZEPINES: NOT DETECTED
Cocaine: POSITIVE — AB
Opiates: POSITIVE — AB
Tetrahydrocannabinol: NOT DETECTED

## 2014-06-05 LAB — URINE MICROSCOPIC-ADD ON

## 2014-06-05 MED ORDER — CIPROFLOXACIN HCL 500 MG PO TABS: 500.0000 mg | ORAL_TABLET | Freq: Two times a day (BID) | ORAL | Status: DC

## 2014-06-05 NOTE — ED Provider Notes (Signed)
CSN: [id number]     Arrival date & time 06/05/14  1729 History   First MD Initiated Contact with Patient 06/05/14 1946     Chief Complaint  Patient presents with  . Drug Overdose     (Consider location/radiation/quality/duration/timing/severity/associated sxs/prior Treatment) HPI Comments: Patient presents with drug overdose. She states that she was snorting heroin and woke up with the EMS personnel around her. She was given 2 mg of Narcan by EMS in route to the hospital. She denies any other drug use. She denies any physical complaints. She denies any chest pain shortness of breath or vomiting. She does say she's had a little bit of cough for the last few days. It's essentially nonproductive. She denies any fevers or chills.  Patient is a 48 y.o. female presenting with Overdose.  Drug Overdose Pertinent negatives include no chest pain, no abdominal pain, no headaches and no shortness of breath.    Past Medical History  Diagnosis Date  . Diabetes mellitus without complication   . Hypertension    Past Surgical History  Procedure Laterality Date  . Abdominal hysterectomy    . Cholecystectomy    . Rotator cuff repair     Family History  Problem Relation Age of Onset  . Diabetes Mellitus II Father   . CAD Neg Hx    History  Substance Use Topics  . Smoking status: Current Every Day Smoker -- 0.50 packs/day    Types: Cigarettes  . Smokeless tobacco: Not on file  . Alcohol Use: No   OB History   Grav Para Term Preterm Abortions TAB SAB Ect Mult Living                 Review of Systems  Constitutional: Negative for fever, chills, diaphoresis and fatigue.  HENT: Negative for congestion, rhinorrhea and sneezing.   Eyes: Negative.   Respiratory: Positive for cough. Negative for chest tightness and shortness of breath.   Cardiovascular: Negative for chest pain and leg swelling.  Gastrointestinal: Negative for nausea, vomiting, abdominal pain, diarrhea and blood in stool.   Genitourinary: Negative for frequency, hematuria, flank pain and difficulty urinating.  Musculoskeletal: Negative for arthralgias and back pain.  Skin: Negative for rash.  Neurological: Negative for dizziness, speech difficulty, weakness, numbness and headaches.      Allergies  Review of patient's allergies indicates no known allergies.  Home Medications   Prior to Admission medications   Medication Sig Start Date End Date Taking? Authorizing Provider  aspirin EC 81 MG tablet Take 81 mg by mouth daily.   Yes Historical Provider, MD  insulin aspart (NOVOLOG) 100 UNIT/ML injection Inject 0-9 Units into the skin 3 (three) times daily with meals. CBG < 70: eat or drink something sweet and recheck, CBG 70 - 120: 0 units CBG 121 - 150: 1 unit CBG 151 - 200: 2 units CBG 201 - 250: 3 units CBG 251 - 300: 5 units CBG 301 - 350: 7 units CBG 351 - 400: 9 units CBG > 400: call MD 12/07/13  Yes Elease Etienne, MD  insulin detemir (LEVEMIR) 100 UNIT/ML injection Inject 50 Units into the skin at bedtime.   Yes Historical Provider, MD  Multiple Vitamin (MULTI VITAMIN DAILY PO) Take 1 tablet by mouth daily.   Yes Historical Provider, MD  PARoxetine (PAXIL) 20 MG tablet Take 20 mg by mouth daily.   Yes Historical Provider, MD  ciprofloxacin (CIPRO) 500 MG tablet Take 1 tablet (500 mg total) by mouth 2 (  two) times daily. One po bid x 7 days 06/05/14   Rolan Bucco, MD  NALOXONE HCL IJ Inject 2 mg as directed once.    Historical Provider, MD   BP 109/75  Pulse 103  Temp(Src) 97.7 F (36.5 C) (Oral)  Resp 14  SpO2 95% Physical Exam  Constitutional: She is oriented to person, place, and time. She appears well-developed and well-nourished.  Obese  HENT:  Head: Normocephalic and atraumatic.  Eyes: Pupils are equal, round, and reactive to light.  Neck: Normal range of motion. Neck supple.  Cardiovascular: Normal rate, regular rhythm and normal heart sounds.   Pulmonary/Chest: Effort normal and  breath sounds normal. No respiratory distress. She has no wheezes. She has no rales. She exhibits no tenderness.  Rhonchi bilaterally  Abdominal: Soft. Bowel sounds are normal. There is no tenderness. There is no rebound and no guarding.  Musculoskeletal: Normal range of motion. She exhibits no edema.  Lymphadenopathy:    She has no cervical adenopathy.  Neurological: She is alert and oriented to person, place, and time.  Skin: Skin is warm and dry. No rash noted.  Psychiatric: She has a normal mood and affect.    ED Course  Procedures (including critical care time) Labs Review Results for orders placed during the hospital encounter of 06/05/14  URINALYSIS, ROUTINE W REFLEX MICROSCOPIC      Result Value Ref Range   Color, Urine YELLOW  YELLOW   APPearance CLOUDY (*) CLEAR   Specific Gravity, Urine 1.039 (*) 1.005 - 1.030   pH 6.0  5.0 - 8.0   Glucose, UA >1000 (*) NEGATIVE mg/dL   Hgb urine dipstick TRACE (*) NEGATIVE   Bilirubin Urine NEGATIVE  NEGATIVE   Ketones, ur NEGATIVE  NEGATIVE mg/dL   Protein, ur NEGATIVE  NEGATIVE mg/dL   Urobilinogen, UA 0.2  0.0 - 1.0 mg/dL   Nitrite NEGATIVE  NEGATIVE   Leukocytes, UA MODERATE (*) NEGATIVE  URINE RAPID DRUG SCREEN (HOSP PERFORMED)      Result Value Ref Range   Opiates POSITIVE (*) NONE DETECTED   Cocaine POSITIVE (*) NONE DETECTED   Benzodiazepines NONE DETECTED  NONE DETECTED   Amphetamines NONE DETECTED  NONE DETECTED   Tetrahydrocannabinol NONE DETECTED  NONE DETECTED   Barbiturates NONE DETECTED  NONE DETECTED  URINE MICROSCOPIC-ADD ON      Result Value Ref Range   Squamous Epithelial / LPF FEW (*) RARE   WBC, UA TOO NUMEROUS TO COUNT  <3 WBC/hpf   Bacteria, UA MANY (*) RARE  CBG MONITORING, ED      Result Value Ref Range   Glucose-Capillary 321 (*) 70 - 99 mg/dL   Dg Chest 2 View  10/09/1094   CLINICAL DATA:  Accidental heroin overdose  EXAM: CHEST  2 VIEW  COMPARISON:  12/06/2013  FINDINGS: The heart size and  mediastinal contours are within normal limits. Both lungs are clear. The visualized skeletal structures are unremarkable.  IMPRESSION: No active cardiopulmonary disease.   Electronically Signed   By: Alcide Clever M.D.   On: 06/05/2014 20:36      Imaging Review Dg Chest 2 View  06/05/2014   CLINICAL DATA:  Accidental heroin overdose  EXAM: CHEST  2 VIEW  COMPARISON:  12/06/2013  FINDINGS: The heart size and mediastinal contours are within normal limits. Both lungs are clear. The visualized skeletal structures are unremarkable.  IMPRESSION: No active cardiopulmonary disease.   Electronically Signed   By: Eulah Pont.D.  On: 06/05/2014 20:36     EKG Interpretation None      MDM   Final diagnoses:  Drug overdose, accidental or unintentional, initial encounter  UTI (lower urinary tract infection)    Patient is alert and oriented on arrival. She was monitored here in the ED for a few hours and had no change in mental status. I did do a chest x-ray to rule out aspiration and this was negative for infiltrate. She does have evidence of urinary tract infection I will start her on Cipro for this. She was encouraged to get help with her substance abuse.    Rolan Bucco, MD 06/05/14 [phone]

## 2014-06-05 NOTE — Discharge Instructions (Signed)
Accidental Overdose °A drug overdose occurs when a chemical substance (drug or medication) is used in amounts large enough to overcome a person. This may result in severe illness or death. This is a type of poisoning. Accidental overdoses of medications or other substances come from a variety of reasons. When this happens accidentally, it is often because the person taking the substance does not know enough about what they have taken. Drugs which commonly cause overdose deaths are alcohol, psychotropic medications (medications which affect the mind), pain medications, illegal drugs (street drugs) such as cocaine and heroin, and multiple drugs taken at the same time. It may result from careless behavior (such as over-indulging at a party). Other causes of overdose may include multiple drug use, a lapse in memory, or drug use after a period of no drug use.  °Sometimes overdosing occurs because a person cannot remember if they have taken their medication.  °A common unintentional overdose in young children involves multi-vitamins containing iron. Iron is a part of the hemoglobin molecule in blood. It is used to transport oxygen to living cells. When taken in small amounts, iron allows the body to restock hemoglobin. In large amounts, it causes problems in the body. If this overdose is not treated, it can lead to death. °Never take medicines that show signs of tampering or do not seem quite right. Never take medicines in the dark or in poor lighting. Read the label and check each dose of medicine before you take it. When adults are poisoned, it happens most often through carelessness or lack of information. Taking medicines in the dark or taking medicine prescribed for someone else to treat the same type of problem is a dangerous practice. °SYMPTOMS  °Symptoms of overdose depend on the medication and amount taken. They can vary from over-activity with stimulant over-dosage, to sleepiness from depressants such as  alcohol, narcotics and tranquilizers. Confusion, dizziness, nausea and vomiting may be present. If problems are severe enough coma and death may result. °DIAGNOSIS  °Diagnosis and management are generally straightforward if the drug is known. Otherwise it is more difficult. At times, certain symptoms and signs exhibited by the patient, or blood tests, can reveal the drug in question.  °TREATMENT  °In an emergency department, most patients can be treated with supportive measures. Antidotes may be available if there has been an overdose of opioids or benzodiazepines. A rapid improvement will often occur if this is the cause of overdose. °At home or away from medical care: °· There may be no immediate problems or warning signs in children. °· Not everything works well in all cases of poisoning. °· Take immediate action. Poisons may act quickly. °· If you think someone has swallowed medicine or a household product, and the person is unconscious, having seizures (convulsions), or is not breathing, immediately call for an ambulance. °IF a person is conscious and appears to be doing OK but has swallowed a poison: °· Do not wait to see what effect the poison will have. Immediately call a poison control center (listed in the white pages of your telephone book under "Poison Control" or inside the front cover with other emergency numbers). Some poison control centers have TTY capability for the deaf. Check with your local center if you or someone in your family requires this service. °· Keep the container so you can read the label on the product for ingredients. °· Describe what, when, and how much was taken and the age and condition of the person poisoned.   Inform them if the person is vomiting, choking, drowsy, shows a change in color or temperature of skin, is conscious or unconscious, or is convulsing.  Do not cause vomiting unless instructed by medical personnel. Do not induce vomiting or force liquids into a person who  is convulsing, unconscious, or very drowsy. Stay calm and in control.   Activated charcoal also is sometimes used in certain types of poisoning and you may wish to add a supply to your emergency medicines. It is available without a prescription. Call a poison control center before using this medication. PREVENTION  Thousands of children die every year from unintentional poisoning. This may be from household chemicals, poisoning from carbon monoxide in a car, taking their parent's medications, or simply taking a few iron pills or vitamins with iron. Poisoning comes from unexpected sources.  Store medicines out of the sight and reach of children, preferably in a locked cabinet. Do not keep medications in a food cabinet. Always store your medicines in a secure place. Get rid of expired medications.  If you have children living with you or have them as occasional guests, you should have child-resistant caps on your medicine containers. Keep everything out of reach. Child proof your home.  If you are called to the telephone or to answer the door while you are taking a medicine, take the container with you or put the medicine out of the reach of small children.  Do not take your medication in front of children. Do not tell your child how good a medication is and how good it is for them. They may get the idea it is more of a treat.  If you are an adult and have accidentally taken an overdose, you need to consider how this happened and what can be done to prevent it from happening again. If this was from a street drug or alcohol, determine if there is a problem that needs addressing. If you are not sure a problems exists, it is easy to talk to a professional and ask them if they think you have a problem. It is better to handle this problem in this way before it happens again and has a much worse consequence. Document Released: 12/04/2004 Document Revised: 12/13/2011 Document Reviewed: 05/12/2009 [facility]  Patient Information 2015 [place], [place]. This information is not intended to replace advice given to you by your health care provider. Make sure you discuss any questions you have with your health care provider.  Urinary Tract Infection Urinary tract infections (UTIs) can develop anywhere along your urinary tract. Your urinary tract is your body's drainage system for removing wastes and extra water. Your urinary tract includes two kidneys, two ureters, a bladder, and a urethra. Your kidneys are a pair of bean-shaped organs. Each kidney is about the size of your fist. They are located below your ribs, one on each side of your spine. CAUSES Infections are caused by microbes, which are microscopic organisms, including fungi, viruses, and bacteria. These organisms are so small that they can only be seen through a microscope. Bacteria are the microbes that most commonly cause UTIs. SYMPTOMS  Symptoms of UTIs may vary by age and gender of the patient and by the location of the infection. Symptoms in young women typically include a frequent and intense urge to urinate and a painful, burning feeling in the bladder or urethra during urination. Older women and men are more likely to be tired, shaky, and weak and have muscle aches and abdominal pain. A  fever may mean the infection is in your kidneys. Other symptoms of a kidney infection include pain in your back or sides below the ribs, nausea, and vomiting. DIAGNOSIS To diagnose a UTI, your caregiver will ask you about your symptoms. Your caregiver also will ask to provide a urine sample. The urine sample will be tested for bacteria and white blood cells. White blood cells are made by your body to help fight infection. TREATMENT  Typically, UTIs can be treated with medication. Because most UTIs are caused by a bacterial infection, they usually can be treated with the use of antibiotics. The choice of antibiotic and length of treatment depend on your symptoms and  the type of bacteria causing your infection. HOME CARE INSTRUCTIONS  If you were prescribed antibiotics, take them exactly as your caregiver instructs you. Finish the medication even if you feel better after you have only taken some of the medication.  Drink enough water and fluids to keep your urine clear or pale yellow.  Avoid caffeine, tea, and carbonated beverages. They tend to irritate your bladder.  Empty your bladder often. Avoid holding urine for long periods of time.  Empty your bladder before and after sexual intercourse.  After a bowel movement, women should cleanse from front to back. Use each tissue only once. SEEK MEDICAL CARE IF:   You have back pain.  You develop a fever.  Your symptoms do not begin to resolve within 3 days. SEEK IMMEDIATE MEDICAL CARE IF:   You have severe back pain or lower abdominal pain.  You develop chills.  You have nausea or vomiting.  You have continued burning or discomfort with urination. MAKE SURE YOU:   Understand these instructions.  Will watch your condition.  Will get help right away if you are not doing well or get worse. Document Released: 06/30/2005 Document Revised: 03/21/2012 Document Reviewed: 10/29/2011 [facility] Patient Information 2015 [place], [place]. This information is not intended to replace advice given to you by your health care provider. Make sure you discuss any questions you have with your health care provider.   Emergency Department Resource Guide 1) Find a Doctor and Pay Out of Pocket Although you won't have to find out who is covered by your insurance plan, it is a good idea to ask around and get recommendations. You will then need to call the office and see if the doctor you have chosen will accept you as a new patient and what types of options they offer for patients who are self-pay. Some doctors offer discounts or will set up payment plans for their patients who do not have insurance, but you will need to  ask so you aren't surprised when you get to your appointment.  2) Contact Your Local Health Department Not all health departments have doctors that can see patients for sick visits, but many do, so it is worth a call to see if yours does. If you don't know where your local health department is, you can check in your phone book. The CDC also has a tool to help you locate your state's health department, and many state websites also have listings of all of their local health departments.  3) Find a Walk-in Clinic If your illness is not likely to be very severe or complicated, you may want to try a walk in clinic. These are popping up all over the country in pharmacies, drugstores, and shopping centers. They're usually staffed by nurse practitioners or physician assistants that have been trained  to treat common illnesses and complaints. They're usually fairly quick and inexpensive. However, if you have serious medical issues or chronic medical problems, these are probably not your best option.  No Primary Care Doctor: - Call Health Connect at  [phone] - they can help you locate a primary care doctor that  accepts your insurance, provides certain services, etc. - Physician Referral Service- [phone]  Chronic Pain Problems: Organization         Address  Phone   Notes  [facility]  [phone] Patients need to be referred by their primary care doctor.   Medication Assistance: Organization         Address  Phone   Notes  [facility] Medication [facility] [address] [place] [place], [place] [address] [phone] --Must be a resident of [facility] -- Must have NO insurance coverage whatsoever (no Medicaid/ Medicare, etc.) -- The pt. MUST have a primary care doctor that directs their care regularly and follows them in the community   [facility]  [phone]   [employer]  [phone]    Agencies that provide inexpensive medical  care: Organization         Address  Phone   Notes  [facility]  [phone]   [facility]    [phone]   [facility] [address] [place], [place] [address] [phone]   [facility] [address], [place] [phone]   Planned Parenthood    [phone]   [facility]    [phone]   [facility] and [facility]  [address], [place] Phone:  [phone], Fax:  [phone] Hours of Operation:  9 am - 6 pm, M-F.  Also accepts Medicaid/Medicare and self-pay.  [facility]  [address], [place] Phone: [phone], Fax: [phone]. Hours of Operation:  8:30 am - 5:30 pm, M-F.  Also accepts Medicaid and self-pay.  [facility] [place] [address] [place] Phone: [phone]   [facility] [address] Natasha Bence [place], [place] [phone], Ext. [phone] Mondays & Thursdays: 7-9 AM.  First 15 patients are seen on a first come, first serve basis.    Medicaid-accepting [facility] Providers:  Organization         Address  Phone   Notes  [facility] [address], [place] [phone] Also accepts self-pay patients.  [facility] [address] Laurell Josephs [place], [place]  [phone]   [facility] [address], [place] [phone]   [facility] [address] [place] [phone]   Renaye Rakers [address], [place]   [phone] Only accepts [place] Access [place] patients after they have their name applied to their card.   Self-Pay (no insurance) in [facility]:  Organization         Address  Phone   Notes  Sickle Cell Patients, [facility] Internal Medicine [address] [place], [place] [phone]   [facility] [address] [place],  [place] [phone]   [facility]  [address], [place] [phone]   [facility]/Dr. Osei-Bonsu  [address] [place] or [address], Laure[address] Josephs [address], [place] [phone] Phone number for both [facility] and [place] locations is the same.  [facility] and [facility] [address], [place] [phone]   [facility] [address], [place] or [address] [phone]   [facility] [address], [place] [phone], phone; [phone], fax Sees patients 1st and 3rd Saturday of every month.  Must not qualify for public or private insurance (i.e. Medicaid, Medicare, [place] Health Choice, Veterans' Benefits)  Household income should be no more than 200% of the poverty level The clinic cannot treat you if you are pregnant or think you are pregnant  Sexually transmitted diseases are not treated at the clinic.   Dental Care: Organization         Address  Phone  Notes  [facility] Department of [facility] [address] [place], [place] [phone] Accepts children up to age 16 who are enrolled in [place] or [place] Health Choice; pregnant women with a Medicaid card; and children who have applied for Medicaid or [place] Health Choice, but were declined, whose parents can pay a reduced fee at time of service.  [facility] Department of [facility]  [address], [place] [phone] Accepts children up to age 17 who are enrolled in [place] or [place] Health Choice; pregnant women with a Medicaid card; and children who have applied for Medicaid or [place] Health Choice, but were declined, whose parents can pay a reduced fee at time of service.  [facility]  [address] [place], [place] [phone] Patients are seen by appointment only. Walk-ins are not accepted. [facility] will see patients 75 years of age and older. Monday - Tuesday (8am-5pm) Most Wednesdays (8:30-5pm) $30 per visit, cash only  [facility] Adult Dental Access [facility]  [address], [facility] [phone] Patients are seen by appointment only. Walk-ins are not accepted. [facility] will see patients 44 years of age and older. One Wednesday Evening (Monthly: Volunteer Based).  $30 per visit, cash only  [employer]  [phone] for adults; Children under age 18, call Graduate Pediatric Dentistry at [phone]. Children aged 15-14, please call [phone] to request a pediatric application.  Dental services are provided in all areas of dental care including fillings, crowns and bridges, complete and partial dentures, implants, gum treatment, root canals, and extractions. Preventive care is also provided. Treatment is provided to both adults and children. Patients are selected via a lottery and there is often a waiting list.   [facility] [address] [place]  [phone] [email or web address]   [facility] [address] [place], [place] [phone], Ext. [phone] Second and Fourth Thursday of each month, opens at 6:30 AM; Clinic ends at 9 AM.  Patients are seen on a first-come first-served basis, and a limited number are seen during each clinic.   [facility]  [address] Ether Griffins [place], [place] [phone]   Eligibility Requirements You must have lived in [place], [place], or [place] counties for at least the last three months.   You cannot be eligible for state or federal sponsored [place], including [employer], [place], or [employer].   You generally cannot be eligible for healthcare insurance through your employer.    How to apply: Eligibility screenings are held every Tuesday  and Wednesday afternoon from 1:00 pm until 4:00 pm. You do not need an appointment for the interview!   [facility] [address] [place], [place] [phone]   [facility]  [phone]   [facility]  [phone]   [facility] Health Department  [phone]    Behavioral Health Resources in the Community: Intensive Outpatient Programs Organization         Address  Phone  Notes  [facility] [address] [place], [place] [phone]   [facility] Outpatient [address] [place], [place] [phone]   [facility] [address] [place], [place]  [phone]   [facility] [address],  [place], [place] [phone] or [phone]   Substance Abuse Resources Organization         Address  Phone  Notes  [facility]  [phone]   [facility]  [phone]   [facility]  2205423898   Floydene Flock  [phone]   Residential & Outpatient Substance Abuse Program  [phone]   Psychological Services Organization         Address  Phone  Notes  [facility]  [phone]   [facility]  [phone]   [facility] [address], [place] [phone] or [phone]    Mobile Crisis Teams Organization         Address  Phone  Notes  [facility], Mobile Crisis Care Unit  [phone]   [facility]  [address] [place], [place] [phone]   Doristine Locks [address] [place] [phone]    Self-Help/Support Groups Organization         Address  Phone             Notes  [facility] [place] - variety of support groups  336- [phone] Call for more information  Narcotics Anonymous (NA), [facility] [address], [employer] [place]  2 meetings at this location   Statistician         Address  Phone  Notes  [facility] [address],    [place]  [phone]   [facility]  [address] [place] [address], [place], [place] [phone]   [facility] Treatment Facility [address] [place], [place] [phone] Admissions: 8am-3pm M-F  [facility] [address]    [place], [place] [phone]   [facility] [address] [place], [place], [place] [phone]   [facility] [address]  [place], [place] [phone]   [facility] - Intensive Outpatient [address] Laurell Josephs [address], [place], [place] [phone]   [facility] ([facility] [address] [place]  [place], [place] [phone] or [phone]   [facility] ([facility]) [address], [place], [place] [phone] Accepts Medicaid  [facility] [address]  [place] [phone] Substance Abuse/Addiction Treatment   [facility] Organization         Address  Phone  Notes  [facility]  [phone]   Angie Fava, PhD [address] Ervin Knack [place], [place]   [phone] or [phone]   [facility] Behavioral   [address] [place], [place] [phone]   [facility] [address] [place], [place] [phone] Insurance/Medicaid/sponsorship through [facility] [address]                                    [place], [place] [phone] Therapy/tele-psych/case  [facility] [address]   [place], [place] [phone]    Dr. Lolly Mustache  [phone]   [facility]  [employer] [facility] Dept. 1) [address],  2) [address] [place] 3)  [address], [place] [phone]  [phone]   [facility] Child Abuse Hotline 785-011-6568 or 6301169029 (After Hours)

## 2014-06-05 NOTE — ED Notes (Signed)
She was found obtunded by her son; who phoned EMS.  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 Son was taken away by G.P.D.  She arrives here awake and alert and ambulates to b.r. On arrival.  She is in no distress.  She was given a total of  Narcan IV by EMS en route to hospital.

## 2014-06-05 NOTE — ED Notes (Signed)
Bed: WA15 Expected date:  Expected time:  Means of arrival:  Comments: Heroin OD 

## 2014-07-27 ENCOUNTER — Emergency Department (HOSPITAL_COMMUNITY)
Admission: EM | Admit: 2014-07-27 | Discharge: 2014-07-28 | Disposition: A | Discharge status: Home / Self Care | Payer: Self-pay | Attending: Emergency Medicine | Admitting: Emergency Medicine

## 2014-07-27 ENCOUNTER — Encounter (HOSPITAL_COMMUNITY): Payer: Self-pay | Admitting: Emergency Medicine

## 2014-07-27 DIAGNOSIS — F32A Depression, unspecified: Secondary | ICD-10-CM

## 2014-07-27 DIAGNOSIS — R0789 Other chest pain: Secondary | ICD-10-CM | POA: Insufficient documentation

## 2014-07-27 DIAGNOSIS — Z7982 Long term (current) use of aspirin: Secondary | ICD-10-CM | POA: Insufficient documentation

## 2014-07-27 DIAGNOSIS — I10 Essential (primary) hypertension: Secondary | ICD-10-CM | POA: Insufficient documentation

## 2014-07-27 DIAGNOSIS — F410 Panic disorder [episodic paroxysmal anxiety] without agoraphobia: Secondary | ICD-10-CM | POA: Insufficient documentation

## 2014-07-27 DIAGNOSIS — R110 Nausea: Secondary | ICD-10-CM | POA: Insufficient documentation

## 2014-07-27 DIAGNOSIS — A599 Trichomoniasis, unspecified: Secondary | ICD-10-CM

## 2014-07-27 DIAGNOSIS — Z794 Long term (current) use of insulin: Secondary | ICD-10-CM | POA: Insufficient documentation

## 2014-07-27 DIAGNOSIS — R0602 Shortness of breath: Secondary | ICD-10-CM | POA: Insufficient documentation

## 2014-07-27 DIAGNOSIS — R51 Headache: Secondary | ICD-10-CM | POA: Insufficient documentation

## 2014-07-27 DIAGNOSIS — Z792 Long term (current) use of antibiotics: Secondary | ICD-10-CM | POA: Insufficient documentation

## 2014-07-27 DIAGNOSIS — F1910 Other psychoactive substance abuse, uncomplicated: Secondary | ICD-10-CM

## 2014-07-27 DIAGNOSIS — F419 Anxiety disorder, unspecified: Secondary | ICD-10-CM

## 2014-07-27 DIAGNOSIS — Z720 Tobacco use: Secondary | ICD-10-CM | POA: Insufficient documentation

## 2014-07-27 DIAGNOSIS — E119 Type 2 diabetes mellitus without complications: Secondary | ICD-10-CM | POA: Insufficient documentation

## 2014-07-27 DIAGNOSIS — M549 Dorsalgia, unspecified: Secondary | ICD-10-CM | POA: Insufficient documentation

## 2014-07-27 DIAGNOSIS — F329 Major depressive disorder, single episode, unspecified: Secondary | ICD-10-CM | POA: Insufficient documentation

## 2014-07-27 LAB — CBC
HCT: 46.5 % — ABNORMAL HIGH (ref 36.0–46.0)
Hemoglobin: 15.7 g/dL — ABNORMAL HIGH (ref 12.0–15.0)
MCH: 29.1 pg (ref 26.0–34.0)
MCHC: 33.8 g/dL (ref 30.0–36.0)
MCV: 86.1 fL (ref 78.0–100.0)
Platelets: 271 10*3/uL (ref 150–400)
RBC: 5.4 MIL/uL — ABNORMAL HIGH (ref 3.87–5.11)
RDW: 13.5 % (ref 11.5–15.5)
WBC: 14.1 10*3/uL — ABNORMAL HIGH (ref 4.0–10.5)

## 2014-07-27 LAB — COMPREHENSIVE METABOLIC PANEL
ALBUMIN: 3.4 g/dL — AB (ref 3.5–5.2)
ALK PHOS: 101 U/L (ref 39–117)
ALT: 35 U/L (ref 0–35)
ANION GAP: 15 (ref 5–15)
AST: 23 U/L (ref 0–37)
BUN: 12 mg/dL (ref 6–23)
CALCIUM: 9.6 mg/dL (ref 8.4–10.5)
CO2: 22 mEq/L (ref 19–32)
CREATININE: 0.61 mg/dL (ref 0.50–1.10)
Chloride: 99 mEq/L (ref 96–112)
GFR calc Af Amer: >90 mL/min (ref >90)
GFR calc non Af Amer: >90 mL/min (ref >90)
Glucose, Bld: 286 mg/dL — ABNORMAL HIGH (ref 70–99)
POTASSIUM: 4.2 meq/L (ref 3.7–5.3)
Sodium: 136 mEq/L — ABNORMAL LOW (ref 137–147)
TOTAL PROTEIN: 7.2 g/dL (ref 6.0–8.3)
Total Bilirubin: 0.2 mg/dL — ABNORMAL LOW (ref 0.3–1.2)

## 2014-07-27 LAB — URINALYSIS, ROUTINE W REFLEX MICROSCOPIC
Bilirubin Urine: NEGATIVE
Glucose, UA: >1000 mg/dL — AB
Ketones, ur: NEGATIVE mg/dL
Nitrite: NEGATIVE
PH: 6 (ref 5.0–8.0)
Protein, ur: 30 mg/dL — AB
SPECIFIC GRAVITY, URINE: 1.039 — AB (ref 1.005–1.030)
Urobilinogen, UA: 0.2 mg/dL (ref 0.0–1.0)

## 2014-07-27 LAB — RAPID URINE DRUG SCREEN, HOSP PERFORMED
Amphetamines: NOT DETECTED
Barbiturates: NOT DETECTED
Benzodiazepines: POSITIVE — AB
Cocaine: POSITIVE — AB
OPIATES: NOT DETECTED
Tetrahydrocannabinol: NOT DETECTED

## 2014-07-27 LAB — URINE MICROSCOPIC-ADD ON

## 2014-07-27 LAB — CBG MONITORING, ED
GLUCOSE-CAPILLARY: 233 mg/dL — AB (ref 70–99)
Glucose-Capillary: 268 mg/dL — ABNORMAL HIGH (ref 70–99)

## 2014-07-27 LAB — ETHANOL

## 2014-07-27 LAB — ACETAMINOPHEN LEVEL

## 2014-07-27 LAB — SALICYLATE LEVEL: Salicylate Lvl: <2 mg/dL — ABNORMAL LOW (ref 2.8–20.0)

## 2014-07-27 MED ORDER — ACETAMINOPHEN 325 MG PO TABS: 650.0000 mg | ORAL_TABLET | ORAL | Status: DC | PRN

## 2014-07-27 MED ORDER — ONDANSETRON HCL 4 MG PO TABS: 4.0000 mg | ORAL_TABLET | Freq: Three times a day (TID) | ORAL | Status: DC | PRN

## 2014-07-27 MED ORDER — INSULIN ASPART 100 UNIT/ML ~~LOC~~ SOLN
0.0000 [IU] | SUBCUTANEOUS | Status: DC
  Administered 2014-07-27: 8 [IU] via SUBCUTANEOUS
  Administered 2014-07-28 (×2): 12 [IU] via SUBCUTANEOUS
  Administered 2014-07-28: 8 [IU] via SUBCUTANEOUS
  Administered 2014-07-28: 2 [IU] via SUBCUTANEOUS
  Filled 2014-07-27 (×7): qty 1

## 2014-07-27 MED ORDER — IBUPROFEN 200 MG PO TABS
600.0000 mg | ORAL_TABLET | Freq: Three times a day (TID) | ORAL | Status: DC | PRN
  Administered 2014-07-28: 600 mg via ORAL
  Filled 2014-07-27 (×2): qty 3

## 2014-07-27 MED ORDER — ZOLPIDEM TARTRATE 5 MG PO TABS
5.0000 mg | ORAL_TABLET | Freq: Every evening | ORAL | Status: DC | PRN
  Administered 2014-07-27: 5 mg via ORAL
  Filled 2014-07-27: qty 1

## 2014-07-27 NOTE — ED Notes (Signed)
Patient seen shedding tears, wrapped self in a blanket. When the writer asked patient what the problem was, patient stated "I don't know. I am not suppose to be here but I know I needed help". offered Support and encouragement to the patient. Patient encouraged to verbalize needs to staff. Offered and accepted nourishment. Warmth provided upon patient's request. Will continue to monitor patient.
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
## 2014-07-27 NOTE — ED Provider Notes (Signed)
CSN: [id number]     Arrival date & time 07/27/14  1527 History  This chart was scribed for a non-physician practitioner, Madelyn Flavorsourtney Forucci, PA-C, working with Derwood KaplanAnkit Nanavati, MD by Julian HyMorgan Graham, ED Scribe. The patient was seen in [facility]. The patient's care was started at 4:34 PM.   Chief Complaint  Patient presents with  . Depression   The history is provided by the patient. No language interpreter was used.   HPI Comments: Claudia Erickson is a 48 y.o. [facility] who presents to the Emergency Department complaining of new, moderate, constant and gradually worsening depression onset one week ago. Pt states she has been crying uncontrollably. Pt has associated panic attacks, anxiety, back pain, foot pain bilaterally, chest tightness, SOB, nausea, headache and numbness in her feet. Pt has chronic back pain. Pt has numbness in her feet due to her diabetes. Pt was previously taking medication for her depression but has not recently due to lack of insurance. Pt denies checking her blood sugar or taking medication for her diabetes. Per EMS notes her blood sugar was 309 today. Pt states she has had panic attacks several times/day, everyday for the past week and experiences chest pain and SOB during those episodes. She states she had 1-2 panic attacks in her sleep which is new. Pt denies SI or HI today, but states she has had those thoughts previously. Pt denies vomiting.  Pt smokes 0.5 pack of cigarettes/day. Pt denies alcohol usage. Pt has used heroin and crack cocaine "in small amounts, infrequently". Pt last used these drugs three days ago.  Past Medical History  Diagnosis Date  . Diabetes mellitus without complication   . Hypertension    Past Surgical History  Procedure Laterality Date  . Abdominal hysterectomy    . Cholecystectomy    . Rotator cuff repair     Family History  Problem Relation Age of Onset  . Diabetes Mellitus II Father   . CAD Neg Hx    History   Substance Use Topics  . Smoking status: Current Every Day Smoker -- 0.50 packs/day    Types: Cigarettes  . Smokeless tobacco: Not on file  . Alcohol Use: No   OB History   Grav Para Term Preterm Abortions TAB SAB Ect Mult Living                 Review of Systems  Constitutional: Negative for fever and chills.  Respiratory: Positive for chest tightness and shortness of breath.   Gastrointestinal: Positive for nausea. Negative for vomiting.  Musculoskeletal: Positive for back pain.  Neurological: Positive for numbness and headaches. Negative for weakness.  Psychiatric/Behavioral: Positive for dysphoric mood. The patient is nervous/anxious.    Allergies  Review of patient's allergies indicates no known allergies.  Home Medications   Prior to Admission medications   Medication Sig Start Date End Date Taking? Authorizing Provider  aspirin EC 81 MG tablet Take 81 mg by mouth daily.    Historical Provider, MD  ciprofloxacin (CIPRO) 500 MG tablet Take 1 tablet (500 mg total) by mouth 2 (two) times daily. One po bid x 7 days 06/05/14   Rolan BuccoMelanie Belfi, MD  insulin aspart (NOVOLOG) 100 UNIT/ML injection Inject 0-9 Units into the skin 3 (three) times daily with meals. CBG < 70: eat or drink something sweet and recheck, CBG 70 - 120: 0 units; CBG 121 - 150: 1 unit; CBG 151 - 200: 2 units ; CBG 201 - 250: 3  units; CBG 251 - 300: 5 units ;CBG 301 - 350: 7 units ;CBG 351 - 400: 9 units; CBG > 400: call MD 12/07/13   Elease Etienne, MD  insulin detemir (LEVEMIR) 100 UNIT/ML injection Inject 50 Units into the skin at bedtime.    Historical Provider, MD  Multiple Vitamin (MULTI VITAMIN DAILY PO) Take 1 tablet by mouth daily.    Historical Provider, MD  NALOXONE HCL IJ Inject 2 mg as directed once.    Historical Provider, MD  PARoxetine (PAXIL) 20 MG tablet Take 20 mg by mouth daily.    Historical Provider, MD   Triage Vitals: BP 131/115  Pulse 106  Temp(Src) 98.6 F (37 C) (Oral)  Resp 22  SpO2  97%  Physical Exam  Nursing note and vitals reviewed. Constitutional: She is oriented to person, place, and time. She appears well-developed and well-nourished. No distress.  HENT:  Head: Normocephalic and atraumatic.  Eyes: Conjunctivae and EOM are normal. Pupils are equal, round, and reactive to light. No scleral icterus.  Neck: Neck supple. No JVD present. No tracheal deviation present. No thyromegaly present.  Cardiovascular: Normal rate, regular rhythm, normal heart sounds and intact distal pulses.  Exam reveals no gallop and no friction rub.   No murmur heard. Pulmonary/Chest: Effort normal and breath sounds normal. No respiratory distress. She has no wheezes. She has no rales. She exhibits no tenderness.  Abdominal: Soft. Bowel sounds are normal. She exhibits no distension and no mass. There is no tenderness. There is no rebound and no guarding.  Musculoskeletal: Normal range of motion.  Lymphadenopathy:    She has no cervical adenopathy.  Neurological: She is alert and oriented to person, place, and time. She has normal strength. No cranial nerve deficit or sensory deficit. Coordination normal.  Skin: Skin is warm and dry.  Psychiatric: Her affect is labile. Her speech is delayed. She is slowed and withdrawn. Thought content is not paranoid and not delusional. She exhibits a depressed mood. She expresses no homicidal and no suicidal ideation. She expresses no suicidal plans and no homicidal plans.   ED Course  Procedures (including critical care time) DIAGNOSTIC STUDIES: Oxygen Saturation is 97% on RA, normal by my interpretation.    COORDINATION OF CARE: 4:55 PM- Patient informed of current plan for treatment and evaluation and agrees with plan at this time.  Labs Review Labs Reviewed  CBC - Abnormal; Notable for the following:    WBC 14.1 (*)    RBC 5.40 (*)    Hemoglobin 15.7 (*)    HCT 46.5 (*)    All other components within normal limits  COMPREHENSIVE METABOLIC PANEL  - Abnormal; Notable for the following:    Sodium 136 (*)    Glucose, Bld 286 (*)    Albumin 3.4 (*)    Total Bilirubin 0.2 (*)    All other components within normal limits  SALICYLATE LEVEL - Abnormal; Notable for the following:    Salicylate Lvl <2.0 (*)    All other components within normal limits  URINE RAPID DRUG SCREEN (HOSP PERFORMED) - Abnormal; Notable for the following:    Cocaine POSITIVE (*)    Benzodiazepines POSITIVE (*)    All other components within normal limits  URINALYSIS, ROUTINE W REFLEX MICROSCOPIC - Abnormal; Notable for the following:    APPearance CLOUDY (*)    Specific Gravity, Urine 1.039 (*)    Glucose, UA >1000 (*)    Hgb urine dipstick SMALL (*)  Protein, ur 30 (*)    Leukocytes, UA MODERATE (*)    All other components within normal limits  URINE MICROSCOPIC-ADD ON - Abnormal; Notable for the following:    Bacteria, UA MANY (*)    All other components within normal limits  CBG MONITORING, ED - Abnormal; Notable for the following:    Glucose-Capillary 233 (*)    All other components within normal limits  ACETAMINOPHEN LEVEL  ETHANOL    Imaging Review No results found.   EKG Interpretation None      MDM   Final diagnoses:  Depression  Anxiety  Panic attacks  Polysubstance abuse   Patient is a 48 y.o. Female who presents to the ED with depression, panic attacks, and anxiety.  Physical exam reveals markedly labile and depressed female in NAD.  There is no suicidal or homicidal ideations present.  Given debilitating depression and panic attacks patient to be evaluated by TTS.  CBC unremarkable.  CMP reveals hyperglycemia.  UA shows moderate leukocytes with many bacteria.  Patient doesn't complain of any urinary symptoms at this time.  Patient has been moved to the psych ED pending TTS. TTS will reevaluate in the morning.  Patient is medically cleared at this time.    I personally performed the services described in this documentation, which  was scribed in my presence. The recorded information has been reviewed and is accurate.    Claudia Burowourtney A Forcucci, PA-C 07/27/14 2015

## 2014-07-27 NOTE — ED Notes (Signed)
She called EMS because "I've been having a panic attack for a week".  She admits to using crack and heroin yesterday, but not today.  Her CBG was 309 per EMS.  She is tearful and in no distress.
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
## 2014-07-28 ENCOUNTER — Encounter (HOSPITAL_COMMUNITY): Payer: Self-pay | Admitting: Registered Nurse

## 2014-07-28 DIAGNOSIS — F419 Anxiety disorder, unspecified: Secondary | ICD-10-CM

## 2014-07-28 DIAGNOSIS — F329 Major depressive disorder, single episode, unspecified: Secondary | ICD-10-CM

## 2014-07-28 DIAGNOSIS — F32A Depression, unspecified: Secondary | ICD-10-CM | POA: Diagnosis present

## 2014-07-28 DIAGNOSIS — F1994 Other psychoactive substance use, unspecified with psychoactive substance-induced mood disorder: Secondary | ICD-10-CM

## 2014-07-28 LAB — CBG MONITORING, ED
GLUCOSE-CAPILLARY: 126 mg/dL — AB (ref 70–99)
Glucose-Capillary: 209 mg/dL — ABNORMAL HIGH (ref 70–99)
Glucose-Capillary: 263 mg/dL — ABNORMAL HIGH (ref 70–99)

## 2014-07-28 MED ORDER — METRONIDAZOLE 500 MG PO TABS: 1000.0000 mg | ORAL_TABLET | Freq: Two times a day (BID) | ORAL | Status: DC

## 2014-07-28 MED ORDER — METRONIDAZOLE 500 MG PO TABS
1000.0000 mg | ORAL_TABLET | Freq: Two times a day (BID) | ORAL | Status: DC
  Administered 2014-07-28: 1000 mg via ORAL
  Filled 2014-07-28 (×2): qty 2

## 2014-07-28 NOTE — Discharge Instructions (Signed)
Depression °Depression refers to feeling sad, low, down in the dumps, blue, gloomy, or empty. In general, there are two kinds of depression: °1. Normal sadness or normal grief. This kind of depression is one that we all feel from time to time after upsetting life experiences, such as the loss of a job or the ending of a relationship. This kind of depression is considered normal, is short lived, and resolves within a few days to 2 weeks. Depression experienced after the loss of a loved one (bereavement) often lasts longer than 2 weeks but normally gets better with time. °2. Clinical depression. This kind of depression lasts longer than normal sadness or normal grief or interferes with your ability to function at home, at work, and in school. It also interferes with your personal relationships. It affects almost every aspect of your life. Clinical depression is an illness. °Symptoms of depression can also be caused by conditions other than those mentioned above, such as: °· Physical illness. Some physical illnesses, including underactive thyroid gland (hypothyroidism), severe anemia, specific types of cancer, diabetes, uncontrolled seizures, heart and lung problems, strokes, and chronic pain are commonly associated with symptoms of depression. °· Side effects of some prescription medicine. In some people, certain types of medicine can cause symptoms of depression. °· Substance abuse. Abuse of alcohol and illicit drugs can cause symptoms of depression. °SYMPTOMS °Symptoms of normal sadness and normal grief include the following: °· Feeling sad or crying for short periods of time. °· Not caring about anything (apathy). °· Difficulty sleeping or sleeping too much. °· No longer able to enjoy the things you used to enjoy. °· Desire to be by oneself all the time (social isolation). °· Lack of energy or motivation. °· Difficulty concentrating or remembering. °· Change in appetite or weight. °· Restlessness or  agitation. °Symptoms of clinical depression include the same symptoms of normal sadness or normal grief and also the following symptoms: °· Feeling sad or crying all the time. °· Feelings of guilt or worthlessness. °· Feelings of hopelessness or helplessness. °· Thoughts of suicide or the desire to harm yourself (suicidal ideation). °· Loss of touch with reality (psychotic symptoms). Seeing or hearing things that are not real (hallucinations) or having false beliefs about your life or the people around you (delusions and paranoia). °DIAGNOSIS  °The diagnosis of clinical depression is usually based on how bad the symptoms are and how long they have lasted. Your health care provider will also ask you questions about your medical history and substance use to find out if physical illness, use of prescription medicine, or substance abuse is causing your depression. Your health care provider may also order blood tests. °TREATMENT  °Often, normal sadness and normal grief do not require treatment. However, sometimes antidepressant medicine is given for bereavement to ease the depressive symptoms until they resolve. °The treatment for clinical depression depends on how bad the symptoms are but often includes antidepressant medicine, counseling with a mental health professional, or both. Your health care provider will help to determine what treatment is best for you. °Depression caused by physical illness usually goes away with appropriate medical treatment of the illness. If prescription medicine is causing depression, talk with your health care provider about stopping the medicine, decreasing the dose, or changing to another medicine. °Depression caused by the abuse of alcohol or illicit drugs goes away when you stop using these substances. Some adults need professional help in order to stop drinking or using drugs. °SEEK IMMEDIATE MEDICAL   CARE IF:  You have thoughts about hurting yourself or others.  You lose touch  with reality (have psychotic symptoms).  You are taking medicine for depression and have a serious side effect. FOR MORE INFORMATION  National Alliance on Mental Illness: www.nami.[employer]  National Institute of Mental Health: http://www.maynard.net/www.nimh.nih.gov Document Released: 09/17/2000 Document Revised: 02/04/2014 Document Reviewed: 12/20/2011 [facility] Patient Information 2015 [place]. This information is not intended to replace advice given to you by your health care provider. Make sure you discuss any questions you have with your health care provider.  Chemical Dependency Chemical dependency is an addiction to drugs or alcohol. It is characterized by the repeated behavior of seeking out and using drugs and alcohol despite harmful consequences to the health and safety of ones self and others.  RISK FACTORS There are certain situations or behaviors that increase a person's risk for chemical dependency. These include:  A family history of chemical dependency.  A history of mental health issues, including depression and anxiety.  A home environment where drugs and alcohol are easily available to you.  Drug or alcohol use at a young age. SYMPTOMS  The following symptoms can indicate chemical dependency:  Inability to limit the use of drugs or alcohol.  Nausea, sweating, shakiness, and anxiety that occurs when alcohol or drugs are not being used.  An increase in amount of drugs or alcohol that is necessary to get drunk or high. People who experience these symptoms can assess their use of drugs and alcohol by asking themselves the following questions:  Have you been told by friends or family that they are worried about your use of alcohol or drugs?  Do friends and family ever tell you about things you did while drinking alcohol or using drugs that you do not remember?  Do you lie about using alcohol or drugs or about the amounts you use?  Do you have difficulty completing daily tasks unless  you use alcohol or drugs?  Is the level of your work or school performance lower because of your drug or alcohol use?  Do you get sick from using drugs or alcohol but keep using anyway?  Do you feel uncomfortable in social situations unless you use alcohol or drugs?  Do you use drugs or alcohol to help forget problems? An answer of yes to any of these questions may indicate chemical dependency. Professional evaluation is suggested. Document Released: 09/14/2001 Document Revised: 12/13/2011 Document Reviewed: 11/26/2010 [facility] Patient Information 2015 [place], [place]. This information is not intended to replace advice given to you by your health care provider. Make sure you discuss any questions you have with your health care provider.  Drug Abuse and Addiction in Sports There are many types of drugs that one may become addicted to including illegal drugs (marijuana, cocaine, amphetamines, hallucinogens, and narcotics), prescription drugs (hydrocodone, codeine, and alprazolam), and other chemicals such as alcohol or nicotine. Two types of addiction exist: physical and emotional. Physical addiction usually occurs after prolonged use of a drug. However, some drugs may only take a couple uses before addiction can occur. Physical addiction is marked by withdrawal symptoms in which the person experiences negative symptoms such as sweat, anxiety, tremors, hallucinations, or cravings in the absence of using the drug. Emotional dependence is the psychological desire for the "high" that the drugs produce when taken. SYMPTOMS   Inattentiveness.  Negligence.  Forgetfulness.  Insomnia.  Mood swings. RISK INCREASES WITH:   Family history of addiction.  Personal history of addictive  personality. Studies have shown that risk takers, which many athletes are, have a higher risk of addiction. PREVENTION The only adequate prevention of drug abuse is abstinence from drugs. TREATMENT  The first step  in quitting substance abuse is recognizing the problem and realizing that one has the power to change. Quitting requires a plan and support from others. It is often necessary to seek medical assistance. Caregivers are available to offer counseling, and for certain cases, medicine to diminish the physical symptoms of withdrawal. Many organizations exist such as Alcoholics Anonymous, Narcotics Anonymous, or the [employer] Alcoholism that offer support for individuals who have chosen to quit their habits. Document Released: 09/20/2005 Document Revised: 02/04/2014 Document Reviewed: 01/02/2009 [facility]Exit[facility] Patient Information 2015 [place], [place]. This information is not intended to replace advice given to you by your health care provider. Make sure you discuss any questions you have with your health care provider.  Trichomoniasis Trichomoniasis is an infection caused by an organism called Trichomonas. The infection can affect both women and men. In women, the outer female genitalia and the vagina are affected. In men, the penis is mainly affected, but the prostate and other reproductive organs can also be involved. Trichomoniasis is a sexually transmitted infection (STI) and is most often passed to another person through sexual contact.  RISK FACTORS  Having unprotected sexual intercourse.  Having sexual intercourse with an infected partner. SIGNS AND SYMPTOMS  Symptoms of trichomoniasis in women include:  Abnormal gray-green frothy vaginal discharge.  Itching and irritation of the vagina.  Itching and irritation of the area outside the vagina. Symptoms of trichomoniasis in men include:   Penile discharge with or without pain.  Pain during urination. This results from inflammation of the urethra. DIAGNOSIS  Trichomoniasis may be found during a Pap test or physical exam. Your health care provider may use one of the following methods to help diagnose this infection:  Examining vaginal  discharge under a microscope. For men, urethral discharge would be examined.  Testing the pH of the vagina with a test tape.  Using a vaginal swab test that checks for the Trichomonas organism. A test is available that provides results within a few minutes.  Doing a culture test for the organism. This is not usually needed. TREATMENT   You may be given medicine to fight the infection. Women should inform their health care provider if they could be or are pregnant. Some medicines used to treat the infection should not be taken during pregnancy.  Your health care provider may recommend over-the-counter medicines or creams to decrease itching or irritation.  Your sexual partner will need to be treated if infected. HOME CARE INSTRUCTIONS   Take medicines only as directed by your health care provider.  Take over-the-counter medicine for itching or irritation as directed by your health care provider.  Do not have sexual intercourse while you have the infection.  Women should not douche or wear tampons while they have the infection.  Discuss your infection with your partner. Your partner may have gotten the infection from you, or you may have gotten it from your partner.  Have your sex partner get examined and treated if necessary.  Practice safe, informed, and protected sex.  See your health care provider for other STI testing. SEEK MEDICAL CARE IF:   You still have symptoms after you finish your medicine.  You develop abdominal pain.  You have pain when you urinate.  You have bleeding after sexual intercourse.  You develop a  rash.  Your medicine makes you sick or makes you throw up (vomit). MAKE SURE YOU:  Understand these instructions.  Will watch your condition.  Will get help right away if you are not doing well or get worse. Document Released: 03/16/2001 Document Revised: 02/04/2014 Document Reviewed: 07/02/2013 [facility] Patient Information 2015 [place], [place].  This information is not intended to replace advice given to you by your health care provider. Make sure you discuss any questions you have with your health care provider.
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
## 2014-07-28 NOTE — ED Provider Notes (Signed)
Medical screening examination/treatment/procedure(s) were performed by non-physician practitioner and as supervising physician I was immediately available for consultation/collaboration.   EKG Interpretation None       Milas Schappell, MD 07/28/14 0120 

## 2014-07-28 NOTE — [facility] Suicide Risk Assessment (Cosign Needed)
Suicide Risk Assessment  Discharge Assessment     Demographic Factors:  Caucasian  Total Time spent with patient: 30 minutes  Psychiatric Specialty Exam:     Blood pressure 159/99, pulse 88, temperature 98 F (36.7 C), temperature source Oral, resp. rate 18, SpO2 100.00%.There is no weight on file to calculate BMI.   General Appearance: Casual   Eye Contact:: Good   Speech: Clear and Coherent and Normal Rate   Volume: Normal   Mood: Anxious and Depressed   Affect: Congruent   Thought Process: Circumstantial and Goal Directed   Orientation: Full (Time, Place, and Person)   Thought Content: Rumination   Suicidal Thoughts: No   Homicidal Thoughts: No   Memory: Immediate; Good  Recent; Good  Remote; Good   Judgement: Intact   Insight: Fair and Present   Psychomotor Activity: Normal   Concentration: Fair   Recall: Good   Fund of Knowledge:Good   Language: Good   Akathisia: No   Handed: Right   AIMS (if indicated):   Assets: Communication Skills  Desire for Improvement  Housing  Social Support   Sleep:   Musculoskeletal:  Strength & Muscle Tone: within normal limits  Gait & Station: normal  Patient leans: N/A    Mental Status Per Nursing Assessment::   On Admission:     Current Mental Status by Physician: Patient denies suicidal/homicidal ideation, psychosis, and paranoia  Loss Factors: NA  Historical Factors: NA  Risk Reduction Factors:   Sense of responsibility to family, Living with another person, especially a relative and Positive social support  Continued Clinical Symptoms:  Anxiety and Depression  Cognitive Features That Contribute To Risk:  Closed-mindedness    Suicide Risk:  Minimal: No identifiable suicidal ideation.  Patients presenting with no risk factors but with morbid ruminations; may be classified as minimal risk based on the severity of the depressive symptoms  Discharge Diagnoses:  AXIS I: Anxiety Disorder NOS, Depressive  Disorder NOS, Substance Abuse and Substance Induced Mood Disorder  AXIS II: Deferred  AXIS III:  Past Medical History   Diagnosis  Date   .  Diabetes mellitus without complication    .  Hypertension     AXIS IV: other psychosocial or environmental problems and problems related to social environment  AXIS V: 61-70 mild symptoms     Plan Of Care/Follow-up recommendations:  Activity:  as tolerated Diet:  as tolerated  Is patient on multiple antipsychotic therapies at discharge:  No   Has Patient had three or more failed trials of antipsychotic monotherapy by history:  No  Recommended Plan for Multiple Antipsychotic Therapies: NA    Rankin, Shuvon, FNP-BC 07/28/2014, 1:56 PM

## 2014-07-28 NOTE — ED Notes (Signed)
Patient discharged into care of family. Discharge information and instructions given. Valuables returned. Patient denied homicidal and suicidal thoughts. Patient denied hallucinations.

## 2014-08-27 ENCOUNTER — Emergency Department (HOSPITAL_COMMUNITY)
Admission: EM | Admit: 2014-08-27 | Discharge: 2014-08-27 | Disposition: A | Discharge status: Home / Self Care | Payer: Self-pay | Attending: Emergency Medicine | Admitting: Emergency Medicine

## 2014-08-27 ENCOUNTER — Ambulatory Visit (HOSPITAL_COMMUNITY)
Admission: RE | Admit: 2014-08-27 | Discharge: 2014-08-27 | Disposition: A | Discharge status: Home / Self Care | Payer: Self-pay | Source: Home / Self Care | Attending: Psychiatry | Admitting: Psychiatry

## 2014-08-27 ENCOUNTER — Encounter (HOSPITAL_COMMUNITY): Payer: Self-pay | Admitting: Emergency Medicine

## 2014-08-27 ENCOUNTER — Observation Stay (HOSPITAL_COMMUNITY)
Admission: AD | Admit: 2014-08-27 | Discharge: 2014-08-28 | Disposition: A | Discharge status: Home / Self Care | Payer: Self-pay | Source: Intra-hospital | Attending: Physician Assistant | Admitting: Physician Assistant

## 2014-08-27 ENCOUNTER — Encounter (HOSPITAL_COMMUNITY): Payer: Self-pay | Admitting: *Deleted

## 2014-08-27 DIAGNOSIS — E119 Type 2 diabetes mellitus without complications: Secondary | ICD-10-CM | POA: Insufficient documentation

## 2014-08-27 DIAGNOSIS — G8929 Other chronic pain: Secondary | ICD-10-CM | POA: Insufficient documentation

## 2014-08-27 DIAGNOSIS — M549 Dorsalgia, unspecified: Secondary | ICD-10-CM | POA: Insufficient documentation

## 2014-08-27 DIAGNOSIS — Z7982 Long term (current) use of aspirin: Secondary | ICD-10-CM | POA: Insufficient documentation

## 2014-08-27 DIAGNOSIS — Z794 Long term (current) use of insulin: Secondary | ICD-10-CM | POA: Insufficient documentation

## 2014-08-27 DIAGNOSIS — I10 Essential (primary) hypertension: Secondary | ICD-10-CM | POA: Insufficient documentation

## 2014-08-27 DIAGNOSIS — Z9114 Patient's other noncompliance with medication regimen: Secondary | ICD-10-CM | POA: Insufficient documentation

## 2014-08-27 DIAGNOSIS — F329 Major depressive disorder, single episode, unspecified: Secondary | ICD-10-CM | POA: Insufficient documentation

## 2014-08-27 DIAGNOSIS — Z8669 Personal history of other diseases of the nervous system and sense organs: Secondary | ICD-10-CM | POA: Insufficient documentation

## 2014-08-27 DIAGNOSIS — F1410 Cocaine abuse, uncomplicated: Secondary | ICD-10-CM | POA: Insufficient documentation

## 2014-08-27 DIAGNOSIS — F332 Major depressive disorder, recurrent severe without psychotic features: Secondary | ICD-10-CM | POA: Insufficient documentation

## 2014-08-27 DIAGNOSIS — F17210 Nicotine dependence, cigarettes, uncomplicated: Secondary | ICD-10-CM | POA: Insufficient documentation

## 2014-08-27 DIAGNOSIS — G629 Polyneuropathy, unspecified: Secondary | ICD-10-CM | POA: Insufficient documentation

## 2014-08-27 DIAGNOSIS — F419 Anxiety disorder, unspecified: Secondary | ICD-10-CM | POA: Insufficient documentation

## 2014-08-27 DIAGNOSIS — F410 Panic disorder [episodic paroxysmal anxiety] without agoraphobia: Secondary | ICD-10-CM | POA: Insufficient documentation

## 2014-08-27 DIAGNOSIS — Z79899 Other long term (current) drug therapy: Secondary | ICD-10-CM | POA: Insufficient documentation

## 2014-08-27 DIAGNOSIS — F39 Unspecified mood [affective] disorder: Secondary | ICD-10-CM | POA: Insufficient documentation

## 2014-08-27 DIAGNOSIS — F1490 Cocaine use, unspecified, uncomplicated: Secondary | ICD-10-CM | POA: Insufficient documentation

## 2014-08-27 DIAGNOSIS — Z720 Tobacco use: Secondary | ICD-10-CM | POA: Insufficient documentation

## 2014-08-27 DIAGNOSIS — F32A Depression, unspecified: Secondary | ICD-10-CM

## 2014-08-27 HISTORY — DX: Depression, unspecified: F32.A

## 2014-08-27 HISTORY — DX: Dorsalgia, unspecified: M54.9

## 2014-08-27 HISTORY — DX: Other psychoactive substance abuse, uncomplicated: F19.10

## 2014-08-27 HISTORY — DX: Anxiety disorder, unspecified: F41.9

## 2014-08-27 HISTORY — DX: Other psychoactive substance use, unspecified with psychoactive substance-induced mood disorder: F19.94

## 2014-08-27 HISTORY — DX: Polyneuropathy, unspecified: G62.9

## 2014-08-27 HISTORY — DX: Panic disorder (episodic paroxysmal anxiety): F41.0

## 2014-08-27 HISTORY — DX: Other chronic pain: G89.29

## 2014-08-27 HISTORY — DX: Major depressive disorder, single episode, unspecified: F32.9

## 2014-08-27 LAB — CBC WITH DIFFERENTIAL/PLATELET
Basophils Absolute: 0.1 10*3/uL (ref 0.0–0.1)
Basophils Relative: 1 % (ref 0–1)
Eosinophils Absolute: 0.2 10*3/uL (ref 0.0–0.7)
Eosinophils Relative: 2 % (ref 0–5)
HCT: 45.2 % (ref 36.0–46.0)
Hemoglobin: 15.1 g/dL — ABNORMAL HIGH (ref 12.0–15.0)
Lymphocytes Relative: 26 % (ref 12–46)
Lymphs Abs: 3.4 10*3/uL (ref 0.7–4.0)
MCH: 29.2 pg (ref 26.0–34.0)
MCHC: 33.4 g/dL (ref 30.0–36.0)
MCV: 87.4 fL (ref 78.0–100.0)
Monocytes Absolute: 0.7 10*3/uL (ref 0.1–1.0)
Monocytes Relative: 5 % (ref 3–12)
Neutro Abs: 8.9 10*3/uL — ABNORMAL HIGH (ref 1.7–7.7)
Neutrophils Relative %: 66 % (ref 43–77)
Platelets: 277 10*3/uL (ref 150–400)
RBC: 5.17 MIL/uL — ABNORMAL HIGH (ref 3.87–5.11)
RDW: 13.4 % (ref 11.5–15.5)
WBC: 13.3 10*3/uL — ABNORMAL HIGH (ref 4.0–10.5)

## 2014-08-27 LAB — BASIC METABOLIC PANEL
ANION GAP: 15 (ref 5–15)
BUN: 8 mg/dL (ref 6–23)
CO2: 23 meq/L (ref 19–32)
Calcium: 9.7 mg/dL (ref 8.4–10.5)
Chloride: 94 mEq/L — ABNORMAL LOW (ref 96–112)
Creatinine, Ser: 0.59 mg/dL (ref 0.50–1.10)
GFR calc Af Amer: >90 mL/min (ref >90)
GFR calc non Af Amer: >90 mL/min (ref >90)
GLUCOSE: 324 mg/dL — AB (ref 70–99)
POTASSIUM: 4.3 meq/L (ref 3.7–5.3)
Sodium: 132 mEq/L — ABNORMAL LOW (ref 137–147)

## 2014-08-27 LAB — RAPID URINE DRUG SCREEN, HOSP PERFORMED
Amphetamines: NOT DETECTED
Barbiturates: NOT DETECTED
Benzodiazepines: NOT DETECTED
Cocaine: POSITIVE — AB
Opiates: NOT DETECTED
Tetrahydrocannabinol: NOT DETECTED

## 2014-08-27 LAB — GLUCOSE, CAPILLARY: Glucose-Capillary: 270 mg/dL — ABNORMAL HIGH (ref 70–99)

## 2014-08-27 LAB — CBG MONITORING, ED: Glucose-Capillary: 324 mg/dL — ABNORMAL HIGH (ref 70–99)

## 2014-08-27 LAB — ETHANOL

## 2014-08-27 MED ORDER — NICOTINE 21 MG/24HR TD PT24: 21.0000 mg | MEDICATED_PATCH | Freq: Every day | TRANSDERMAL | Status: DC | PRN

## 2014-08-27 MED ORDER — LORAZEPAM 1 MG PO TABS
1.0000 mg | ORAL_TABLET | Freq: Three times a day (TID) | ORAL | Status: DC | PRN
  Administered 2014-08-27: 1 mg via ORAL
  Filled 2014-08-27: qty 1

## 2014-08-27 MED ORDER — PAROXETINE HCL 20 MG PO TABS
40.0000 mg | ORAL_TABLET | Freq: Every morning | ORAL | Status: DC
  Administered 2014-08-27: 40 mg via ORAL
  Filled 2014-08-27: qty 2

## 2014-08-27 MED ORDER — TRAZODONE HCL 50 MG PO TABS
50.0000 mg | ORAL_TABLET | Freq: Every evening | ORAL | Status: DC | PRN
Start: 2014-08-27 — End: 2014-08-28
  Administered 2014-08-27 (×2): 50 mg via ORAL
  Filled 2014-08-27 (×6): qty 1

## 2014-08-27 MED ORDER — ACETAMINOPHEN 325 MG PO TABS: 650.0000 mg | ORAL_TABLET | Freq: Four times a day (QID) | ORAL | Status: DC | PRN

## 2014-08-27 MED ORDER — ASPIRIN EC 81 MG PO TBEC
81.0000 mg | DELAYED_RELEASE_TABLET | Freq: Every day | ORAL | Status: DC
  Administered 2014-08-28: 81 mg via ORAL
  Filled 2014-08-27 (×3): qty 1

## 2014-08-27 MED ORDER — INSULIN DETEMIR 100 UNIT/ML ~~LOC~~ SOLN
50.0000 [IU] | Freq: Every day | SUBCUTANEOUS | Status: DC
  Administered 2014-08-27: 50 [IU] via SUBCUTANEOUS

## 2014-08-27 MED ORDER — PAROXETINE HCL 20 MG PO TABS
20.0000 mg | ORAL_TABLET | Freq: Every morning | ORAL | Status: DC
  Filled 2014-08-27 (×2): qty 1

## 2014-08-27 MED ORDER — INSULIN ASPART 100 UNIT/ML ~~LOC~~ SOLN
0.0000 [IU] | Freq: Every day | SUBCUTANEOUS | Status: DC
  Administered 2014-08-27: 3 [IU] via SUBCUTANEOUS

## 2014-08-27 MED ORDER — MAGNESIUM HYDROXIDE 400 MG/5ML PO SUSP: 30.0000 mL | Freq: Every day | ORAL | Status: DC | PRN

## 2014-08-27 MED ORDER — ADULT MULTIVITAMIN W/MINERALS CH
1.0000 | ORAL_TABLET | Freq: Every day | ORAL | Status: DC
Start: 2014-08-27 — End: 2014-08-27
  Administered 2014-08-27: 1 via ORAL
  Filled 2014-08-27: qty 1

## 2014-08-27 MED ORDER — ALUM & MAG HYDROXIDE-SIMETH 200-200-20 MG/5ML PO SUSP: 30.0000 mL | ORAL | Status: DC | PRN

## 2014-08-27 MED ORDER — INSULIN DETEMIR 100 UNIT/ML ~~LOC~~ SOLN
50.0000 [IU] | Freq: Every day | SUBCUTANEOUS | Status: DC
  Filled 2014-08-27: qty 0.5

## 2014-08-27 MED ORDER — INSULIN ASPART 100 UNIT/ML ~~LOC~~ SOLN: 0.0000 [IU] | Freq: Three times a day (TID) | SUBCUTANEOUS | Status: DC

## 2014-08-27 MED ORDER — NAPROXEN 500 MG PO TABS
500.0000 mg | ORAL_TABLET | Freq: Two times a day (BID) | ORAL | Status: DC | PRN
  Administered 2014-08-27: 500 mg via ORAL
  Filled 2014-08-27: qty 1

## 2014-08-27 MED ORDER — HYDROXYZINE HCL 25 MG PO TABS
25.0000 mg | ORAL_TABLET | Freq: Four times a day (QID) | ORAL | Status: DC | PRN
  Administered 2014-08-27: 25 mg via ORAL
  Filled 2014-08-27: qty 1

## 2014-08-27 MED ORDER — ACETAMINOPHEN 325 MG PO TABS
650.0000 mg | ORAL_TABLET | ORAL | Status: DC | PRN
  Administered 2014-08-27: 650 mg via ORAL
  Filled 2014-08-27: qty 2

## 2014-08-27 MED ORDER — INSULIN ASPART 100 UNIT/ML ~~LOC~~ SOLN
0.0000 [IU] | Freq: Three times a day (TID) | SUBCUTANEOUS | Status: DC
Start: 2014-08-27 — End: 2014-08-27
  Administered 2014-08-27: 7 [IU] via SUBCUTANEOUS
  Filled 2014-08-27: qty 1

## 2014-08-27 MED ORDER — ONDANSETRON HCL 4 MG PO TABS: 4.0000 mg | ORAL_TABLET | Freq: Three times a day (TID) | ORAL | Status: DC | PRN

## 2014-08-27 MED ORDER — ASPIRIN EC 81 MG PO TBEC
81.0000 mg | DELAYED_RELEASE_TABLET | Freq: Every day | ORAL | Status: DC
  Administered 2014-08-27: 81 mg via ORAL
  Filled 2014-08-27: qty 1

## 2014-08-27 NOTE — ED Notes (Signed)
Pt transported to [facility] by [facility] transportation service for continuation of specialized care. She left in no acute distress. 

## 2014-08-27 NOTE — BH Assessment (Addendum)
Spoke with Jerilee HohStacy West, RN charge nurse at [facility] to inform her that [facility] and inpatient treatment has been recommended per Ellen Henrionrad Withrow,NP. Pt presents with C/O increased Depression and Anxiety and inability to function and care for self. Pt reports history of Cocaine use, reporting that her last use was a few months ago.   Spoke with April with [facility] who reports E[facility] within 15 minutes.   Glorious PeachNajah Demiah Gullickson, MS, LCASA Assessment Counselor
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
## 2014-08-27 NOTE — Progress Notes (Signed)
[facility] INPATIENT:  Family/Significant Other Suicide Prevention Education  Suicide Prevention Education:  Patient Refusal for Family/Significant Other Suicide Prevention Education: The patient Claudia Erickson has refused to provide written consent for family/significant other to be provided Family/Significant Other Suicide Prevention Education during admission and/or prior to discharge.    Celene KrasRobinson, Juwuan Sedita G 08/27/2014, 9:58 PM
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
## 2014-08-27 NOTE — ED Notes (Signed)
Pt reports feeling depressed. Denies SI or HI. Pt has been treated for depression in the past yet did not take any treatment for over a year. Pt in tears and appears anxious .

## 2014-08-27 NOTE — ED Notes (Signed)
Bed: [facility] Expected date:  Expected time:  Means of arrival:  Comments: [facility]
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
## 2014-08-27 NOTE — Plan of Care (Signed)
[facility] Observation Crisis Plan  Reason for Crisis Plan:  Crisis Stabilization   Plan of Care:  Referral for IOP  Family Support:    Claudia PartridgeGene Erickson [phone]  Current Living Environment:  Living Arrangements: Parent, Other relatives  Insurance:   Hospital Account    Name Acct ID Class Status Primary Coverage   Claudia Erickson, Claudia Erickson [id number] BEHAVIORAL HEALTH OBSERVATION Open None        Guarantor Account (for Hospital Account [id number])    Name Relation to Pt Service Area Active? Acct Type   Claudia Erickson, Claudia Erickson Self [facility] Yes [facility]   Address Phone       [address] [place], [place] [id number] [phone])          Coverage Information (for Hospital Account [id number])    Not on file      Legal Guardian:     Primary Care Provider:  No primary care provider on file.  Current Outpatient Providers:  none  Psychiatrist:   none  Counselor/Therapist:   none  Compliant with Medications: stopped medication over 1year ago due to financial issues  Additional Information:   Claudia Erickson, Claudia Erickson 11/24/20159:55 PM

## 2014-08-27 NOTE — ED Notes (Signed)
Pt endorses caregiver strain and recent financial stressors as her major contributing factors compelling her increased depression and anxiety. States she cant stop crying for the last 2 months.

## 2014-08-27 NOTE — Progress Notes (Signed)
[facility] for [facility] and monitoring. Will re-evaluate in the morning.  Charles Niese, Everardo AllJohn C, FNP-BC

## 2014-08-28 DIAGNOSIS — F1994 Other psychoactive substance use, unspecified with psychoactive substance-induced mood disorder: Secondary | ICD-10-CM

## 2014-08-28 DIAGNOSIS — F410 Panic disorder [episodic paroxysmal anxiety] without agoraphobia: Secondary | ICD-10-CM

## 2014-08-28 DIAGNOSIS — F332 Major depressive disorder, recurrent severe without psychotic features: Secondary | ICD-10-CM

## 2014-08-28 DIAGNOSIS — F411 Generalized anxiety disorder: Secondary | ICD-10-CM

## 2014-08-28 LAB — GLUCOSE, CAPILLARY
GLUCOSE-CAPILLARY: 272 mg/dL — AB (ref 70–99)
Glucose-Capillary: 173 mg/dL — ABNORMAL HIGH (ref 70–99)

## 2014-08-28 MED ORDER — HYDROXYZINE HCL 25 MG PO TABS
25.0000 mg | ORAL_TABLET | Freq: Three times a day (TID) | ORAL | Status: DC
  Administered 2014-08-28: 25 mg via ORAL
  Filled 2014-08-28 (×4): qty 1

## 2014-08-28 MED ORDER — HYDROXYZINE HCL 25 MG PO TABS: 25.0000 mg | ORAL_TABLET | Freq: Three times a day (TID) | ORAL | Status: DC

## 2014-08-28 MED ORDER — INSULIN DETEMIR 100 UNIT/ML ~~LOC~~ SOLN: 50.0000 [IU] | Freq: Every day | SUBCUTANEOUS | Status: DC

## 2014-08-28 MED ORDER — INSULIN ASPART 100 UNIT/ML ~~LOC~~ SOLN: 0.0000 [IU] | Freq: Three times a day (TID) | SUBCUTANEOUS | Status: DC

## 2014-08-28 MED ORDER — GABAPENTIN 100 MG PO CAPS
100.0000 mg | ORAL_CAPSULE | Freq: Three times a day (TID) | ORAL | Status: DC
  Administered 2014-08-28: 100 mg via ORAL
  Filled 2014-08-28 (×4): qty 1

## 2014-08-28 MED ORDER — ASPIRIN EC 81 MG PO TBEC: 81.0000 mg | DELAYED_RELEASE_TABLET | Freq: Every day | ORAL | Status: DC

## 2014-08-28 MED ORDER — GABAPENTIN 100 MG PO CAPS: 100.0000 mg | ORAL_CAPSULE | Freq: Three times a day (TID) | ORAL | Status: DC

## 2014-08-28 MED ORDER — HYDROXYZINE HCL 25 MG PO TABS: 25.0000 mg | ORAL_TABLET | Freq: Every evening | ORAL | Status: DC | PRN

## 2014-08-28 NOTE — Progress Notes (Signed)
[facility] ED CM received a call from D Best, SW to assist with sending a [facility] CC referral [place] [facility] [place] needing to sign up for orange card. Had one previously per pt. CM completed referral to P4 CC staff member stacy at [phone]

## 2014-08-28 NOTE — Progress Notes (Signed)
Patient ID: Claudia Erickson, female   DOB: 03/10/1966, 48 y.o.   MRN: [id number] Discharge Note-Seen by Renata Capriceonrad NP this am and he determined after consulting Dr. Lucianne MussKumar that client is able to be safely discharged home today. She is denying any suicidal ideation and is not homicidal. She has follow up plans [facility] Monday. She has been given Rx for Neurontin and Vistaril for anxiety and neuropathy pain. She has been verbal, appropriate and very verbal with staff. She states she is feeling better than yesterday. She has the application for an orange card, last step is to have her father notarize a form stating she has no income and then to present application to one of the clinics on her list, given to her by Tyler Aasoris SW. She has plans for the holiday. Mom will soon be home from her assisted living. Baxter HireKristen the social worker will be following up with her on Monday. Reviewed with her the discharge plans, and she verbalized her understanding. All of her property returned to her. She called her father for transport home.
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
## 2014-08-28 NOTE — Progress Notes (Signed)
Pt is currently in the OBS unit and will be discharged today with resources. Pt has been given resources to [facility], pt given written instructions to obtain care. Pt voiced understanding. Pt also given a taxi voucher for transportation home. Pt was given information on the [facility]range Card and there will be follow up after discharge. Ellen Henrionrad Withrow,NP aware.  Derrell Lollingoris Teletha Petrea, MSW  Social Worker [phone]

## 2014-12-26 ENCOUNTER — Encounter: Payer: Self-pay | Admitting: Family

## 2014-12-26 ENCOUNTER — Ambulatory Visit (INDEPENDENT_AMBULATORY_CARE_PROVIDER_SITE_OTHER): Payer: Self-pay | Admitting: Family

## 2014-12-26 ENCOUNTER — Telehealth: Payer: Self-pay | Admitting: Family

## 2014-12-26 VITALS — BP 130/70 | HR 130 | Temp 98.1°F | Resp 18 | Ht 69.0 in | Wt 306.8 lb

## 2014-12-26 DIAGNOSIS — E1149 Type 2 diabetes mellitus with other diabetic neurological complication: Secondary | ICD-10-CM

## 2014-12-26 DIAGNOSIS — F329 Major depressive disorder, single episode, unspecified: Secondary | ICD-10-CM

## 2014-12-26 DIAGNOSIS — G4700 Insomnia, unspecified: Secondary | ICD-10-CM

## 2014-12-26 DIAGNOSIS — F418 Other specified anxiety disorders: Secondary | ICD-10-CM

## 2014-12-26 DIAGNOSIS — F419 Anxiety disorder, unspecified: Secondary | ICD-10-CM

## 2014-12-26 MED ORDER — RAMELTEON 8 MG PO TABS: 8.0000 mg | ORAL_TABLET | Freq: Every day | ORAL | Status: DC

## 2014-12-26 MED ORDER — METFORMIN HCL 500 MG PO TABS: 500.0000 mg | ORAL_TABLET | Freq: Two times a day (BID) | ORAL | Status: DC

## 2014-12-26 MED ORDER — CITALOPRAM HYDROBROMIDE 20 MG PO TABS: 20.0000 mg | ORAL_TABLET | Freq: Every day | ORAL | Status: DC

## 2014-12-26 NOTE — Assessment & Plan Note (Signed)
Discussed proper sleep hygiene and chance of improving sleep without medicine. She indicates that she has tried all of it. Sleeplessness is most likely related to anxiety and uncontrolled diabetes. Start ramelteon as trial for sleep.

## 2014-12-26 NOTE — Telephone Encounter (Signed)
emmi mailed  °

## 2014-12-26 NOTE — Assessment & Plan Note (Signed)
Symptoms and exam consistent with anxiety and major depressive disorder. Start Celexa. Follow up in 2 weeks by phone to determine effectiveness. Patient indicates she is not suicidal. Instructed to seek emergency care thoughts of suicide develop or if she develops a plan for suicide.

## 2014-12-26 NOTE — Assessment & Plan Note (Signed)
Type 2 Diabetes remains uncontrolled with previous A1c of 11.3 and home blood sugar readings 300-400. Start metformin. Diabetic eye exam is up to date. Diabetic foot exam completed. Follow up in 3 months secondary to limited income.

## 2014-12-26 NOTE — Progress Notes (Signed)
Pre visit review using our clinic review tool, if applicable. No additional management support is needed unless otherwise documented below in the visit note. 

## 2014-12-26 NOTE — Progress Notes (Signed)
Subjective:    Patient ID: Claudia Erickson, female    DOB: 09/23/1966, 49 y.o.   MRN: [id number]  Chief Complaint  Patient presents with  . Establish Care    Anxiety and depression, pt started out crying saying that her anxiety and depression is bad and she has diabetes which she can not afford the supplies, she says it stays in the 400s    HPI:  Claudia Erickson is a 49 y.o. female who presents today to establish care and discuss her anxiety, depression and diabetes.  1) Diabetes - Previously diagnosed with diabetes. Not currently taking any medications. Father is an optimetrist and completed her eye exam recently and did not see anything of concern. Does not recall her last A1c. Indicates that her blood sugars have been in the 350-400 range.   Lab Results  Component Value Date   HGBA1C 11.3* 12/06/2013    2) Anxiety and Depression - Associated symptoms of anxiety and depression is "so bad." Indicates that she is petrified to leave the house and has mood swings. Indicates that she has been dealing with it for years. Was previously treated with multiple anti-depressive medications and benzodiazepines.   3) Insomnia - Associated symptom of insomnia has been going on for a significant period of time. Has tried over the counter melatonin and some other natural supplements that only work for a couple of days.  No Known Allergies  No current outpatient prescriptions on file prior to visit.   No current facility-administered medications on file prior to visit.    Past Medical History  Diagnosis Date  . Diabetes mellitus without complication   . Hypertension   . Chronic back pain   . Peripheral neuropathy   . Substance abuse   . Substance induced mood disorder   . Anxiety and depression   . Panic attack   . GERD (gastroesophageal reflux disease)   . Positive TB test   . UTI (lower urinary tract infection)     Past Surgical History  Procedure Laterality Date  . Abdominal  hysterectomy    . Cholecystectomy    . Rotator cuff repair    . Tonsillectomy      Family History  Problem Relation Age of Onset  . Diabetes Mellitus II Father   . Colon cancer Father   . Hyperlipidemia Father   . Diabetes Father   . CAD Neg Hx   . Arthritis Mother   . Hypertension Mother     History   Social History  . Marital Status: Single    Spouse Name: N/A  . Number of Children: 2  . Years of Education: 14   Occupational History  . 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 Smoking status: Current Every Day Smoker -- 0.50 packs/day for 30 years    Types: Cigarettes  . Smokeless tobacco: Never Used  . Alcohol Use: No  . Drug Use: Yes    Special: Cocaine     Comment: Heroine  . Sexual Activity: Not on file   Other Topics Concern  . Not on file   Social History Narrative   Born and raised in [place], [place]. Fun: Doesn't do a thing - never leaves the house.   Denies religious beliefs effecting health care.      Review of Systems  Constitutional: Negative for fever and chills.  Eyes:       Denies changes in vision  Endocrine: Positive for polyuria. Negative  for polydipsia and polyphagia.  Neurological: Positive for numbness.  Psychiatric/Behavioral: Positive for sleep disturbance and dysphoric mood. The patient is nervous/anxious.       Objective:    BP 130/70 mmHg  Pulse 130  Temp(Src) 98.1 F (36.7 C) (Oral)  Resp 18  Ht  (1.753 m)  Wt 306 lb 12.8 oz (139.164 kg)  BMI 45.29 kg/m2  SpO2 95% Nursing note and vital signs reviewed.  Physical Exam  Constitutional: She is oriented to person, place, and time. She appears well-developed and well-nourished. No distress.  Obese female seated in the chair, tearful, appears her stated age and is dressed appropriately for the situation.   Cardiovascular: Normal rate, regular rhythm, normal heart sounds and intact distal pulses.   Pulmonary/Chest: Effort normal and breath sounds normal.    Musculoskeletal:  Diabetic foot exam - bilateral feet with moderate edema. No skin breakdown noted. Pulses are intact. Sensation is decreased to monofilament bilaterally.    Neurological: She is alert and oriented to person, place, and time.  Skin: Skin is warm and dry.  Psychiatric: She has a normal mood and affect. Her behavior is normal. Judgment and thought content normal.       Assessment & Plan:

## 2014-12-26 NOTE — Patient Instructions (Signed)
Thank you for choosing [facility].  Summary/Instructions:  Your prescription(s) have been submitted to your pharmacy or been printed and provided for you. Please take as directed and contact our office if you believe you are having problem(s) with the medication(s) or have any questions.  If your symptoms worsen or fail to improve, please contact our office for further instruction, or in case of emergency go directly to the emergency room at the closest medical facility.    Type 2 Diabetes Mellitus Type 2 diabetes mellitus, often simply referred to as type 2 diabetes, is a long-lasting (chronic) disease. In type 2 diabetes, the pancreas does not make enough insulin (a hormone), the cells are less responsive to the insulin that is made (insulin resistance), or both. Normally, insulin moves sugars from food into the tissue cells. The tissue cells use the sugars for energy. The lack of insulin or the lack of normal response to insulin causes excess sugars to build up in the blood instead of going into the tissue cells. As a result, high blood sugar (hyperglycemia) develops. The effect of high sugar (glucose) levels can cause many complications. Type 2 diabetes was also previously called adult-onset diabetes, but it can occur at any age.  RISK FACTORS  A person is predisposed to developing type 2 diabetes if someone in the family has the disease and also has one or more of the following primary risk factors:  Overweight.  An inactive lifestyle.  A history of consistently eating high-calorie foods. Maintaining a normal weight and regular physical activity can reduce the chance of developing type 2 diabetes. SYMPTOMS  A person with type 2 diabetes may not show symptoms initially. The symptoms of type 2 diabetes appear slowly. The symptoms include:  Increased thirst (polydipsia).  Increased urination (polyuria).  Increased urination during the night (nocturia).  Weight loss. This  weight loss may be rapid.  Frequent, recurring infections.  Tiredness (fatigue).  Weakness.  Vision changes, such as blurred vision.  Fruity smell to your breath.  Abdominal pain.  Nausea or vomiting.  Cuts or bruises which are slow to heal.  Tingling or numbness in the hands or feet. DIAGNOSIS Type 2 diabetes is frequently not diagnosed until complications of diabetes are present. Type 2 diabetes is diagnosed when symptoms or complications are present and when blood glucose levels are increased. Your blood glucose level may be checked by one or more of the following blood tests:  A fasting blood glucose test. You will not be allowed to eat for at least 8 hours before a blood sample is taken.  A random blood glucose test. Your blood glucose is checked at any time of the day regardless of when you ate.  A hemoglobin A1c blood glucose test. A hemoglobin A1c test provides information about blood glucose control over the previous 3 months.  An oral glucose tolerance test (OGTT). Your blood glucose is measured after you have not eaten (fasted) for 2 hours and then after you drink a glucose-containing beverage. TREATMENT   You may need to take insulin or diabetes medicine daily to keep blood glucose levels in the desired range.  If you use insulin, you may need to adjust the dosage depending on the carbohydrates that you eat with each meal or snack. The treatment goal is to maintain the before meal blood sugar (preprandial glucose) level at 70-130 mg/dL. HOME CARE INSTRUCTIONS   Have your hemoglobin A1c level checked twice a year.  Perform daily blood glucose monitoring as   directed by your health care provider.  Monitor urine ketones when you are ill and as directed by your health care provider.  Take your diabetes medicine or insulin as directed by your health care provider to maintain your blood glucose levels in the desired range.  Never run out of diabetes medicine or  insulin. It is needed every day.  If you are using insulin, you may need to adjust the amount of insulin given based on your intake of carbohydrates. Carbohydrates can raise blood glucose levels but need to be included in your diet. Carbohydrates provide vitamins, minerals, and fiber which are an essential part of a healthy diet. Carbohydrates are found in fruits, vegetables, whole grains, dairy products, legumes, and foods containing added sugars.  Eat healthy foods. You should make an appointment to see a registered dietitian to help you create an eating plan that is right for you.  Lose weight if you are overweight.  Carry a medical alert card or wear your medical alert jewelry.  Carry a 15-gram carbohydrate snack with you at all times to treat low blood glucose (hypoglycemia). Some examples of 15-gram carbohydrate snacks include:  Glucose tablets, 3 or 4.  Glucose gel, 15-gram tube.  Raisins, 2 tablespoons (24 grams).  Jelly beans, 6.  Animal crackers, 8.  Regular pop, 4 ounces (120 mL).  Gummy treats, 9.  Recognize hypoglycemia. Hypoglycemia occurs with blood glucose levels of 70 mg/dL and below. The risk for hypoglycemia increases when fasting or skipping meals, during or after intense exercise, and during sleep. Hypoglycemia symptoms can include:  Tremors or shakes.  Decreased ability to concentrate.  Sweating.  Increased heart rate.  Headache.  Dry mouth.  Hunger.  Irritability.  Anxiety.  Restless sleep.  Altered speech or coordination.  Confusion.  Treat hypoglycemia promptly. If you are alert and able to safely swallow, follow the 15:15 rule:  Take 15-20 grams of rapid-acting glucose or carbohydrate. Rapid-acting options include glucose gel, glucose tablets, or 4 ounces (120 mL) of fruit juice, regular soda, or low-fat milk.  Check your blood glucose level 15 minutes after taking the glucose.  Take 15-20 grams more of glucose if the repeat blood  glucose level is still 70 mg/dL or below.  Eat a meal or snack within 1 hour once blood glucose levels return to normal.  Be alert to feeling very thirsty and urinating more frequently than usual, which are early signs of hyperglycemia. An early awareness of hyperglycemia allows for prompt treatment. Treat hyperglycemia as directed by your health care provider.  Engage in at least 150 minutes of moderate-intensity physical activity a week, spread over at least 3 days of the week or as directed by your health care provider. In addition, you should engage in resistance exercise at least 2 times a week or as directed by your health care provider. Try to spend no more than 90 minutes at one time inactive.  Adjust your medicine and food intake as needed if you start a new exercise or sport.  Follow your sick-day plan anytime you are unable to eat or drink as usual.  Do not use any tobacco products including cigarettes, chewing tobacco, or electronic cigarettes. If you need help quitting, ask your health care provider.  Limit alcohol intake to no more than 1 drink per day for nonpregnant women and 2 drinks per day for men. You should drink alcohol only when you are also eating food. Talk with your health care provider whether alcohol is safe for  you. Tell your health care provider if you drink alcohol several times a week.  Keep all follow-up visits as directed by your health care provider. This is important.  Schedule an eye exam soon after the diagnosis of type 2 diabetes and then annually.  Perform daily skin and foot care. Examine your skin and feet daily for cuts, bruises, redness, nail problems, bleeding, blisters, or sores. A foot exam by a health care provider should be done annually.  Brush your teeth and gums at least twice a day and floss at least once a day. Follow up with your dentist regularly.  Share your diabetes management plan with your workplace or school.  Stay up-to-date  with immunizations. It is recommended that people with diabetes who are over 37 years old get the pneumonia vaccine. In some cases, two separate shots may be given. Ask your health care provider if your pneumonia vaccination is up-to-date.  Learn to manage stress.  Obtain ongoing diabetes education and support as needed.  Participate in or seek rehabilitation as needed to maintain or improve independence and quality of life. Request a physical or occupational therapy referral if you are having foot or hand numbness, or difficulties with grooming, dressing, eating, or physical activity. SEEK MEDICAL CARE IF:   You are unable to eat food or drink fluids for more than 6 hours.  You have nausea and vomiting for more than 6 hours.  Your blood glucose level is over 240 mg/dL.  There is a change in mental status.  You develop an additional serious illness.  You have diarrhea for more than 6 hours.  You have been sick or have had a fever for a couple of days and are not getting better.  You have pain during any physical activity.  SEEK IMMEDIATE MEDICAL CARE IF:  You have difficulty breathing.  You have moderate to large ketone levels. MAKE SURE YOU:  Understand these instructions.  Will watch your condition.  Will get help right away if you are not doing well or get worse. Document Released: 09/20/2005 Document Revised: 02/04/2014 Document Reviewed: 04/18/2012 [facility] Patient Information 2015 [place], [place]. This information is not intended to replace advice given to you by your health care provider. Make sure you discuss any questions you have with your health care provider.  Citalopram tablets What is this medicine? CITALOPRAM (sye TAL oh pram) is a medicine for depression. This medicine may be used for other purposes; ask your health care provider or pharmacist if you have questions. COMMON BRAND NAME(S): Celexa What should I tell my health care provider before I take this  medicine? They need to know if you have any of these conditions: -bipolar disorder or a family history of bipolar disorder -diabetes -glaucoma -heart disease -history of irregular heartbeat -kidney or liver disease -low levels of magnesium or potassium in the blood -receiving electroconvulsive therapy -seizures (convulsions) -suicidal thoughts or a previous suicide attempt -an unusual or allergic reaction to citalopram, escitalopram, other medicines, foods, dyes, or preservatives -pregnant or trying to become pregnant -breast-feeding How should I use this medicine? Take this medicine by mouth with a glass of water. Follow the directions on the prescription label. You can take it with or without food. Take your medicine at regular intervals. Do not take your medicine more often than directed. Do not stop taking this medicine suddenly except upon the advice of your doctor. Stopping this medicine too quickly may cause serious side effects or your condition may worsen. A special  MedGuide will be given to you by the pharmacist with each prescription and refill. Be sure to read this information carefully each time. Talk to your pediatrician regarding the use of this medicine in children. Special care may be needed. Patients over 62 years old may have a stronger reaction and need a smaller dose. Overdosage: If you think you have taken too much of this medicine contact a poison control center or emergency room at once. NOTE: This medicine is only for you. Do not share this medicine with others. What if I miss a dose? If you miss a dose, take it as soon as you can. If it is almost time for your next dose, take only that dose. Do not take double or extra doses. What may interact with this medicine? Do not take this medicine with any of the following medications: -certain medicines for fungal infections like fluconazole, itraconazole, ketoconazole, posaconazole,  voriconazole -cisapride -dofetilide -dronedarone -escitalopram -linezolid -MAOIs like Carbex, Eldepryl, Marplan, Nardil, and Parnate -methylene blue (injected into a vein) -pimozide -thioridazine -ziprasidone This medicine may also interact with the following medications: -alcohol -aspirin and aspirin-like medicines -carbamazepine -certain medicines for depression, anxiety, or psychotic disturbances -certain medicines for infections like chloroquine, clarithromycin, erythromycin, furazolidone, isoniazid, pentamidine -certain medicines for migraine headaches like almotriptan, eletriptan, frovatriptan, naratriptan, rizatriptan, sumatriptan, zolmitriptan -certain medicines for sleep -certain medicines that treat or prevent blood clots like dalteparin, enoxaparin, warfarin -cimetidine -diuretics -fentanyl -lithium -methadone -metoprolol -NSAIDs, medicines for pain and inflammation, like ibuprofen or naproxen -omeprazole -other medicines that prolong the QT interval (cause an abnormal heart rhythm) -procarbazine -rasagiline -supplements like St. John's wort, kava kava, valerian -tramadol -tryptophan This list may not describe all possible interactions. Give your health care provider a list of all the medicines, herbs, non-prescription drugs, or dietary supplements you use. Also tell them if you smoke, drink alcohol, or use illegal drugs. Some items may interact with your medicine. What should I watch for while using this medicine? Tell your doctor if your symptoms do not get better or if they get worse. Visit your doctor or health care professional for regular checks on your progress. Because it may take several weeks to see the full effects of this medicine, it is important to continue your treatment as prescribed by your doctor. Patients and their families should watch out for new or worsening thoughts of suicide or depression. Also watch out for sudden changes in feelings such as  feeling anxious, agitated, panicky, irritable, hostile, aggressive, impulsive, severely restless, overly excited and hyperactive, or not being able to sleep. If this happens, especially at the beginning of treatment or after a change in dose, call your health care professional. Bonita Quin may get drowsy or dizzy. Do not drive, use machinery, or do anything that needs mental alertness until you know how this medicine affects you. Do not stand or sit up quickly, especially if you are an older patient. This reduces the risk of dizzy or fainting spells. Alcohol may interfere with the effect of this medicine. Avoid alcoholic drinks. Your mouth may get dry. Chewing sugarless gum or sucking hard candy, and drinking plenty of water will help. Contact your doctor if the problem does not go away or is severe. What side effects may I notice from receiving this medicine? Side effects that you should report to your doctor or health care professional as soon as possible: -allergic reactions like skin rash, itching or hives, swelling of the face, lips, or tongue -chest pain -confusion -dizziness -fast,  irregular heartbeat -fast talking and excited feelings or actions that are out of control -feeling faint or lightheaded, falls -hallucination, loss of contact with reality -seizures -shortness of breath -suicidal thoughts or other mood changes -unusual bleeding or bruising Side effects that usually do not require medical attention (report to your doctor or health care professional if they continue or are bothersome): -blurred vision -change in appetite -change in sex drive or performance -headache -increased sweating -nausea -trouble sleeping This list may not describe all possible side effects. Call your doctor for medical advice about side effects. You may report side effects to FDA at 1-800-FDA-1088. Where should I keep my medicine? Keep out of reach of children. Store at room temperature between 15 and 30  degrees C (59 and 86 degrees F). Throw away any unused medicine after the expiration date. NOTE: This sheet is a summary. It may not cover all possible information. If you have questions about this medicine, talk to your doctor, pharmacist, or health care provider.  2015, [employer]. (2013-04-13 13:19:48) Ramelteon tablets What is this medicine? RAMELTEON (ram EL tee on) is used to treat insomnia. This medicine helps you to fall asleep. This medicine may be used for other purposes; ask your health care provider or pharmacist if you have questions. COMMON BRAND NAME(S): Rozerem What should I tell my health care provider before I take this medicine? They need to know if you have any of these conditions: -depression -history of a drug or alcohol abuse problem -liver disease -lung or breathing disease -suicidal thoughts -an unusual or allergic reaction to ramelteon, other medicines, foods, dyes, or preservatives -pregnant or trying to get pregnant -breast-feeding How should I use this medicine? Take this medicine by mouth with a glass of water. Do not break tablets; swallow whole. Follow the directions on the prescription label. It is better to take this medicine on an empty stomach and only when you are ready for bed. Do not take your medicine more often than directed. A special MedGuide will be given to you by the pharmacist with each prescription and refill. Be sure to read this information carefully each time. Talk to your pediatrician regarding the use of this medicine in children. Special care may be needed. Overdosage: If you think you have taken too much of this medicine contact a poison control center or emergency room at once. NOTE: This medicine is only for you. Do not share this medicine with others. What if I miss a dose? This does not apply. This medicine should only be taken immediately before going to sleep. Do not take double or extra doses. What may interact with  this medicine? Do not take this medicine with any of the following medications: -fluvoxamine -melatonin This medicine may also interact with the following medications: -medicines used to treat fungal infections like ketoconazole, fluconazole, or itraconazole -rifampin This list may not describe all possible interactions. Give your health care provider a list of all the medicines, herbs, non-prescription drugs, or dietary supplements you use. Also tell them if you smoke, drink alcohol, or use illegal drugs. Some items may interact with your medicine. What should I watch for while using this medicine? Visit your doctor or health care professional for regular checks on your progress. If your condition gets worse after you start taking this medicine or you notice any different emotions or behaviors (like anxiety, depression, excessive excitement, or mania), you should contact your health care provider right away. Do not take this medicine unless you are  able to get a full night's sleep before you must be active again. You may not be able to remember things that you do in the hours after you take this medicine. Some people have reported driving, making phone calls, or preparing and eating food while asleep after taking sleep medicine. Take this medicine right before going to sleep. Tell your doctor if you are have any problems with your memory. You may get drowsy or dizzy. Do not drive, use machinery, or do anything that needs mental alertness until you know how this medicine affects you. Do not stand or sit up quickly, especially if you are an older patient. This reduces the risk of dizzy or fainting spells. Alcohol may interfere with the effect of this medicine. Avoid alcoholic drinks. What side effects may I notice from receiving this medicine? Side effects that you should report to your doctor or health care professional as soon as possible: -allergic reactions like skin rash, itching or hives, swelling  of the face, lips, or tongue -breast milk production or discharge -breathing problems -joint or muscle pain -depression, suicidal thoughts -missed monthly period (for women) -unusual activities while asleep like driving, eating, making phone calls -unusually weak or tired -worsening of insomnia Side effects that usually do not require medical attention (report to your doctor or health care professional if they continue or are bothersome): -bad taste -daytime sleepiness -decreased sex drive -diarrhea -headache -nausea This list may not describe all possible side effects. Call your doctor for medical advice about side effects. You may report side effects to FDA at 1-800-FDA-1088. Where should I keep my medicine? Keep out of the reach of children. Store at room temperature between 15 and 30 degrees C (59 and 86 degrees F). Keep the container tightly closed. Protect from moisture and humidity. Throw away any unused medicine after the expiration date. NOTE: This sheet is a summary. It may not cover all possible information. If you have questions about this medicine, talk to your doctor, pharmacist, or health care provider.  2015, [employer]. (2010-05-12 17:33:27)

## 2015-01-20 ENCOUNTER — Other Ambulatory Visit: Payer: Self-pay | Admitting: Family

## 2015-01-20 ENCOUNTER — Encounter: Payer: Self-pay | Admitting: Family

## 2015-01-20 MED ORDER — METFORMIN HCL 500 MG PO TABS: 500.0000 mg | ORAL_TABLET | Freq: Two times a day (BID) | ORAL | Status: DC

## 2015-01-20 MED ORDER — CITALOPRAM HYDROBROMIDE 20 MG PO TABS: 20.0000 mg | ORAL_TABLET | Freq: Every day | ORAL | Status: DC

## 2015-01-20 MED ORDER — RAMELTEON 8 MG PO TABS: 8.0000 mg | ORAL_TABLET | Freq: Every day | ORAL | Status: DC

## 2015-02-23 ENCOUNTER — Emergency Department (HOSPITAL_COMMUNITY): Payer: Self-pay

## 2015-02-23 ENCOUNTER — Emergency Department (HOSPITAL_COMMUNITY)
Admission: EM | Admit: 2015-02-23 | Discharge: 2015-02-23 | Disposition: A | Discharge status: Home / Self Care | Payer: Self-pay | Attending: Emergency Medicine | Admitting: Emergency Medicine

## 2015-02-23 ENCOUNTER — Encounter (HOSPITAL_COMMUNITY): Payer: Self-pay

## 2015-02-23 DIAGNOSIS — W010XXA Fall on same level from slipping, tripping and stumbling without subsequent striking against object, initial encounter: Secondary | ICD-10-CM | POA: Insufficient documentation

## 2015-02-23 DIAGNOSIS — Y9389 Activity, other specified: Secondary | ICD-10-CM | POA: Insufficient documentation

## 2015-02-23 DIAGNOSIS — S91115A Laceration without foreign body of left lesser toe(s) without damage to nail, initial encounter: Secondary | ICD-10-CM | POA: Insufficient documentation

## 2015-02-23 DIAGNOSIS — I10 Essential (primary) hypertension: Secondary | ICD-10-CM | POA: Insufficient documentation

## 2015-02-23 DIAGNOSIS — Z720 Tobacco use: Secondary | ICD-10-CM | POA: Insufficient documentation

## 2015-02-23 DIAGNOSIS — S299XXA Unspecified injury of thorax, initial encounter: Secondary | ICD-10-CM | POA: Insufficient documentation

## 2015-02-23 DIAGNOSIS — Z23 Encounter for immunization: Secondary | ICD-10-CM | POA: Insufficient documentation

## 2015-02-23 DIAGNOSIS — Y998 Other external cause status: Secondary | ICD-10-CM | POA: Insufficient documentation

## 2015-02-23 DIAGNOSIS — F410 Panic disorder [episodic paroxysmal anxiety] without agoraphobia: Secondary | ICD-10-CM | POA: Insufficient documentation

## 2015-02-23 DIAGNOSIS — W19XXXA Unspecified fall, initial encounter: Secondary | ICD-10-CM

## 2015-02-23 DIAGNOSIS — Y92009 Unspecified place in unspecified non-institutional (private) residence as the place of occurrence of the external cause: Secondary | ICD-10-CM | POA: Insufficient documentation

## 2015-02-23 DIAGNOSIS — Z8719 Personal history of other diseases of the digestive system: Secondary | ICD-10-CM | POA: Insufficient documentation

## 2015-02-23 DIAGNOSIS — S99912A Unspecified injury of left ankle, initial encounter: Secondary | ICD-10-CM | POA: Insufficient documentation

## 2015-02-23 DIAGNOSIS — S91202A Unspecified open wound of left great toe with damage to nail, initial encounter: Secondary | ICD-10-CM | POA: Insufficient documentation

## 2015-02-23 DIAGNOSIS — Z87440 Personal history of urinary (tract) infections: Secondary | ICD-10-CM | POA: Insufficient documentation

## 2015-02-23 DIAGNOSIS — E119 Type 2 diabetes mellitus without complications: Secondary | ICD-10-CM | POA: Insufficient documentation

## 2015-02-23 DIAGNOSIS — Z79899 Other long term (current) drug therapy: Secondary | ICD-10-CM | POA: Insufficient documentation

## 2015-02-23 DIAGNOSIS — Z8669 Personal history of other diseases of the nervous system and sense organs: Secondary | ICD-10-CM | POA: Insufficient documentation

## 2015-02-23 DIAGNOSIS — G8929 Other chronic pain: Secondary | ICD-10-CM | POA: Insufficient documentation

## 2015-02-23 MED ORDER — TETANUS-DIPHTH-ACELL PERTUSSIS 5-2.5-18.5 LF-MCG/0.5 IM SUSP
0.5000 mL | Freq: Once | INTRAMUSCULAR | Status: AC
  Administered 2015-02-23: 0.5 mL via INTRAMUSCULAR
  Filled 2015-02-23: qty 0.5

## 2015-02-23 MED ORDER — HYDROMORPHONE HCL 1 MG/ML IJ SOLN
1.0000 mg | Freq: Once | INTRAMUSCULAR | Status: AC
Start: 2015-02-23 — End: 2015-02-23
  Administered 2015-02-23: 1 mg via INTRAVENOUS
  Filled 2015-02-23: qty 1

## 2015-02-23 MED ORDER — HYDROMORPHONE HCL 1 MG/ML IJ SOLN: 1.0000 mg | Freq: Once | INTRAMUSCULAR | Status: DC

## 2015-02-23 MED ORDER — IBUPROFEN 800 MG PO TABS
800.0000 mg | ORAL_TABLET | Freq: Once | ORAL | Status: AC
  Administered 2015-02-23: 800 mg via ORAL
  Filled 2015-02-23: qty 1

## 2015-02-23 NOTE — ED Notes (Addendum)
Pt reports that she does not have a ride home. She laughs at this RN  when advised to follow up with PCP in the next several days. Motrin and tylenol recommended for management

## 2015-02-23 NOTE — ED Notes (Addendum)
Pt transported to XRAY °

## 2015-02-23 NOTE — ED Provider Notes (Signed)
CSN: [id number]     Arrival date & time 02/23/15  0034 History   First MD Initiated Contact with Patient 02/23/15 0054     Chief Complaint  Patient presents with  . Fall  . Knee Pain  . rib cage pain   . Laceration     (Consider location/radiation/quality/duration/timing/severity/associated sxs/prior Treatment) HPI  Claudia Erickson is a 49 y.o. female with past history of diabetes, hypertension, substance abuse presenting tonight after a fall. Patient states this occurred medially prior to arrival. She ablates with a cane and was walking down a ramp in front of her home. Patient states the floor was slippery and she fell. She is having pain on her left foot, ankle, and chest wall. She was given fentanyl by EMS with good pain control. Patient states that her tetanus is not up-to-date currently. She denies hitting her head or loss of consciousness. Patient has no neurological symptoms. She has no further complaints.  10 Systems reviewed and are negative for acute change except as noted in the HPI.    Past Medical History  Diagnosis Date  . Diabetes mellitus without complication   . Hypertension   . Chronic back pain   . Peripheral neuropathy   . Substance abuse   . Substance induced mood disorder   . Anxiety and depression   . Panic attack   . GERD (gastroesophageal reflux disease)   . Positive TB test   . UTI (lower urinary tract infection)    Past Surgical History  Procedure Laterality Date  . Abdominal hysterectomy    . Cholecystectomy    . Rotator cuff repair    . Tonsillectomy     Family History  Problem Relation Age of Onset  . Diabetes Mellitus II Father   . Colon cancer Father   . Hyperlipidemia Father   . Diabetes Father   . CAD Neg Hx   . Arthritis Mother   . Hypertension Mother    History  Substance Use Topics  . Smoking status: Current Every Day Smoker -- 0.50 packs/day for 30 years    Types: Cigarettes  . Smokeless tobacco: Never Used  . Alcohol Use:  No   OB History    No data available     Review of Systems    Allergies  Review of patient's allergies indicates no known allergies.  Home Medications   Prior to Admission medications   Medication Sig Start Date End Date Taking? Authorizing Provider  citalopram (CELEXA) 20 MG tablet Take 1 tablet (20 mg total) by mouth daily. 01/20/15   Veryl Speak, FNP  metFORMIN (GLUCOPHAGE) 500 MG tablet Take 1 tablet (500 mg total) by mouth 2 (two) times daily with a meal. 01/20/15   Veryl Speak, FNP  ramelteon (ROZEREM) 8 MG tablet Take 1 tablet (8 mg total) by mouth at bedtime. 01/20/15   Veryl Speak, FNP   BP 129/88 mmHg  Pulse 93  Temp(Src) 98.2 F (36.8 C) (Oral)  Resp 22  SpO2 94% Physical Exam  Constitutional: She is oriented to person, place, and time. She appears well-developed and well-nourished. She appears distressed.  HENT:  Head: Normocephalic and atraumatic.  Nose: Nose normal.  Mouth/Throat: Oropharynx is clear and moist. No oropharyngeal exudate.  Eyes: Conjunctivae and EOM are normal. Pupils are equal, round, and reactive to light. No scleral icterus.  Neck: Normal range of motion. Neck supple. No JVD present. No tracheal deviation present. No thyromegaly present.  Cardiovascular: Normal rate, regular  rhythm and normal heart sounds.  Exam reveals no gallop and no friction rub.   No murmur heard. Pulmonary/Chest: Effort normal and breath sounds normal. No respiratory distress. She has no wheezes. She exhibits tenderness.  Tenderness palpation of the left anterior chest wall, mild streaking erythema seen as well.  Abdominal: Soft. Bowel sounds are normal. She exhibits no distension and no mass. There is no tenderness. There is no rebound and no guarding.  Musculoskeletal: Normal range of motion. She exhibits tenderness. She exhibits no edema.  Nail has been ripped off the left great toe. Hemostasis is present. There is a 2 cm laceration to the base of the left  second toe. No active hemorrhage. There is tenderness to palpation of the left ankle with mild swelling. There are 2+ pulses in the dorsalis pedis on the left, normal sensation.  Lymphadenopathy:    She has no cervical adenopathy.  Neurological: She is alert and oriented to person, place, and time. No cranial nerve deficit. She exhibits normal muscle tone.  Normal strength and sensation in all extremities.  Skin: Skin is warm and dry. No rash noted. She is not diaphoretic. No erythema. No pallor.  Nursing note and vitals reviewed.   ED Course  Procedures (including critical care time) Labs Review Labs Reviewed - No data to display  Imaging Review Dg Ribs Unilateral W/chest Left  02/23/2015   CLINICAL DATA:  Tripped and fell on wheelchair ramp, with left anterior rib cage pain. Initial encounter.  EXAM: LEFT RIBS AND CHEST - 3+ VIEW  COMPARISON:  Chest radiograph performed 06/05/2014  FINDINGS: No displaced rib fractures are seen.  The lungs are well-aerated. Vascular congestion is noted. There is no evidence of focal opacification, pleural effusion or pneumothorax.  The cardiomediastinal silhouette is borderline normal in size. No acute osseous abnormalities are seen.  IMPRESSION: 1. No displaced rib fracture seen. 2. Vascular congestion noted; lungs remain grossly clear.   Electronically Signed   By: Roanna RaiderJeffery  Chang M.D.   On: 02/23/2015 03:07   Dg Ankle Complete Left  02/23/2015   CLINICAL DATA:  Trip and fall on wheelchair ramp, now with left ankle pain.  EXAM: LEFT ANKLE COMPLETE - 3+ VIEW  COMPARISON:  None.  FINDINGS: No fracture or dislocation. The alignment and joint spaces are maintained. The ankle mortise is preserved. Diffuse soft tissue prominence is likely related to habitus of and edema. No radiopaque foreign body.  IMPRESSION: No fracture or dislocation of the left ankle.   Electronically Signed   By: Rubye OaksMelanie  Ehinger M.D.   On: 02/23/2015 03:08   Dg Foot Complete Left  02/23/2015    CLINICAL DATA:  Trip and fall on wheelchair ramp, now with left foot pain.  EXAM: LEFT FOOT - COMPLETE 3+ VIEW  COMPARISON:  None.  FINDINGS: No fracture or dislocation. The alignment and joint spaces are maintained. No erosion or periosteal reaction. Diffuse soft tissue prominence is likely related to habitus rather than pulmonary edema. No radiopaque foreign body.  IMPRESSION: No fracture or dislocation of the left foot.   Electronically Signed   By: Rubye OaksMelanie  Ehinger M.D.   On: 02/23/2015 03:07     EKG Interpretation None      MDM   Final diagnoses:  Fall    Patient presents emergency department after a fall. She is cooing distress and acute pain. She was given Dilaudid and Motrin for pain control. Will obtain x-rays of chest wall, left lower extremity. Tetanus shot was updated.  X-rays are negative for any fractures. Patient advised on proper wound care. Primary care follow-up was advised within 3 days. She is advised to take Motrin or Tylenol as needed for pain control. Her vital signs remain within her normal limits and she is safe for discharge.  Patient continues to have difficulty with ambulation.  She was offered social work consult but refuses. Patient has called her father to take her back home.  LACERATION REPAIR Performed by: Tomasita Crumble Authorized byTomasita Crumble Consent: Verbal consent obtained. Risks and benefits: risks, benefits and alternatives were discussed Consent given by: patient Patient identity confirmed: provided demographic data Prepped and Draped in normal sterile fashion Wound explored  Laceration Location: L 2nd toe  Laceration Length: 2 cm  No Foreign Bodies seen or palpated  Irrigation method: syringe Amount of cleaning: standard  Skin closure: dermabond  Patient tolerance: Patient tolerated the procedure well with no immediate complications.   Tomasita Crumble, MD 02/23/15 [phone]

## 2015-02-23 NOTE — ED Notes (Signed)
CBG: 276 

## 2015-02-23 NOTE — ED Notes (Signed)
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  Patient has left rib cage pain, left medial knee pain,,laceration, toenail removed from left great toe and laceration base of left second toe.  No LOC, did not strike head

## 2015-02-23 NOTE — ED Notes (Signed)
Social Work consult offered however, patient declined offer. Pt reports she will be calling her father to come get her.

## 2015-02-23 NOTE — ED Notes (Signed)
Bed: [id number] Expected date:  Expected time:  Means of arrival:  Comments: EMS 49 yo female/fall/left side pain/left lower rib, left foot lac-Fentanyl 100 mcg

## 2015-02-23 NOTE — Discharge Instructions (Signed)
Fall Prevention and Home Safety Claudia Erickson, your x-rays do not show any fractures. Take Tylenol or Motrin at home for pain and see your primary physician within 3 days for close follow-up. Laceration was closed with skin glue. Keep dry for 24 hours then use normal soap and water. If any symptoms worsen come back to the emergency department immediately. Thank you.   Falls cause injuries and can affect all age groups. It is possible to prevent falls.  HOW TO PREVENT FALLS  Wear shoes with rubber soles that do not have an opening for your toes.  Keep the inside and outside of your house well lit.  Use night lights throughout your home.  Remove clutter from floors.  Clean up floor spills.  Remove throw rugs or fasten them to the floor with carpet tape.  Do not place electrical cords across pathways.  Put grab bars by your tub, shower, and toilet. Do not use towel bars as grab bars.  Put handrails on both sides of the stairway. Fix loose handrails.  Do not climb on stools or stepladders, if possible.  Do not wax your floors.  Repair uneven or unsafe sidewalks, walkways, or stairs.  Keep items you use a lot within reach.  Be aware of pets.  Keep emergency numbers next to the telephone.  Put smoke detectors in your home and near bedrooms. Ask your doctor what other things you can do to prevent falls. Document Released: 07/17/2009 Document Revised: 03/21/2012 Document Reviewed: 12/21/2011 [facility] Patient Information 2015 [place], [place]. This information is not intended to replace advice given to you by your health care provider. Make sure you discuss any questions you have with your health care provider. Laceration Care, Adult A laceration is a cut that goes through all layers of the skin. The cut goes into the tissue beneath the skin. HOME CARE For stitches (sutures) or staples:  Keep the cut clean and dry.  If you have a bandage (dressing), change it at least once a day.  Change the bandage if it gets wet or dirty, or as told by your doctor.  Wash the cut with soap and water 2 times a day. Rinse the cut with water. Pat it dry with a clean towel.  Put a thin layer of medicated cream on the cut as told by your doctor.  You may shower after the first 24 hours. Do not soak the cut in water until the stitches are removed.  Only take medicines as told by your doctor.  Have your stitches or staples removed as told by your doctor. For skin adhesive strips:  Keep the cut clean and dry.  Do not get the strips wet. You may take a bath, but be careful to keep the cut dry.  If the cut gets wet, pat it dry with a clean towel.  The strips will fall off on their own. Do not remove the strips that are still stuck to the cut. For wound glue:  You may shower or take baths. Do not soak or scrub the cut. Do not swim. Avoid heavy sweating until the glue falls off on its own. After a shower or bath, pat the cut dry with a clean towel.  Do not put medicine on your cut until the glue falls off.  If you have a bandage, do not put tape over the glue.  Avoid lots of sunlight or tanning lamps until the glue falls off. Put sunscreen on the cut for the first year  to reduce your scar.  The glue will fall off on its own. Do not pick at the glue. You may need a tetanus shot if:  You cannot remember when you had your last tetanus shot.  You have never had a tetanus shot. If you need a tetanus shot and you choose not to have one, you may get tetanus. Sickness from tetanus can be serious. GET HELP RIGHT AWAY IF:   Your pain does not get better with medicine.  Your arm, hand, leg, or foot loses feeling (numbness) or changes color.  Your cut is bleeding.  Your joint feels weak, or you cannot use your joint.  You have painful lumps on your body.  Your cut is red, puffy (swollen), or painful.  You have a red line on the skin near the cut.  You have yellowish-white fluid  (pus) coming from the cut.  You have a fever.  You have a bad smell coming from the cut or bandage.  Your cut breaks open before or after stitches are removed.  You notice something coming out of the cut, such as wood or glass.  You cannot move a finger or toe. MAKE SURE YOU:   Understand these instructions.  Will watch your condition.  Will get help right away if you are not doing well or get worse. Document Released: 03/08/2008 Document Revised: 12/13/2011 Document Reviewed: 03/16/2011 [facility] Patient Information 2015 [place]. This information is not intended to replace advice given to you by your health care provider. Make sure you discuss any questions you have with your health care provider.

## 2015-02-23 NOTE — ED Notes (Signed)
Patient received 50 mcg Fentanyl x 2 doses per EMS, bleeding controlled left second toe

## 2015-02-23 NOTE — ED Notes (Signed)
Patient's father here to taker her home.

## 2015-02-24 LAB — CBG MONITORING, ED: Glucose-Capillary: 276 mg/dL — ABNORMAL HIGH (ref 65–99)

## 2015-03-26 ENCOUNTER — Ambulatory Visit: Payer: Self-pay | Admitting: Family

## 2015-03-31 ENCOUNTER — Other Ambulatory Visit: Payer: Self-pay

## 2015-04-01 ENCOUNTER — Ambulatory Visit: Payer: Self-pay | Admitting: Family

## 2015-04-08 ENCOUNTER — Ambulatory Visit (INDEPENDENT_AMBULATORY_CARE_PROVIDER_SITE_OTHER): Payer: Self-pay | Admitting: Family

## 2015-04-08 ENCOUNTER — Encounter: Payer: Self-pay | Admitting: Family

## 2015-04-08 VITALS — BP 154/98 | HR 112 | Temp 98.0°F | Resp 20 | Ht 69.0 in | Wt 315.8 lb

## 2015-04-08 DIAGNOSIS — F32A Depression, unspecified: Secondary | ICD-10-CM

## 2015-04-08 DIAGNOSIS — E1149 Type 2 diabetes mellitus with other diabetic neurological complication: Secondary | ICD-10-CM

## 2015-04-08 DIAGNOSIS — F418 Other specified anxiety disorders: Secondary | ICD-10-CM

## 2015-04-08 DIAGNOSIS — R350 Frequency of micturition: Secondary | ICD-10-CM

## 2015-04-08 DIAGNOSIS — F329 Major depressive disorder, single episode, unspecified: Secondary | ICD-10-CM

## 2015-04-08 DIAGNOSIS — F419 Anxiety disorder, unspecified: Secondary | ICD-10-CM

## 2015-04-08 LAB — POCT URINALYSIS DIPSTICK
Bilirubin, UA: NEGATIVE
Ketones, UA: NEGATIVE
Nitrite, UA: NEGATIVE
Spec Grav, UA: 1.025
Urobilinogen, UA: NEGATIVE
pH, UA: 6

## 2015-04-08 MED ORDER — SULFAMETHOXAZOLE-TRIMETHOPRIM 800-160 MG PO TABS: 1.0000 | ORAL_TABLET | Freq: Two times a day (BID) | ORAL | Status: DC

## 2015-04-08 MED ORDER — GABAPENTIN 300 MG PO CAPS: 300.0000 mg | ORAL_CAPSULE | Freq: Three times a day (TID) | ORAL | Status: DC

## 2015-04-08 NOTE — Patient Instructions (Signed)
Thank you for choosing [facility].  Summary/Instructions:  Your prescription(s) have been submitted to your pharmacy or been printed and provided for you. Please take as directed and contact our office if you believe you are having problem(s) with the medication(s) or have any questions.  Please stop by the lab on the basement level of the building for your blood work. Your results will be released to MyChart (or called to you) after review, usually within 72 hours after test completion. If any changes need to be made, you will be notified at that same time.  If your symptoms worsen or fail to improve, please contact our office for further instruction, or in case of emergency go directly to the emergency room at the closest medical facility.    Urinary Tract Infection Urinary tract infections (UTIs) can develop anywhere along your urinary tract. Your urinary tract is your body's drainage system for removing wastes and extra water. Your urinary tract includes two kidneys, two ureters, a bladder, and a urethra. Your kidneys are a pair of bean-shaped organs. Each kidney is about the size of your fist. They are located below your ribs, one on each side of your spine. CAUSES Infections are caused by microbes, which are microscopic organisms, including fungi, viruses, and bacteria. These organisms are so small that they can only be seen through a microscope. Bacteria are the microbes that most commonly cause UTIs. SYMPTOMS  Symptoms of UTIs may vary by age and gender of the patient and by the location of the infection. Symptoms in young women typically include a frequent and intense urge to urinate and a painful, burning feeling in the bladder or urethra during urination. Older women and men are more likely to be tired, shaky, and weak and have muscle aches and abdominal pain. A fever may mean the infection is in your kidneys. Other symptoms of a kidney infection include pain in your back or sides  below the ribs, nausea, and vomiting. DIAGNOSIS To diagnose a UTI, your caregiver will ask you about your symptoms. Your caregiver also will ask to provide a urine sample. The urine sample will be tested for bacteria and white blood cells. White blood cells are made by your body to help fight infection. TREATMENT  Typically, UTIs can be treated with medication. Because most UTIs are caused by a bacterial infection, they usually can be treated with the use of antibiotics. The choice of antibiotic and length of treatment depend on your symptoms and the type of bacteria causing your infection. HOME CARE INSTRUCTIONS  If you were prescribed antibiotics, take them exactly as your caregiver instructs you. Finish the medication even if you feel better after you have only taken some of the medication.  Drink enough water and fluids to keep your urine clear or pale yellow.  Avoid caffeine, tea, and carbonated beverages. They tend to irritate your bladder.  Empty your bladder often. Avoid holding urine for long periods of time.  Empty your bladder before and after sexual intercourse.  After a bowel movement, women should cleanse from front to back. Use each tissue only once. SEEK MEDICAL CARE IF:   You have back pain.  You develop a fever.  Your symptoms do not begin to resolve within 3 days. SEEK IMMEDIATE MEDICAL CARE IF:   You have severe back pain or lower abdominal pain.  You develop chills.  You have nausea or vomiting.  You have continued burning or discomfort with urination. MAKE SURE YOU:   Understand   these instructions.  Will watch your condition.  Will get help right away if you are not doing well or get worse. Document Released: 06/30/2005 Document Revised: 03/21/2012 Document Reviewed: 10/29/2011 [employer] Patient Information 2015 [employer]. This information is not intended to replace advice given to you by your health care provider. Make sure you discuss any  questions you have with your health care provider.  

## 2015-04-08 NOTE — Progress Notes (Signed)
Subjective:    Patient ID: Claudia Erickson, female    DOB: [birth year], 49 y.o.   MRN: [id number]  Chief Complaint  Patient presents with  . Follow-up    A1c Check, celexa is working ok and would like to try something for anxiety, also has sxs of UTI, urinary frequency,     HPI:  Claudia Erickson is a 49 y.o. female with a PMH of Type 2 diabetes, polysubstance abuse, depression, and anxiety who presents today for an office follow up.  1.) Depression and anxiety - Previously seen in the office for anxiety and depression. Started on Celexa. Takes the medications as prescribed and denies adverse side effects. Indicates that the medication is working, however would like to additionally add something for anxiety. 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 Notes that her anxiety is "off the charts" and notes that she is not sleeping overly well. Describes difficulty falling asleep which increases her anxiety.   2.) UTI symptoms - This is a new problem. Associated symptoms of urinary frequency, urgency, and dysuria. Denies fevers or chills. Modifying factors include the AZO which has helped a little with the symptoms.  3.) Diabetes - Currently maintained on metformin. Takes the medication as prescribed and denies adverse side effects. Does note to miss the occasional dose of the evening. Has not been checking her blood sugar at homes. Does indicate that she is having peripheral neuropathy in her feet.   Lab Results  Component Value Date   HGBA1C 11.3* 12/06/2013    No Known Allergies   Current Outpatient Prescriptions on File Prior to Visit  Medication Sig Dispense Refill  . citalopram (CELEXA) 20 MG tablet Take 1 tablet (20 mg total) by mouth daily. 30 tablet 11  . metFORMIN (GLUCOPHAGE) 500 MG tablet Take 1 tablet (500 mg total) by mouth 2 (two) times daily with a meal. 60 tablet 11  . omeprazole  (PRILOSEC OTC) 20 MG tablet Take 20 mg by mouth daily.     No current facility-administered medications on file prior to visit.    Past Medical History  Diagnosis Date  . Diabetes mellitus without complication   . Hypertension   . Chronic back pain   . Peripheral neuropathy   . Substance abuse   . Substance induced mood disorder   . Anxiety and depression   . Panic attack   . GERD (gastroesophageal reflux disease)   . Positive TB test   . UTI (lower urinary tract infection)      Review of Systems  Constitutional: Negative for fever and chills.  Eyes:       Negative for changes in vision.   Respiratory: Negative for cough, chest tightness and shortness of breath.   Cardiovascular: Negative for chest pain, palpitations and leg swelling.  Genitourinary: Positive for dysuria, urgency and frequency. Negative for hematuria, flank pain and pelvic pain.  Neurological: Positive for numbness.      Objective:    BP 154/98 mmHg  Pulse 112  Temp(Src) 98 F (36.7 C) (Oral)  Resp 20  Ht  (1.753 m)  Wt 315 lb 12.8 oz (143.246 kg)  BMI 46.61 kg/m2  SpO2 95% Nursing note and vital signs reviewed.  Physical Exam  Constitutional: She is oriented to person, place, and time. She appears well-developed and well-nourished. No distress.  Cardiovascular: Normal rate, regular rhythm, normal heart sounds and  intact distal pulses.   Pulmonary/Chest: Effort normal and breath sounds normal.  Neurological: She is alert and oriented to person, place, and time.  Skin: Skin is warm and dry.  Psychiatric: She has a normal mood and affect. Her behavior is normal. Judgment and thought content normal.      Assessment & Plan:   Problem List Items Addressed This Visit      Endocrine   Diabetes mellitus    Currently maintained on metformin. Previous A1c of 11.3 and urinalysis for UTI symptoms positive for glucose. Obtain A1c to check current status. Does experience peripheral neuropathy, also  indicating increased blood sugars. Start gabapentin. May require insulin pending A1c results.      Relevant Medications   gabapentin (NEURONTIN) 300 MG capsule   Other Relevant Orders   Hemoglobin A1c     Other   Anxiety and depression    Continues to experience symptoms of depression and anxiety. Notes depression is improved with Celexa. Continue current dosage of Celexa. Expresses increased anxiety secondary to family stressors. Given previous history of polysubstance abuse, obtain urine drug screen. If urine drug screen is clean, consider starting Klonopin as needed for anxiety. Denies suicidal ideations. Follow-up in one month pending urine drug screen results.      Urinary frequency - Primary    Symptoms and in office urinalysis consistent with urinary tract infection. In office UA positive for leukocytes and hematuria while negative for nitrites. Start Bactrim. Continue over-the-counter medication as needed for symptom relief. Follow-up if symptoms worsen or fail to improve.      Relevant Medications   sulfamethoxazole-trimethoprim (BACTRIM DS,SEPTRA DS) 800-160 MG per tablet   Other Relevant Orders   POCT urinalysis dipstick (Completed)

## 2015-04-08 NOTE — Assessment & Plan Note (Signed)
Currently maintained on metformin. Previous A1c of 11.3 and urinalysis for UTI symptoms positive for glucose. Obtain A1c to check current status. Does experience peripheral neuropathy, also indicating increased blood sugars. Start gabapentin. May require insulin pending A1c results.

## 2015-04-08 NOTE — Assessment & Plan Note (Signed)
Symptoms and in office urinalysis consistent with urinary tract infection. In office UA positive for leukocytes and hematuria while negative for nitrites. Start Bactrim. Continue over-the-counter medication as needed for symptom relief. Follow-up if symptoms worsen or fail to improve.

## 2015-04-08 NOTE — Assessment & Plan Note (Signed)
Continues to experience symptoms of depression and anxiety. Notes depression is improved with Celexa. Continue current dosage of Celexa. Expresses increased anxiety secondary to family stressors. Given previous history of polysubstance abuse, obtain urine drug screen. If urine drug screen is clean, consider starting Klonopin as needed for anxiety. Denies suicidal ideations. Follow-up in one month pending urine drug screen results.

## 2015-04-08 NOTE — Progress Notes (Signed)
Pre visit review using our clinic review tool, if applicable. No additional management support is needed unless otherwise documented below in the visit note. 

## 2016-10-26 ENCOUNTER — Encounter (HOSPITAL_COMMUNITY): Payer: Self-pay | Admitting: Emergency Medicine

## 2016-10-26 ENCOUNTER — Emergency Department (HOSPITAL_COMMUNITY)
Admission: EM | Admit: 2016-10-26 | Discharge: 2016-10-27 | Disposition: A | Discharge status: Other Acute Inpatient Hospital | Payer: Federal, State, Local not specified - Other | Attending: Emergency Medicine | Admitting: Emergency Medicine

## 2016-10-26 DIAGNOSIS — E1140 Type 2 diabetes mellitus with diabetic neuropathy, unspecified: Secondary | ICD-10-CM | POA: Insufficient documentation

## 2016-10-26 DIAGNOSIS — Z79899 Other long term (current) drug therapy: Secondary | ICD-10-CM | POA: Insufficient documentation

## 2016-10-26 DIAGNOSIS — Y638 Failure in dosage during other surgical and medical care: Secondary | ICD-10-CM | POA: Insufficient documentation

## 2016-10-26 DIAGNOSIS — F332 Major depressive disorder, recurrent severe without psychotic features: Secondary | ICD-10-CM | POA: Diagnosis present

## 2016-10-26 DIAGNOSIS — I10 Essential (primary) hypertension: Secondary | ICD-10-CM | POA: Insufficient documentation

## 2016-10-26 DIAGNOSIS — Z7984 Long term (current) use of oral hypoglycemic drugs: Secondary | ICD-10-CM | POA: Insufficient documentation

## 2016-10-26 DIAGNOSIS — F17210 Nicotine dependence, cigarettes, uncomplicated: Secondary | ICD-10-CM | POA: Insufficient documentation

## 2016-10-26 DIAGNOSIS — R45851 Suicidal ideations: Secondary | ICD-10-CM

## 2016-10-26 DIAGNOSIS — T50902A Poisoning by unspecified drugs, medicaments and biological substances, intentional self-harm, initial encounter: Secondary | ICD-10-CM

## 2016-10-26 DIAGNOSIS — R739 Hyperglycemia, unspecified: Secondary | ICD-10-CM

## 2016-10-26 DIAGNOSIS — T43212A Poisoning by selective serotonin and norepinephrine reuptake inhibitors, intentional self-harm, initial encounter: Secondary | ICD-10-CM | POA: Insufficient documentation

## 2016-10-26 DIAGNOSIS — E1165 Type 2 diabetes mellitus with hyperglycemia: Secondary | ICD-10-CM | POA: Insufficient documentation

## 2016-10-26 LAB — RAPID URINE DRUG SCREEN, HOSP PERFORMED
Amphetamines: POSITIVE — AB
BARBITURATES: NOT DETECTED
Benzodiazepines: NOT DETECTED
Cocaine: POSITIVE — AB
Opiates: NOT DETECTED
Tetrahydrocannabinol: NOT DETECTED

## 2016-10-26 LAB — CBC WITH DIFFERENTIAL/PLATELET
BASOS PCT: 0 %
Basophils Absolute: 0 10*3/uL (ref 0.0–0.1)
EOS ABS: 0.1 10*3/uL (ref 0.0–0.7)
EOS PCT: 0 %
HCT: 44.5 % (ref 36.0–46.0)
HEMOGLOBIN: 15.5 g/dL — AB (ref 12.0–15.0)
Lymphocytes Relative: 20 %
Lymphs Abs: 2.3 10*3/uL (ref 0.7–4.0)
MCH: 29.3 pg (ref 26.0–34.0)
MCHC: 34.8 g/dL (ref 30.0–36.0)
MCV: 84.1 fL (ref 78.0–100.0)
MONO ABS: 0.7 10*3/uL (ref 0.1–1.0)
MONOS PCT: 6 %
NEUTROS PCT: 74 %
Neutro Abs: 8.3 10*3/uL — ABNORMAL HIGH (ref 1.7–7.7)
PLATELETS: 207 10*3/uL (ref 150–400)
RBC: 5.29 MIL/uL — ABNORMAL HIGH (ref 3.87–5.11)
RDW: 12.7 % (ref 11.5–15.5)
WBC: 11.3 10*3/uL — ABNORMAL HIGH (ref 4.0–10.5)

## 2016-10-26 LAB — COMPREHENSIVE METABOLIC PANEL
ALBUMIN: 3.5 g/dL (ref 3.5–5.0)
ALT: 21 U/L (ref 14–54)
AST: 38 U/L (ref 15–41)
Alkaline Phosphatase: 117 U/L (ref 38–126)
Anion gap: 10 (ref 5–15)
BUN: 12 mg/dL (ref 6–20)
CHLORIDE: 97 mmol/L — AB (ref 101–111)
CO2: 21 mmol/L — ABNORMAL LOW (ref 22–32)
Calcium: 8.3 mg/dL — ABNORMAL LOW (ref 8.9–10.3)
Creatinine, Ser: 0.63 mg/dL (ref 0.44–1.00)
GFR calc non Af Amer: >60 mL/min (ref >60)
GLUCOSE: 397 mg/dL — AB (ref 65–99)
POTASSIUM: 5 mmol/L (ref 3.5–5.1)
SODIUM: 128 mmol/L — AB (ref 135–145)
Total Bilirubin: 1.4 mg/dL — ABNORMAL HIGH (ref 0.3–1.2)
Total Protein: 6.4 g/dL — ABNORMAL LOW (ref 6.5–8.1)

## 2016-10-26 LAB — CBG MONITORING, ED
GLUCOSE-CAPILLARY: 319 mg/dL — AB (ref 65–99)
Glucose-Capillary: 279 mg/dL — ABNORMAL HIGH (ref 65–99)

## 2016-10-26 LAB — ETHANOL

## 2016-10-26 LAB — SALICYLATE LEVEL

## 2016-10-26 LAB — ACETAMINOPHEN LEVEL
Acetaminophen (Tylenol), Serum: <10 ug/mL — ABNORMAL LOW (ref 10–30)
Acetaminophen (Tylenol), Serum: <10 ug/mL — ABNORMAL LOW (ref 10–30)

## 2016-10-26 MED ORDER — ACETAMINOPHEN 325 MG PO TABS: 650.0000 mg | ORAL_TABLET | ORAL | Status: DC | PRN

## 2016-10-26 MED ORDER — OMEPRAZOLE 20 MG PO CPDR
20.0000 mg | DELAYED_RELEASE_CAPSULE | Freq: Every day | ORAL | Status: DC
  Administered 2016-10-26 – 2016-10-27 (×2): 20 mg via ORAL
  Filled 2016-10-26 (×2): qty 1

## 2016-10-26 MED ORDER — TRAZODONE HCL 100 MG PO TABS
150.0000 mg | ORAL_TABLET | Freq: Every day | ORAL | Status: DC
  Administered 2016-10-26: 150 mg via ORAL
  Filled 2016-10-26: qty 1

## 2016-10-26 MED ORDER — SULFAMETHOXAZOLE-TRIMETHOPRIM 800-160 MG PO TABS
1.0000 | ORAL_TABLET | Freq: Two times a day (BID) | ORAL | Status: DC
  Administered 2016-10-26: 1 via ORAL
  Filled 2016-10-26 (×2): qty 1

## 2016-10-26 MED ORDER — SODIUM CHLORIDE 0.9 % IV BOLUS (SEPSIS)
1000.0000 mL | Freq: Once | INTRAVENOUS | Status: AC
  Administered 2016-10-26: 1000 mL via INTRAVENOUS

## 2016-10-26 MED ORDER — IBUPROFEN 200 MG PO TABS: 600.0000 mg | ORAL_TABLET | Freq: Three times a day (TID) | ORAL | Status: DC | PRN

## 2016-10-26 MED ORDER — ALUM & MAG HYDROXIDE-SIMETH 200-200-20 MG/5ML PO SUSP: 30.0000 mL | ORAL | Status: DC | PRN

## 2016-10-26 MED ORDER — NICOTINE 21 MG/24HR TD PT24
21.0000 mg | MEDICATED_PATCH | Freq: Every day | TRANSDERMAL | Status: DC
  Administered 2016-10-27: 21 mg via TRANSDERMAL
  Filled 2016-10-26 (×2): qty 1

## 2016-10-26 MED ORDER — METFORMIN HCL 500 MG PO TABS
500.0000 mg | ORAL_TABLET | Freq: Two times a day (BID) | ORAL | Status: DC
  Administered 2016-10-26 – 2016-10-27 (×2): 500 mg via ORAL
  Filled 2016-10-26 (×2): qty 1

## 2016-10-26 MED ORDER — CITALOPRAM HYDROBROMIDE 10 MG PO TABS
20.0000 mg | ORAL_TABLET | Freq: Every day | ORAL | Status: DC
  Administered 2016-10-26 – 2016-10-27 (×2): 20 mg via ORAL
  Filled 2016-10-26: qty 2

## 2016-10-26 MED ORDER — GABAPENTIN 300 MG PO CAPS
300.0000 mg | ORAL_CAPSULE | Freq: Three times a day (TID) | ORAL | Status: DC
  Administered 2016-10-26 – 2016-10-27 (×3): 300 mg via ORAL
  Filled 2016-10-26 (×3): qty 1

## 2016-10-26 MED ORDER — ONDANSETRON HCL 4 MG PO TABS: 4.0000 mg | ORAL_TABLET | Freq: Three times a day (TID) | ORAL | Status: DC | PRN

## 2016-10-26 MED ORDER — IBUPROFEN 800 MG PO TABS: 800.0000 mg | ORAL_TABLET | Freq: Four times a day (QID) | ORAL | Status: DC | PRN

## 2016-10-26 MED ORDER — INSULIN ASPART 100 UNIT/ML ~~LOC~~ SOLN
10.0000 [IU] | Freq: Once | SUBCUTANEOUS | Status: AC
  Administered 2016-10-26: 10 [IU] via INTRAVENOUS
  Filled 2016-10-26: qty 1

## 2016-10-26 NOTE — ED Provider Notes (Signed)
[facility]-EMERGENCY DEPT Provider Note   CSN: [id number] Arrival date & time: 10/26/16  1140  History   Chief Complaint Chief Complaint  Patient presents with  . Suicidal  . Drug Overdose    HPI Claudia Erickson is a 51 y.o. female.  HPI   Pt with PMH of anxiety and depression, chronic back pain, GERD, diabetes, hypertension, panic attack, peripheral neuropathy, substance abuse, comes to the ER by EMS after overdose on Trazodone. Allegedly patient took 38-40 tabs of 150 mg Trazodone tabs. She is sleeping/drowsy. Per EMS she vomited at 100cc but they do not know if there were any  Pills in it. She says that that is all that she took and she has been depressed. The roommate is the one that called EMS. Pt is sleepy but awake, alert and talking to me.   Past Medical History:  Diagnosis Date  . Anxiety and depression   . Chronic back pain   . Diabetes mellitus without complication (HCC)   . GERD (gastroesophageal reflux disease)   . Hypertension   . Panic attack   . Peripheral neuropathy (HCC)   . Positive TB test   . Substance abuse   . Substance induced mood disorder (HCC)   . UTI (lower urinary tract infection)     Patient Active Problem List   Diagnosis Date Noted  . Urinary frequency 04/08/2015  . Anxiety and depression 12/26/2014  . Insomnia 12/26/2014  . MDD (major depressive disorder), recurrent severe, without psychosis (HCC) 08/27/2014  . Depressive disorder 07/28/2014  . Aspiration into airway 12/06/2013  . Aspiration pneumonia (HCC) 12/06/2013  . Polysubstance abuse 12/06/2013  . Diabetes mellitus (HCC) 12/06/2013  . Syncope 12/06/2013  . Leucocytosis 12/06/2013  . Acute respiratory failure (HCC) 12/06/2013  . Elevated LFTs 12/06/2013    Past Surgical History:  Procedure Laterality Date  . ABDOMINAL HYSTERECTOMY    . CHOLECYSTECTOMY    . ROTATOR CUFF REPAIR    . TONSILLECTOMY      OB History    No data available       Home Medications    Prior to  Admission medications   Medication Sig Start Date End Date Taking? Authorizing Provider  gabapentin (NEURONTIN) 300 MG capsule Take 1 capsule (300 mg total) by mouth 3 (three) times daily. Patient taking differently: Take 600 mg by mouth 3 (three) times daily.  04/08/15  Yes Veryl SpeakGregory D Calone, FNP  ibuprofen (ADVIL,MOTRIN) 200 MG tablet Take 800 mg by mouth every 6 (six) hours as needed.   Yes Historical Provider, MD  omeprazole (PRILOSEC OTC) 20 MG tablet Take 20 mg by mouth daily.   Yes Historical Provider, MD  traZODone (DESYREL) 100 MG tablet Take 150 mg by mouth at bedtime.   Yes Historical Provider, MD  citalopram (CELEXA) 20 MG tablet Take 1 tablet (20 mg total) by mouth daily. Patient not taking: Reported on 10/26/2016 01/20/15   Veryl SpeakGregory D Calone, FNP  metFORMIN (GLUCOPHAGE) 500 MG tablet Take 1 tablet (500 mg total) by mouth 2 (two) times daily with a meal. Patient not taking: Reported on 10/26/2016 01/20/15   Veryl SpeakGregory D Calone, FNP  sulfamethoxazole-trimethoprim (BACTRIM DS,SEPTRA DS) 800-160 MG per tablet Take 1 tablet by mouth 2 (two) times daily. Patient not taking: Reported on 10/26/2016 04/08/15   Veryl SpeakGregory D Calone, FNP    Family History Family History  Problem Relation Age of Onset  . Diabetes Mellitus II Father   . Colon cancer Father   . Hyperlipidemia Father   .  Diabetes Father   . Arthritis Mother   . Hypertension Mother   . CAD Neg Hx     Social History Social History  Substance Use Topics  . Smoking status: Current Every Day Smoker    Packs/day: 0.50    Years: 30.00    Types: Cigarettes  . Smokeless tobacco: Never Used  . Alcohol use No     Allergies   Patient has no known allergies.   Review of Systems Review of Systems  Review of Systems All other systems negative except as documented in the HPI. All pertinent positives and negatives as reviewed in the HPI.  Physical Exam Updated Vital Signs BP (!) 120/110 (BP Location: Right Arm)   Pulse 81   Temp 97.9  F (36.6 C) (Oral)   Resp 15   SpO2 93%   Physical Exam  Constitutional: She appears well-developed and well-nourished.  HENT:  Head: Normocephalic and atraumatic.  Eyes: Conjunctivae are normal. Pupils are equal, round, and reactive to light.  Neck: Trachea normal, normal range of motion and full passive range of motion without pain. Neck supple.  Cardiovascular: Normal rate, regular rhythm and normal pulses.   Pulmonary/Chest: Effort normal and breath sounds normal. Chest wall is not dull to percussion. She exhibits no tenderness, no crepitus, no edema, no deformity and no retraction.  Abdominal: Soft. Normal appearance and bowel sounds are normal.  Musculoskeletal: Normal range of motion.  Neurological: She is alert. She has normal strength.  Skin: Skin is warm, dry and intact.  Psychiatric: She has a normal mood and affect. Her speech is normal and behavior is normal. Judgment normal. She is not combative. Thought content is not paranoid and not delusional. Cognition and memory are normal. She expresses suicidal ideation. She expresses no homicidal ideation. She expresses suicidal plans. She expresses no homicidal plans.     ED Treatments / Results  Labs (all labs ordered are listed, but only abnormal results are displayed) Labs Reviewed  RAPID URINE DRUG SCREEN, HOSP PERFORMED - Abnormal; Notable for the following:       Result Value   Cocaine POSITIVE (*)    Amphetamines POSITIVE (*)    All other components within normal limits  ACETAMINOPHEN LEVEL - Abnormal; Notable for the following:    Acetaminophen (Tylenol), Serum <10 (*)    All other components within normal limits  CBC WITH DIFFERENTIAL/PLATELET - Abnormal; Notable for the following:    WBC 11.3 (*)    RBC 5.29 (*)    Hemoglobin 15.5 (*)    Neutro Abs 8.3 (*)    All other components within normal limits  COMPREHENSIVE METABOLIC PANEL - Abnormal; Notable for the following:    Sodium 128 (*)    Chloride 97 (*)     CO2 21 (*)    Glucose, Bld 397 (*)    Calcium 8.3 (*)    Total Protein 6.4 (*)    Total Bilirubin 1.4 (*)    All other components within normal limits  CBG MONITORING, ED - Abnormal; Notable for the following:    Glucose-Capillary 319 (*)    All other components within normal limits  ETHANOL  SALICYLATE LEVEL    EKG  EKG Interpretation  Date/Time:  Tuesday October 26 2016 11:57:06 EST Ventricular Rate:  80 PR Interval:    QRS Duration: 92 QT Interval:  413 QTC Calculation: 477 R Axis:   81 Text Interpretation:  Sinus rhythm Anteroseptal infarct, old Baseline wander in lead(s) V2 Confirmed by  KOHUT  MD, Jeannett Senior [phone]) on 10/26/2016 1:14:30 PM       Radiology No results found.  Procedures Procedures (including critical care time)  Medications Ordered in ED Medications  sodium chloride 0.9 % bolus 1,000 mL (not administered)  insulin aspart (novoLOG) injection 10 Units (not administered)  sodium chloride 0.9 % bolus 1,000 mL (1,000 mLs Intravenous New Bag/Given 10/26/16 1304)     Initial Impression / Assessment and Plan / ED Course  I have reviewed the triage vital signs and the nursing notes.  Pertinent labs & imaging results that were available during my care of the patient were reviewed by me and considered in my medical decision making (see chart for details).  I spoke with poison control, they recommend supportive care, IV fluids, cardiac monitoring, pulse oximetry, 4 hour Tylenol level and observation for 6 hours before medical clearance.  1:45 pm - pt continues to be awake, alert and oriented. Having to use the restroom q 30 minutes due to fluids to rehydrate and correct her glucose. She said that she is chemically depressed and that is what prompted today more than situational although her mom passed away last year and her dad is putting the house up for sale.   2: 45 pm - Patients CBG is 319. She continues to be awake, alert and oriented.  Ordered 10 units of  insulin. At end of shift patient sign out to Mier, K. PA-C. Recheck Tylenol at 4pm, if doing well medically cleared at 6pm.  Final Clinical Impressions(s) / ED Diagnoses   Final diagnoses:  Intentional drug overdose, initial encounter [facility])  Suicidal ideation  Hyperglycemia    New Prescriptions New Prescriptions   No medications on file     Darnelle Going 10/26/16 1509    Raeford Razor, MD 10/29/16 1046

## 2016-10-26 NOTE — ED Provider Notes (Signed)
4:32 PM Patient signed out to me at shift change. Patient in emergency department after an overdose on trazodone. 2 proximally 40 tablets of 150 mg. Patient is currently being monitored for any potential side effects. Plan to recheck Tylenol level right now, and monitor until 6 PM. Recheck Tylenol level is ordered. We'll continue to monitor patient.  6:30 PM Repeat tylenol level is <10. I reassessed patient. Patient is in no acute distress. Vital signs are normal. She is resting. Patient is medically cleared from overdose standpoint. TTS consulted  Vitals:   10/26/16 1149 10/26/16 1159 10/26/16 1610 10/26/16 2014  BP:  (!) 120/110 131/91 136/92  Pulse:  81 79 82  Resp:  15 18 17   Temp:  97.9 F (36.6 C) 98.3 F (36.8 C) 98.3 F (36.8 C)  TempSrc:  Oral  Oral  SpO2: 97% 93% 96% 94%        Jaynie Crumbleatyana Annmargaret Decaprio, PA-C 10/27/16 0009    Mancel BaleElliott Wentz, MD 10/27/16 0105

## 2016-10-26 NOTE — ED Triage Notes (Signed)
Pt took 38-40 of 150mg  of trazadone approx 1hr ago from this time. Pt reports that she was trying to harm herself after an argument with a family member. Pt reports sleep/drowsiness. Pt vomited abour 100cc unable to determine if pills were in emesis.

## 2016-10-26 NOTE — ED Notes (Signed)
Bed: [id number] Expected date:  Expected time:  Means of arrival:  Comments: EMS-OD
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
## 2016-10-27 ENCOUNTER — Encounter (HOSPITAL_COMMUNITY): Payer: Self-pay | Admitting: *Deleted

## 2016-10-27 ENCOUNTER — Inpatient Hospital Stay (HOSPITAL_COMMUNITY)
Admission: AD | Admit: 2016-10-27 | Discharge: 2016-11-05 | DRG: 885 | Disposition: A | Discharge status: Home / Self Care | Payer: Federal, State, Local not specified - Other | Source: Intra-hospital | Attending: Psychiatry | Admitting: Psychiatry

## 2016-10-27 ENCOUNTER — Encounter (HOSPITAL_COMMUNITY): Payer: Self-pay | Admitting: Registered Nurse

## 2016-10-27 ENCOUNTER — Emergency Department (HOSPITAL_COMMUNITY): Payer: Self-pay

## 2016-10-27 DIAGNOSIS — Z90710 Acquired absence of both cervix and uterus: Secondary | ICD-10-CM

## 2016-10-27 DIAGNOSIS — E871 Hypo-osmolality and hyponatremia: Secondary | ICD-10-CM | POA: Diagnosis present

## 2016-10-27 DIAGNOSIS — I10 Essential (primary) hypertension: Secondary | ICD-10-CM | POA: Diagnosis present

## 2016-10-27 DIAGNOSIS — T1491XA Suicide attempt, initial encounter: Secondary | ICD-10-CM

## 2016-10-27 DIAGNOSIS — Z9049 Acquired absence of other specified parts of digestive tract: Secondary | ICD-10-CM

## 2016-10-27 DIAGNOSIS — Z8261 Family history of arthritis: Secondary | ICD-10-CM

## 2016-10-27 DIAGNOSIS — F410 Panic disorder [episodic paroxysmal anxiety] without agoraphobia: Secondary | ICD-10-CM | POA: Diagnosis present

## 2016-10-27 DIAGNOSIS — Z8249 Family history of ischemic heart disease and other diseases of the circulatory system: Secondary | ICD-10-CM

## 2016-10-27 DIAGNOSIS — Z23 Encounter for immunization: Secondary | ICD-10-CM | POA: Diagnosis not present

## 2016-10-27 DIAGNOSIS — F332 Major depressive disorder, recurrent severe without psychotic features: Secondary | ICD-10-CM | POA: Diagnosis present

## 2016-10-27 DIAGNOSIS — E1149 Type 2 diabetes mellitus with other diabetic neurological complication: Secondary | ICD-10-CM | POA: Diagnosis not present

## 2016-10-27 DIAGNOSIS — Z800 Family history of malignant neoplasm of digestive organs: Secondary | ICD-10-CM | POA: Diagnosis not present

## 2016-10-27 DIAGNOSIS — Z9889 Other specified postprocedural states: Secondary | ICD-10-CM | POA: Diagnosis not present

## 2016-10-27 DIAGNOSIS — Z7984 Long term (current) use of oral hypoglycemic drugs: Secondary | ICD-10-CM | POA: Diagnosis not present

## 2016-10-27 DIAGNOSIS — E119 Type 2 diabetes mellitus without complications: Secondary | ICD-10-CM

## 2016-10-27 DIAGNOSIS — I872 Venous insufficiency (chronic) (peripheral): Secondary | ICD-10-CM | POA: Diagnosis present

## 2016-10-27 DIAGNOSIS — G8929 Other chronic pain: Secondary | ICD-10-CM | POA: Diagnosis present

## 2016-10-27 DIAGNOSIS — E1165 Type 2 diabetes mellitus with hyperglycemia: Secondary | ICD-10-CM | POA: Diagnosis present

## 2016-10-27 DIAGNOSIS — F17210 Nicotine dependence, cigarettes, uncomplicated: Secondary | ICD-10-CM

## 2016-10-27 DIAGNOSIS — Z833 Family history of diabetes mellitus: Secondary | ICD-10-CM

## 2016-10-27 DIAGNOSIS — T43212A Poisoning by selective serotonin and norepinephrine reuptake inhibitors, intentional self-harm, initial encounter: Secondary | ICD-10-CM | POA: Diagnosis not present

## 2016-10-27 DIAGNOSIS — E1142 Type 2 diabetes mellitus with diabetic polyneuropathy: Secondary | ICD-10-CM | POA: Diagnosis present

## 2016-10-27 DIAGNOSIS — K219 Gastro-esophageal reflux disease without esophagitis: Secondary | ICD-10-CM | POA: Diagnosis present

## 2016-10-27 DIAGNOSIS — R739 Hyperglycemia, unspecified: Secondary | ICD-10-CM

## 2016-10-27 DIAGNOSIS — T424X2A Poisoning by benzodiazepines, intentional self-harm, initial encounter: Secondary | ICD-10-CM | POA: Diagnosis not present

## 2016-10-27 DIAGNOSIS — M549 Dorsalgia, unspecified: Secondary | ICD-10-CM | POA: Diagnosis present

## 2016-10-27 DIAGNOSIS — T50902A Poisoning by unspecified drugs, medicaments and biological substances, intentional self-harm, initial encounter: Secondary | ICD-10-CM | POA: Insufficient documentation

## 2016-10-27 DIAGNOSIS — Z79899 Other long term (current) drug therapy: Secondary | ICD-10-CM

## 2016-10-27 DIAGNOSIS — R45851 Suicidal ideations: Secondary | ICD-10-CM

## 2016-10-27 DIAGNOSIS — T404X2A Poisoning by other synthetic narcotics, intentional self-harm, initial encounter: Secondary | ICD-10-CM | POA: Diagnosis not present

## 2016-10-27 LAB — COMPREHENSIVE METABOLIC PANEL
ALT: 23 U/L (ref 14–54)
AST: 25 U/L (ref 15–41)
Albumin: 3.2 g/dL — ABNORMAL LOW (ref 3.5–5.0)
Alkaline Phosphatase: 89 U/L (ref 38–126)
Anion gap: 10 (ref 5–15)
BUN: 11 mg/dL (ref 6–20)
CO2: 21 mmol/L — ABNORMAL LOW (ref 22–32)
Calcium: 8.7 mg/dL — ABNORMAL LOW (ref 8.9–10.3)
Chloride: 98 mmol/L — ABNORMAL LOW (ref 101–111)
Creatinine, Ser: 0.62 mg/dL (ref 0.44–1.00)
GFR calc Af Amer: >60 mL/min (ref >60)
GFR calc non Af Amer: >60 mL/min (ref >60)
Glucose, Bld: 324 mg/dL — ABNORMAL HIGH (ref 65–99)
Potassium: 3.9 mmol/L (ref 3.5–5.1)
Sodium: 129 mmol/L — ABNORMAL LOW (ref 135–145)
Total Bilirubin: 0.4 mg/dL (ref 0.3–1.2)
Total Protein: 5.8 g/dL — ABNORMAL LOW (ref 6.5–8.1)

## 2016-10-27 LAB — GLUCOSE, CAPILLARY
GLUCOSE-CAPILLARY: 318 mg/dL — AB (ref 65–99)
GLUCOSE-CAPILLARY: 229 mg/dL — AB (ref 65–99)

## 2016-10-27 LAB — CBG MONITORING, ED
Glucose-Capillary: 290 mg/dL — ABNORMAL HIGH (ref 65–99)
Glucose-Capillary: 295 mg/dL — ABNORMAL HIGH (ref 65–99)

## 2016-10-27 MED ORDER — PNEUMOCOCCAL VAC POLYVALENT 25 MCG/0.5ML IJ INJ
0.5000 mL | INJECTION | INTRAMUSCULAR | Status: AC
  Administered 2016-10-28: 0.5 mL via INTRAMUSCULAR

## 2016-10-27 MED ORDER — PANTOPRAZOLE SODIUM 40 MG PO TBEC
40.0000 mg | DELAYED_RELEASE_TABLET | Freq: Every day | ORAL | Status: DC
  Administered 2016-10-28 – 2016-11-05 (×9): 40 mg via ORAL
  Filled 2016-10-27 (×12): qty 1

## 2016-10-27 MED ORDER — ONDANSETRON HCL 4 MG PO TABS: 4.0000 mg | ORAL_TABLET | Freq: Three times a day (TID) | ORAL | Status: DC | PRN

## 2016-10-27 MED ORDER — LORAZEPAM 0.5 MG PO TABS
0.5000 mg | ORAL_TABLET | Freq: Three times a day (TID) | ORAL | Status: DC | PRN
  Administered 2016-10-27: 0.5 mg via ORAL
  Filled 2016-10-27: qty 1

## 2016-10-27 MED ORDER — ACETAMINOPHEN 325 MG PO TABS
650.0000 mg | ORAL_TABLET | Freq: Four times a day (QID) | ORAL | Status: DC | PRN
  Administered 2016-10-30: 650 mg via ORAL
  Filled 2016-10-27: qty 2

## 2016-10-27 MED ORDER — CITALOPRAM HYDROBROMIDE 20 MG PO TABS
20.0000 mg | ORAL_TABLET | Freq: Every day | ORAL | Status: DC
  Administered 2016-10-28: 20 mg via ORAL
  Filled 2016-10-27 (×4): qty 1

## 2016-10-27 MED ORDER — INSULIN ASPART PROT & ASPART (70-30 MIX) 100 UNIT/ML ~~LOC~~ SUSP
30.0000 [IU] | Freq: Two times a day (BID) | SUBCUTANEOUS | Status: DC
  Administered 2016-10-27: 30 [IU] via SUBCUTANEOUS
  Filled 2016-10-27: qty 10

## 2016-10-27 MED ORDER — MIRTAZAPINE 7.5 MG PO TABS
7.5000 mg | ORAL_TABLET | Freq: Every day | ORAL | Status: DC
  Administered 2016-10-27 – 2016-10-30 (×4): 7.5 mg via ORAL
  Filled 2016-10-27 (×7): qty 1

## 2016-10-27 MED ORDER — MAGNESIUM HYDROXIDE 400 MG/5ML PO SUSP: 30.0000 mL | Freq: Every day | ORAL | Status: DC | PRN

## 2016-10-27 MED ORDER — TRAZODONE HCL 150 MG PO TABS: 150.0000 mg | ORAL_TABLET | Freq: Every day | ORAL | Status: DC

## 2016-10-27 MED ORDER — NICOTINE 21 MG/24HR TD PT24
21.0000 mg | MEDICATED_PATCH | Freq: Every day | TRANSDERMAL | Status: DC
  Administered 2016-10-28 – 2016-11-04 (×8): 21 mg via TRANSDERMAL
  Filled 2016-10-27 (×13): qty 1

## 2016-10-27 MED ORDER — METFORMIN HCL 500 MG PO TABS
500.0000 mg | ORAL_TABLET | Freq: Two times a day (BID) | ORAL | Status: DC
  Administered 2016-10-27 – 2016-10-30 (×6): 500 mg via ORAL
  Filled 2016-10-27 (×12): qty 1

## 2016-10-27 MED ORDER — ALUM & MAG HYDROXIDE-SIMETH 200-200-20 MG/5ML PO SUSP: 30.0000 mL | ORAL | Status: DC | PRN

## 2016-10-27 MED ORDER — LORAZEPAM 0.5 MG PO TABS
0.5000 mg | ORAL_TABLET | Freq: Three times a day (TID) | ORAL | Status: DC | PRN
  Administered 2016-10-27 – 2016-10-31 (×8): 0.5 mg via ORAL
  Filled 2016-10-27 (×9): qty 1

## 2016-10-27 MED ORDER — SULFAMETHOXAZOLE-TRIMETHOPRIM 800-160 MG PO TABS
1.0000 | ORAL_TABLET | Freq: Two times a day (BID) | ORAL | Status: DC
  Administered 2016-10-27: 1 via ORAL
  Filled 2016-10-27 (×7): qty 1

## 2016-10-27 MED ORDER — GABAPENTIN 300 MG PO CAPS
300.0000 mg | ORAL_CAPSULE | Freq: Three times a day (TID) | ORAL | Status: DC
  Administered 2016-10-27 – 2016-11-01 (×16): 300 mg via ORAL
  Filled 2016-10-27 (×22): qty 1

## 2016-10-27 MED ORDER — INFLUENZA VAC SPLIT QUAD 0.5 ML IM SUSY
0.5000 mL | PREFILLED_SYRINGE | INTRAMUSCULAR | Status: AC
  Administered 2016-10-28: 0.5 mL via INTRAMUSCULAR
  Filled 2016-10-27: qty 0.5

## 2016-10-27 MED ORDER — INSULIN ASPART PROT & ASPART (70-30 MIX) 100 UNIT/ML ~~LOC~~ SUSP
30.0000 [IU] | Freq: Two times a day (BID) | SUBCUTANEOUS | Status: DC
  Administered 2016-10-27 – 2016-10-28 (×3): 30 [IU] via SUBCUTANEOUS

## 2016-10-27 NOTE — Progress Notes (Signed)
Inpatient Diabetes Program Recommendations  AACE/ADA: New Consensus Statement on Inpatient Glycemic Control (2015)  Target Ranges:  Prepandial:   less than 140 mg/dL      Peak postprandial:   less than 180 mg/dL (1-2 hours)      Critically ill patients:  140 - 180 mg/dL   Lab Results  Component Value Date   GLUCAP 290 (H) 10/27/2016   HGBA1C 11.3 (H) 12/06/2013    Review of Glycemic Control  Diabetes history: DM2 Outpatient Diabetes medications: metformin 500 mg bid (not taking) Current orders for Inpatient glycemic control: 70/30 30 units bid, metformin 500 mg bid  Needs rapid-acting insulin. Last HgbA1C in 2015 - 11.3%.  Inpatient Diabetes Program Recommendations:    Add Novolog moderate tidwc and hs Check HgbA1C to assess glycemic control prior to hospitalization  Will follow. Thank you. Claudia Erickson, RD, LDN, CDE Inpatient Diabetes Coordinator [phone]

## 2016-10-27 NOTE — H&P (Signed)
[facility]oline Beaud[facility] [place] notified for transport.

## 2016-10-27 NOTE — Consult Note (Signed)
[facility] Face-to-Face Psychiatry Consult   Reason for Consult:  Worsening Depression; suicide attempt Referring Physician:  EDP Patient Identification: Claudia Erickson MRN:  [id number] Principal Diagnosis: MDD (major depressive disorder), recurrent severe, without psychosis ([place]) Diagnosis:   Patient Active Problem List   Diagnosis Date Noted  . Urinary frequency [R35.0] 04/08/2015  . Anxiety and depression [F41.8] 12/26/2014  . Insomnia [G47.00] 12/26/2014  . MDD (major depressive disorder), recurrent severe, without psychosis ([place]) [F33.2] 08/27/2014  . Depressive disorder [F32.9] 07/28/2014  . Aspiration into airway [T17.908A] 12/06/2013  . Aspiration pneumonia ([place]) [J69.0] 12/06/2013  . Polysubstance abuse [F19.10] 12/06/2013  . Diabetes mellitus ([place]) [E11.9] 12/06/2013  . Syncope [R55] 12/06/2013  . Leucocytosis [D72.829] 12/06/2013  . Acute respiratory failure ([place]) [J96.00] 12/06/2013  . Elevated LFTs [R79.89] 12/06/2013    Total Time spent with patient: 45 minutes  Subjective:   Claudia Erickson is a 51 y.o. female patient present to [facility] with complaints of worsening depression and intentional overdose  HPI:  Claudia Erickson 51 y.o. female patient seen by Dr. Darleene Cleaver and this provider.  Chart reviewed 10/27/16.   On evaluation:  Claudia Erickson reports that she suffers from chronic depression and that for the last several weeks it has been worsening "I don't know why I'm depress.  It has been going on for 20 years." Patient also admits to cocaine use regularly.  When asked about amphetamines denies stating that the cocaine she used must have had it in it.  Patient states that she has been on multiple psychotropics "and I guess most of them would work if I took them."  States today she feels shaky "like I want to jump out of my skin."  Patient continues to endorse suicidal ideation and worsening depression.  Denies psychosis, and paranoia   Past Psychiatric History: Prior  inpatient hospitalization; Major Depression, Anxiety.  Out[facility] provider [facility] last seen September 2017  Risk to Self: Suicidal Ideation: Yes-Currently Present Suicidal Intent: Yes-Currently Present Is patient at risk for suicide?: Yes Suicidal Plan?: Yes-Currently Present Specify Current Suicidal Plan: overdosed on trazadone this AM Access to Means: Yes Specify Access to Suicidal Means: access to pills What has been your use of drugs/alcohol within the last 12 months?: hx of crack addiction How many times?: 3 Other Self Harm Risks: none Triggers for Past Attempts: Unpredictable Intentional Self Injurious Behavior: None Risk to Others: Homicidal Ideation: No Thoughts of Harm to Others: No Current Homicidal Intent: No Current Homicidal Plan: No Access to Homicidal Means: No Identified Victim: None History of harm to others?: No Assessment of Violence: None Noted Violent Behavior Description: None Does patient have access to weapons?: No Criminal Charges Pending?: No Does patient have a court date: No Prior Inpatient Therapy: Prior Inpatient Therapy: Yes Prior Therapy Dates: unknown Prior Therapy Facilty/Provider(s): [facility], [facility]  Reason for Treatment: Depression SI, SA Prior Outpatient Therapy: Prior Outpatient Therapy: Yes Prior Therapy Dates: stopped going September 2017 Prior Therapy Facilty/Provider(s): [facility] Reason for Treatment: SA, Depression Does patient have an ACCT team?: No Does patient have Intensive In-House Services?  : No Does patient have [facility] services? : No Does patient have P4CC services?: No  Past Medical History:  Past Medical History:  Diagnosis Date  . Anxiety and depression   . Chronic back pain   . Diabetes mellitus without complication ([place])   . GERD (gastroesophageal reflux disease)   . Hypertension   . Panic attack   . Peripheral  neuropathy ([place])   . Positive TB test   . Substance abuse   . Substance induced  mood disorder ([place])   . UTI (lower urinary tract infection)     Past Surgical History:  Procedure Laterality Date  . ABDOMINAL HYSTERECTOMY    . CHOLECYSTECTOMY    . ROTATOR CUFF REPAIR    . TONSILLECTOMY     Family History:  Family History  Problem Relation Age of Onset  . Diabetes Mellitus II Father   . Colon cancer Father   . Hyperlipidemia Father   . Diabetes Father   . Arthritis Mother   . Hypertension Mother   . CAD Neg Hx    Family Psychiatric  History: Denies Social History:  History  Alcohol Use No     History  Drug Use  . Types: Cocaine    Comment: Heroine    Social History   Social History  . Marital status: Single    Spouse name: N/A  . Number of children: 2  . 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 Smoking status: Current Every Day Smoker    Packs/day: 0.50    Years: 30.00    Types: Cigarettes  . Smokeless tobacco: Never Used  . Alcohol use No  . Drug use: Yes    Types: Cocaine     Comment: Heroine  . Sexual activity: Not Asked   Other Topics Concern  . None   Social History Narrative   Born and raised in [place], [place]. Fun: Doesn't do a thing - never leaves the house.   Denies religious beliefs effecting health care.    Additional Social History:    Allergies:  No Known Allergies  Labs:  Results for orders placed or performed during the hospital encounter of 10/26/16 (from the past 48 hour(s))  Ethanol     Status: None   Collection Time: 10/26/16 12:29 PM  Result Value Ref Range   Alcohol, Ethyl (B) <5 <5 mg/dL    Comment:        LOWEST DETECTABLE LIMIT FOR SERUM ALCOHOL IS 5 mg/dL FOR MEDICAL PURPOSES ONLY   Salicylate level     Status: None   Collection Time: 10/26/16 12:29 PM  Result Value Ref Range   Salicylate Lvl <6.6 2.8 - 30.0 mg/dL  Acetaminophen level     Status: Abnormal   Collection Time: 10/26/16 12:29 PM  Result Value Ref Range   Acetaminophen  (Tylenol), Serum <10 (L) 10 - 30 ug/mL    Comment:        THERAPEUTIC CONCENTRATIONS VARY SIGNIFICANTLY. A RANGE OF 10-30 ug/mL MAY BE AN EFFECTIVE CONCENTRATION FOR MANY PATIENTS. HOWEVER, SOME ARE BEST TREATED AT CONCENTRATIONS OUTSIDE THIS RANGE. ACETAMINOPHEN CONCENTRATIONS >150 ug/mL AT 4 HOURS AFTER INGESTION AND >50 ug/mL AT 12 HOURS AFTER INGESTION ARE OFTEN ASSOCIATED WITH TOXIC REACTIONS.   CBC with Differential/Platelet     Status: Abnormal   Collection Time: 10/26/16 12:29 PM  Result Value Ref Range   WBC 11.3 (H) 4.0 - 10.5 K/uL   RBC 5.29 (H) 3.87 - 5.11 MIL/uL   Hemoglobin 15.5 (H) 12.0 - 15.0 g/dL   HCT 44.5 36.0 - 46.0 %   MCV 84.1 78.0 - 100.0 fL   MCH 29.3 26.0 - 34.0 pg   MCHC 34.8 30.0 - 36.0 g/dL   RDW 12.7 11.5 - 15.5 %   Platelets 207 150 - 400 K/uL  Neutrophils Relative % 74 %   Neutro Abs 8.3 (H) 1.7 - 7.7 K/uL   Lymphocytes Relative 20 %   Lymphs Abs 2.3 0.7 - 4.0 K/uL   Monocytes Relative 6 %   Monocytes Absolute 0.7 0.1 - 1.0 K/uL   Eosinophils Relative 0 %   Eosinophils Absolute 0.1 0.0 - 0.7 K/uL   Basophils Relative 0 %   Basophils Absolute 0.0 0.0 - 0.1 K/uL  Comprehensive metabolic panel     Status: Abnormal   Collection Time: 10/26/16 12:29 PM  Result Value Ref Range   Sodium 128 (L) 135 - 145 mmol/L   Potassium 5.0 3.5 - 5.1 mmol/L   Chloride 97 (L) 101 - 111 mmol/L   CO2 21 (L) 22 - 32 mmol/L   Glucose, Bld 397 (H) 65 - 99 mg/dL   BUN 12 6 - 20 mg/dL   Creatinine, Ser 0.63 0.44 - 1.00 mg/dL   Calcium 8.3 (L) 8.9 - 10.3 mg/dL   Total Protein 6.4 (L) 6.5 - 8.1 g/dL   Albumin 3.5 3.5 - 5.0 g/dL   AST 38 15 - 41 U/L   ALT 21 14 - 54 U/L   Alkaline Phosphatase 117 38 - 126 U/L   Total Bilirubin 1.4 (H) 0.3 - 1.2 mg/dL   GFR calc non Af Amer >60 >60 mL/min   GFR calc Af Amer >60 >60 mL/min    Comment: (NOTE) The eGFR has been calculated using the CKD EPI equation. This calculation has not been validated in all clinical  situations. eGFR's persistently <60 mL/min signify possible Chronic Kidney Disease.    Anion gap 10 5 - 15  Rapid urine drug screen (hospital performed)     Status: Abnormal   Collection Time: 10/26/16 12:32 PM  Result Value Ref Range   Opiates NONE DETECTED NONE DETECTED   Cocaine POSITIVE (A) NONE DETECTED   Benzodiazepines NONE DETECTED NONE DETECTED   Amphetamines POSITIVE (A) NONE DETECTED   Tetrahydrocannabinol NONE DETECTED NONE DETECTED   Barbiturates NONE DETECTED NONE DETECTED    Comment:        DRUG SCREEN FOR MEDICAL PURPOSES ONLY.  IF CONFIRMATION IS NEEDED FOR ANY PURPOSE, NOTIFY LAB WITHIN 5 DAYS.        LOWEST DETECTABLE LIMITS FOR URINE DRUG SCREEN Drug Class       Cutoff (ng/mL) Amphetamine      1000 Barbiturate      200 Benzodiazepine   568 Tricyclics       127 Opiates          300 Cocaine          300 THC              50   POC CBG, ED     Status: Abnormal   Collection Time: 10/26/16  3:02 PM  Result Value Ref Range   Glucose-Capillary 319 (H) 65 - 99 mg/dL   Comment 1 Notify RN   Acetaminophen level     Status: Abnormal   Collection Time: 10/26/16  5:40 PM  Result Value Ref Range   Acetaminophen (Tylenol), Serum <10 (L) 10 - 30 ug/mL    Comment:        THERAPEUTIC CONCENTRATIONS VARY SIGNIFICANTLY. A RANGE OF 10-30 ug/mL MAY BE AN EFFECTIVE CONCENTRATION FOR MANY PATIENTS. HOWEVER, SOME ARE BEST TREATED AT CONCENTRATIONS OUTSIDE THIS RANGE. ACETAMINOPHEN CONCENTRATIONS >150 ug/mL AT 4 HOURS AFTER INGESTION AND >50 ug/mL AT 12 HOURS AFTER INGESTION ARE OFTEN ASSOCIATED WITH  TOXIC REACTIONS.   CBG monitoring, ED     Status: Abnormal   Collection Time: 10/26/16  8:17 PM  Result Value Ref Range   Glucose-Capillary 279 (H) 65 - 99 mg/dL   Comment 1 Notify RN    Comment 2 Document in Chart   CBG monitoring, ED     Status: Abnormal   Collection Time: 10/27/16  8:00 AM  Result Value Ref Range   Glucose-Capillary 290 (H) 65 - 99 mg/dL   Comprehensive metabolic panel     Status: Abnormal   Collection Time: 10/27/16 11:40 AM  Result Value Ref Range   Sodium 129 (L) 135 - 145 mmol/L   Potassium 3.9 3.5 - 5.1 mmol/L    Comment: DELTA CHECK NOTED   Chloride 98 (L) 101 - 111 mmol/L   CO2 21 (L) 22 - 32 mmol/L   Glucose, Bld 324 (H) 65 - 99 mg/dL   BUN 11 6 - 20 mg/dL   Creatinine, Ser 0.62 0.44 - 1.00 mg/dL   Calcium 8.7 (L) 8.9 - 10.3 mg/dL   Total Protein 5.8 (L) 6.5 - 8.1 g/dL   Albumin 3.2 (L) 3.5 - 5.0 g/dL   AST 25 15 - 41 U/L   ALT 23 14 - 54 U/L   Alkaline Phosphatase 89 38 - 126 U/L   Total Bilirubin 0.4 0.3 - 1.2 mg/dL   GFR calc non Af Amer >60 >60 mL/min   GFR calc Af Amer >60 >60 mL/min    Comment: (NOTE) The eGFR has been calculated using the CKD EPI equation. This calculation has not been validated in all clinical situations. eGFR's persistently <60 mL/min signify possible Chronic Kidney Disease.    Anion gap 10 5 - 15  CBG monitoring, ED     Status: Abnormal   Collection Time: 10/27/16 12:12 PM  Result Value Ref Range   Glucose-Capillary 295 (H) 65 - 99 mg/dL    Current Facility-Administered Medications  Medication Dose Route Frequency Provider Last Rate Last Dose  . citalopram (CELEXA) tablet 20 mg  20 mg Oral Daily Delos Haring, PA-C   20 mg at 10/27/16 0941  . gabapentin (NEURONTIN) capsule 300 mg  300 mg Oral TID Delos Haring, PA-C   300 mg at 10/27/16 0941  . insulin aspart protamine- aspart (NOVOLOG MIX 70/30) injection 30 Units  30 Units Subcutaneous BID WC Virgel Manifold, MD   30 Units at 10/27/16 [phone]  . LORazepam (ATIVAN) tablet 0.5 mg  0.5 mg Oral Q8H PRN Virgel Manifold, MD   0.5 mg at 10/27/16 1032  . metFORMIN (GLUCOPHAGE) tablet 500 mg  500 mg Oral BID WC Delos Haring, PA-C   500 mg at 10/27/16 0805  . nicotine (NICODERM CQ - dosed in mg/24 hours) patch 21 mg  21 mg Transdermal Daily Delos Haring, PA-C   21 mg at 10/27/16 2694  . omeprazole (PRILOSEC) capsule 20 mg  20 mg Oral  Daily Delos Haring, PA-C   20 mg at 10/27/16 0940  . ondansetron (ZOFRAN) tablet 4 mg  4 mg Oral Q8H PRN Delos Haring, PA-C      . sulfamethoxazole-trimethoprim (BACTRIM DS,SEPTRA DS) 800-160 MG per tablet 1 tablet  1 tablet Oral BID Delos Haring, PA-C   1 tablet at 10/26/16 1615  . traZODone (DESYREL) tablet 150 mg  150 mg Oral QHS Delos Haring, PA-C   150 mg at 10/26/16 2141   Current Outpatient Prescriptions  Medication Sig Dispense Refill  . gabapentin (NEURONTIN) 300 MG capsule  Take 1 capsule (300 mg total) by mouth 3 (three) times daily. (Patient taking differently: Take 600 mg by mouth 3 (three) times daily. ) 90 capsule 3  . ibuprofen (ADVIL,MOTRIN) 200 MG tablet Take 800 mg by mouth every 6 (six) hours as needed.    Marland Kitchen omeprazole (PRILOSEC OTC) 20 MG tablet Take 20 mg by mouth daily.    . traZODone (DESYREL) 100 MG tablet Take 150 mg by mouth at bedtime.    . citalopram (CELEXA) 20 MG tablet Take 1 tablet (20 mg total) by mouth daily. (Patient not taking: Reported on 10/26/2016) 30 tablet 11  . metFORMIN (GLUCOPHAGE) 500 MG tablet Take 1 tablet (500 mg total) by mouth 2 (two) times daily with a meal. (Patient not taking: Reported on 10/26/2016) 60 tablet 11  . sulfamethoxazole-trimethoprim (BACTRIM DS,SEPTRA DS) 800-160 MG per tablet Take 1 tablet by mouth 2 (two) times daily. (Patient not taking: Reported on 10/26/2016) 6 tablet 0    Musculoskeletal: Strength & Muscle Tone: within normal limits Gait & Station: normal Patient leans: N/A  Psychiatric Specialty Exam: Physical Exam  Nursing note and vitals reviewed. Constitutional: She is oriented to person, place, and time.  Neck: Normal range of motion.  Respiratory: Effort normal.  Musculoskeletal: Normal range of motion.  Neurological: She is alert and oriented to person, place, and time.    Review of Systems  Psychiatric/Behavioral: Positive for depression, substance abuse and suicidal ideas. Negative for hallucinations.  The patient is nervous/anxious.   All other systems reviewed and are negative.   Blood pressure 156/92, pulse 78, temperature 98 F (36.7 C), temperature source Oral, resp. rate 18, SpO2 94 %.There is no height or weight on file to calculate BMI.  General Appearance: Disheveled  Eye Contact:  Good  Speech:  Clear and Coherent and Normal Rate  Volume:  Normal  Mood:  Depressed  Affect:  Depressed and Flat  Thought Process:  Goal Directed  Orientation:  Full (Time, Place, and Person)  Thought Content:  Denies hallucinations, delusions, and paranoia  Suicidal Thoughts:  Yes.  with intent/plan  Homicidal Thoughts:  No  Memory:  Immediate;   Good Recent;   Good Remote;   Good  Judgement:  Poor  Insight:  Lacking and Shallow  Psychomotor Activity:  Normal  Concentration:  Concentration: Fair and Attention Span: Fair  Recall:  Good  Fund of Knowledge:  Fair  Language:  Good  Akathisia:  No  Handed:  Right  AIMS (if indicated):     Assets:  Communication Skills Desire for Improvement  ADL's:  Intact  Cognition:  WNL  Sleep:        Treatment Plan Summary: Daily contact with patient to assess and evaluate symptoms and progress in treatment and Medication management  Disposition: Recommend psychiatric Inpatient admission when medically cleared.   Patient has been accepted to [facility] [address] hall  Earleen Newport, NP 10/27/2016 12:27 PM  Patient seen face-to-face for psychiatric evaluation, chart reviewed and case discussed with the physician extender and developed treatment plan. Reviewed the information documented and agree with the treatment plan. Corena Pilgrim, MD

## 2016-10-27 NOTE — Tx Team (Signed)
Initial Treatment Plan 10/27/2016 4:33 PM Glori Bickersllison B Lemarr [id number]    PATIENT STRESSORS: Financial difficulties Health problems Marital or family conflict Medication change or noncompliance Occupational concerns Substance abuse Traumatic event   PATIENT STRENGTHS: Average or above average intelligence Communication skills   PATIENT IDENTIFIED PROBLEMS: Depression  Anxiety  "Caregive for 2 parents"  Death of mother  Conflict with father who is ill  Surveyor, quantityinancial Stressors  Substance abuse  Suicidal Ideation       DISCHARGE CRITERIA:  Ability to meet basic life and health needs Medical problems require only outpatient monitoring Motivation to continue treatment in a less acute level of care Reduction of life-threatening or endangering symptoms to within safe limits Verbal commitment to aftercare and medication compliance Withdrawal symptoms are absent or subacute and managed without 24-hour nursing intervention  PRELIMINARY DISCHARGE PLAN: Attend 12-step recovery group Outpatient therapy Return to previous living arrangement  PATIENT/FAMILY INVOLVEMENT: This treatment plan has been presented to and reviewed with the patient, Glori Bickersllison B Gaffey.  The patient and family have been given the opportunity to ask questions and make suggestions.  Cranford MonBeaudry, Maricela Kawahara Evans, RN 10/27/2016, 4:33 PM

## 2016-10-27 NOTE — Progress Notes (Signed)
Admission note:  Patient is a 51 yo female that presented to ED after an intentional overdose of trazodone.  Patient stated she took 38 tablets of trazodone.  Patient has been medically cleared.  Patient contracts for safety on the unit and denies self harm thoughts at this time.  She denies HI/AVH.  Patient reports no alcohol use.  She uses crack cocaine "periodically."  She states she has been using crack cocaine "a couple of time a month."  She states she has done this for 30 years.  [facility] abuse of crack.  She has not had any significant sobriety.  She is a smoker and has a nicotine patch on.  Patient has been taking care of her mother who recently died in September.  She states her mother had Alzheimers disease and her father has also been diagnosed.  Patient had been living in her mother's house and now has to pack it up and move in with her father.  She states she will taking care of her father now.  Patient has a sibling who she does not speak with.  She is unemployed and not on disability.  She states, "I need to be."  Patient has medical hx of diabetes and has not been taking her insulin because "I can't afford it."  Patient does not have a provider and has not been taking any psyche meds.  She has hx of chronic back pain, GERD, HTN, peripheral neuropathy.  Skin search revealed a couple of tatoos and she has significant swelling around her ankles.  Her sodium level is 128.  Patient's UDS was positive for cocaine and amphetamines.  She was oriented to room and unit.

## 2016-10-27 NOTE — [facility] Assessment (Signed)
[facility] Assessment Progress Note  Per Thedore MinsMojeed Akintayo, MD, this pt requires psychiatric hospitalization at this time.  Malva LimesLinsey Strader, RN, [id number]  Pt has signed Voluntary Admission and Consent for Treatment, as well as Consent to Release Information to no one, and signed [facility].  Pt's nurse has been notified, and agrees to send original paperwork along with pt via [place], and to call report to [phone].  Doylene Canninghomas Oluchi Pucci, MA Triage Specialist [phone]

## 2016-10-27 NOTE — ED Notes (Signed)
Patient transported to X-ray 

## 2016-10-27 NOTE — ED Notes (Signed)
DR. Juleen ChinaKOHUT MADE AWARE OF  PT'S CBG, AND CURRENT MEDICATIONS GIVEN.

## 2016-10-27 NOTE — Progress Notes (Signed)
D: Patient pleasant and cooperative with care this shift. Pt with complaints of anxiety and insomnia; prn meds effective. Pt states she needs assistance with being able to afford her insulin and also complains of increasing neuropathy in her lower extremities. Pt denies suicidal ideations currently. A: Encourage staff/peer interaction, medication compliance, and group participation. Administer medications as ordered, maintain Q 15 minute safety checks. R: Pt attended group session and is noted to interact with peers in the milieu. No distress noted.

## 2016-10-27 NOTE — Progress Notes (Signed)
[facility]

## 2016-10-28 DIAGNOSIS — T1491XA Suicide attempt, initial encounter: Secondary | ICD-10-CM

## 2016-10-28 DIAGNOSIS — Z794 Long term (current) use of insulin: Secondary | ICD-10-CM

## 2016-10-28 DIAGNOSIS — Z833 Family history of diabetes mellitus: Secondary | ICD-10-CM

## 2016-10-28 DIAGNOSIS — F332 Major depressive disorder, recurrent severe without psychotic features: Principal | ICD-10-CM

## 2016-10-28 DIAGNOSIS — Z8349 Family history of other endocrine, nutritional and metabolic diseases: Secondary | ICD-10-CM

## 2016-10-28 DIAGNOSIS — E119 Type 2 diabetes mellitus without complications: Secondary | ICD-10-CM

## 2016-10-28 DIAGNOSIS — Z8261 Family history of arthritis: Secondary | ICD-10-CM

## 2016-10-28 DIAGNOSIS — Z800 Family history of malignant neoplasm of digestive organs: Secondary | ICD-10-CM

## 2016-10-28 DIAGNOSIS — I872 Venous insufficiency (chronic) (peripheral): Secondary | ICD-10-CM | POA: Diagnosis present

## 2016-10-28 DIAGNOSIS — T43212A Poisoning by selective serotonin and norepinephrine reuptake inhibitors, intentional self-harm, initial encounter: Secondary | ICD-10-CM

## 2016-10-28 DIAGNOSIS — E871 Hypo-osmolality and hyponatremia: Secondary | ICD-10-CM | POA: Diagnosis present

## 2016-10-28 DIAGNOSIS — Z79899 Other long term (current) drug therapy: Secondary | ICD-10-CM

## 2016-10-28 LAB — GLUCOSE, CAPILLARY
GLUCOSE-CAPILLARY: 285 mg/dL — AB (ref 65–99)
Glucose-Capillary: 186 mg/dL — ABNORMAL HIGH (ref 65–99)
Glucose-Capillary: 332 mg/dL — ABNORMAL HIGH (ref 65–99)
Glucose-Capillary: 244 mg/dL — ABNORMAL HIGH (ref 65–99)

## 2016-10-28 MED ORDER — CITALOPRAM HYDROBROMIDE 10 MG PO TABS
10.0000 mg | ORAL_TABLET | Freq: Every day | ORAL | Status: DC
  Administered 2016-10-29 – 2016-11-01 (×4): 10 mg via ORAL
  Filled 2016-10-28 (×6): qty 1

## 2016-10-28 MED ORDER — INSULIN ASPART 100 UNIT/ML ~~LOC~~ SOLN
0.0000 [IU] | Freq: Every day | SUBCUTANEOUS | Status: DC
  Administered 2016-10-28: 2 [IU] via SUBCUTANEOUS

## 2016-10-28 MED ORDER — INSULIN ASPART PROT & ASPART (70-30 MIX) 100 UNIT/ML ~~LOC~~ SUSP
40.0000 [IU] | Freq: Every day | SUBCUTANEOUS | Status: DC
  Administered 2016-10-29 – 2016-11-05 (×8): 40 [IU] via SUBCUTANEOUS
  Filled 2016-10-28: qty 0.4

## 2016-10-28 MED ORDER — HYDROCORTISONE 0.5 % EX CREA
TOPICAL_CREAM | Freq: Three times a day (TID) | CUTANEOUS | Status: DC
  Administered 2016-10-28 – 2016-10-31 (×5): via TOPICAL
  Filled 2016-10-28 (×2): qty 28.35

## 2016-10-28 MED ORDER — INSULIN ASPART PROT & ASPART (70-30 MIX) 100 UNIT/ML ~~LOC~~ SUSP
30.0000 [IU] | Freq: Every day | SUBCUTANEOUS | Status: DC
  Administered 2016-10-29 – 2016-11-04 (×7): 30 [IU] via SUBCUTANEOUS

## 2016-10-28 MED ORDER — INSULIN ASPART 100 UNIT/ML ~~LOC~~ SOLN
0.0000 [IU] | Freq: Three times a day (TID) | SUBCUTANEOUS | Status: DC
  Administered 2016-10-29: 3 [IU] via SUBCUTANEOUS
  Administered 2016-10-29: 17:00:00 via SUBCUTANEOUS
  Administered 2016-10-30: 3 [IU] via SUBCUTANEOUS
  Administered 2016-10-30: 5 [IU] via SUBCUTANEOUS
  Administered 2016-10-30 – 2016-10-31 (×4): 3 [IU] via SUBCUTANEOUS
  Administered 2016-11-01: 2 [IU] via SUBCUTANEOUS
  Administered 2016-11-01: 3 [IU] via SUBCUTANEOUS
  Administered 2016-11-01: 5 [IU] via SUBCUTANEOUS
  Administered 2016-11-02: 2 [IU] via SUBCUTANEOUS
  Administered 2016-11-02: 5 [IU] via SUBCUTANEOUS
  Administered 2016-11-03: 3 [IU] via SUBCUTANEOUS
  Administered 2016-11-03 (×2): 2 [IU] via SUBCUTANEOUS
  Administered 2016-11-04: 8 [IU] via SUBCUTANEOUS

## 2016-10-28 NOTE — Progress Notes (Signed)
Adult Psychoeducational Group Note  Date:  10/28/2016 Time:  10:39 PM  Group Topic/Focus:  Wrap-Up Group:   The focus of this group is to help patients review their daily goal of treatment and discuss progress on daily workbooks.  Participation Level:  Active  Participation Quality:  Appropriate  Affect:  Appropriate  Cognitive:  Appropriate  Insight: Appropriate  Engagement in Group:  Engaged  Modes of Intervention:  Discussion  Additional Comments:  Patient attended group and said that her day was a 7.  Her goal for tomorrow is to talk with her case worker.   Brylan Dec W Sheralee Qazi 10/28/2016, 10:39 PM

## 2016-10-28 NOTE — Progress Notes (Signed)
D: Patient continues to report depressive symptoms.  She is sleeping well; her appetite is good; her energy level is low; her concentration is good.  She denies any thoughts of self harm.  She is attending groups and participating.  She rates her depression as a 6; hopelessness as a 4; anxiety as a 9.  Her goal is to "get resources for help/medications."  Patient is pleasant and is interacting well with staff and peers. A: Continue to monitor medication management and MD orders.  Safety checks completed every 15 minutes per protocol.  Offer support and encouragement as needed. R: Patient is receptive to staff; her behavior is appropriate.

## 2016-10-28 NOTE — [facility] Group Notes (Signed)
[facility] Group Notes:  Orientation Group  Date:  10/28/2016  Time:  11:24 AM  Type of Therapy:  Psychoeducational Skills  Participation Level:  Minimal  Participation Quality:  Appropriate  Affect:  Flat  Cognitive:  Appropriate  Insight:  Appropriate  Engagement in Group:  Engaged  Modes of Intervention:  Discussion and Education  Summary of Progress/Problems: Patient attended group and was engaged.   Marzetta BoardDopson, Januel Doolan E 10/28/2016, 11:24 AM

## 2016-10-28 NOTE — Progress Notes (Signed)
Adult Psychoeducational Group Note  Date:  10/28/2016 Time:  9:59 PM  Group Topic/Focus:  Wrap-Up Group:   The focus of this group is to help patients review their daily goal of treatment and discuss progress on daily workbooks.  Participation Level:  Active  Participation Quality:  Appropriate  Affect:  Appropriate  Cognitive:  Appropriate  Insight: Appropriate  Engagement in Group:  Engaged  Modes of Intervention:  Discussion  Additional Comments:  Patient attended group and said that her day was a 7.  Her goal for tomorrow is to have a  discussion with the social worker  Migel Hannis W Tashari Schoenfelder 10/28/2016, 9:59 PM

## 2016-10-28 NOTE — [facility] Group Notes (Signed)
[facility] 10/28/2016 1:15pm  Type of Therapy: Mental Health Association Presentation  Participation Level: Active  Participation Quality: Attentive  Affect: Appropriate  Cognitive: Oriented  Insight: Developing/Improving  Engagement in Therapy: Engaged  Modes of Intervention: Discussion, Education and Socialization  Summary of Progress/Problems: Mental Health Association (MHA) Speaker came to talk about his personal journey with substance abuse and addiction. The pt processed ways by which to relate to the speaker. MHA speaker provided handouts and educational information pertaining to groups and services offered by the [facility]. Pt was engaged in speaker's presentation and was receptive to resources provided.    Vernie ShanksLauren Cressida Milford, LCSW 10/28/2016 1:16 PM
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
## 2016-10-28 NOTE — Tx Team (Signed)
Interdisciplinary Treatment and Diagnostic Plan Update  10/28/2016 Time of Session: 2:50 PM  Claudia Erickson MRN: [id number]  Principal Diagnosis: MDD, no psychotic features   Secondary Diagnoses: Active Problems:   MDD (major depressive disorder), recurrent severe, without psychosis ([place])   Current Medications:  Current Facility-Administered Medications  Medication Dose Route Frequency Provider Last Rate Last Dose  . acetaminophen (TYLENOL) tablet 650 mg  650 mg Oral Q6H PRN Shuvon B Rankin, NP      . alum & mag hydroxide-simeth (MAALOX/MYLANTA) 200-200-20 MG/5ML suspension 30 mL  30 mL Oral Q4H PRN Shuvon B Rankin, NP      . Derrill Memo ON 10/29/2016] citalopram (CELEXA) tablet 10 mg  10 mg Oral Daily Fernando A Cobos, MD      . gabapentin (NEURONTIN) capsule 300 mg  300 mg Oral TID Shuvon B Rankin, NP   300 mg at 10/28/16 1141  . insulin aspart protamine- aspart (NOVOLOG MIX 70/30) injection 30 Units  30 Units Subcutaneous BID WC Shuvon B Rankin, NP   30 Units at 10/28/16 0806  . LORazepam (ATIVAN) tablet 0.5 mg  0.5 mg Oral Q8H PRN Shuvon B Rankin, NP   0.5 mg at 10/28/16 0803  . magnesium hydroxide (MILK OF MAGNESIA) suspension 30 mL  30 mL Oral Daily PRN Shuvon B Rankin, NP      . metFORMIN (GLUCOPHAGE) tablet 500 mg  500 mg Oral BID WC Shuvon B Rankin, NP   500 mg at 10/28/16 0802  . mirtazapine (REMERON) tablet 7.5 mg  7.5 mg Oral QHS Encarnacion Slates, NP   7.5 mg at 10/27/16 2121  . nicotine (NICODERM CQ - dosed in mg/24 hours) patch 21 mg  21 mg Transdermal Daily Shuvon B Rankin, NP   21 mg at 10/28/16 0806  . ondansetron (ZOFRAN) tablet 4 mg  4 mg Oral Q8H PRN Shuvon B Rankin, NP      . pantoprazole (PROTONIX) EC tablet 40 mg  40 mg Oral Daily Shuvon B Rankin, NP   40 mg at 10/28/16 0802    PTA Medications: Prescriptions Prior to Admission  Medication Sig Dispense Refill Last Dose  . gabapentin (NEURONTIN) 300 MG capsule Take 1 capsule (300 mg total) by mouth 3 (three) times daily.  (Patient taking differently: Take 600 mg by mouth 3 (three) times daily. ) 90 capsule 3 10/25/2016 at Unknown time  . ibuprofen (ADVIL,MOTRIN) 200 MG tablet Take 800 mg by mouth every 6 (six) hours as needed.   10/25/2016 at Unknown time  . omeprazole (PRILOSEC OTC) 20 MG tablet Take 20 mg by mouth daily.   10/25/2016 at Unknown time  . traZODone (DESYREL) 100 MG tablet Take 150 mg by mouth at bedtime.   10/25/2016 at Unknown time  . citalopram (CELEXA) 20 MG tablet Take 1 tablet (20 mg total) by mouth daily. (Patient not taking: Reported on 10/26/2016) 30 tablet 11 Not Taking at Unknown time  . metFORMIN (GLUCOPHAGE) 500 MG tablet Take 1 tablet (500 mg total) by mouth 2 (two) times daily with a meal. (Patient not taking: Reported on 10/26/2016) 60 tablet 11 Not Taking at Unknown time  . sulfamethoxazole-trimethoprim (BACTRIM DS,SEPTRA DS) 800-160 MG per tablet Take 1 tablet by mouth 2 (two) times daily. (Patient not taking: Reported on 10/26/2016) 6 tablet 0 Not Taking at Unknown time    Treatment Modalities: Medication Management, Group therapy, Case management,  1 to 1 session with clinician, Psychoeducation, Recreational therapy.  Patient Stressors: Financial difficulties Health problems Marital  or family conflict Medication change or noncompliance Occupational concerns Substance abuse Traumatic event  Patient Strengths: Average or above average intelligence Communication skills  Physician Treatment Plan for Primary Diagnosis: MDD, no psychotic features  Long Term Goal(s): Improvement in symptoms so as ready for discharge  Short Term Goals: Ability to verbalize feelings will improve Ability to disclose and discuss suicidal ideas Ability to demonstrate self-control will improve Ability to identify and develop effective coping behaviors will improve Ability to maintain clinical measurements within normal limits will improve Ability to identify triggers associated with substance abuse/mental  health issues will improve  Medication Management: Evaluate patient's response, side effects, and tolerance of medication regimen.  Therapeutic Interventions: 1 to 1 sessions, Unit Group sessions and Medication administration.  Evaluation of Outcomes: Not Met  Physician Treatment Plan for Secondary Diagnosis: Active Problems:   MDD (major depressive disorder), recurrent severe, without psychosis ([place])   Long Term Goal(s): Improvement in symptoms so as ready for discharge  Short Term Goals: Ability to verbalize feelings will improve Ability to disclose and discuss suicidal ideas Ability to demonstrate self-control will improve Ability to identify and develop effective coping behaviors will improve Ability to maintain clinical measurements within normal limits will improve Ability to identify triggers associated with substance abuse/mental health issues will improve  Medication Management: Evaluate patient's response, side effects, and tolerance of medication regimen.  Therapeutic Interventions: 1 to 1 sessions, Unit Group sessions and Medication administration.  Evaluation of Outcomes: Not Met   RN Treatment Plan for Primary Diagnosis: MDD, no psychotic features  Long Term Goal(s): Knowledge of disease and therapeutic regimen to maintain health will improve  Short Term Goals: Ability to verbalize feelings will improve, Ability to disclose and discuss suicidal ideas and Ability to identify and develop effective coping behaviors will improve  Medication Management: RN will administer medications as ordered by provider, will assess and evaluate patient's response and provide education to patient for prescribed medication. RN will report any adverse and/or side effects to prescribing provider.  Therapeutic Interventions: 1 on 1 counseling sessions, Psychoeducation, Medication administration, Evaluate responses to treatment, Monitor vital signs and CBGs as ordered, Perform/monitor CIWA,  COWS, AIMS and Fall Risk screenings as ordered, Perform wound care treatments as ordered.  Evaluation of Outcomes: Not Met   LCSW Treatment Plan for Primary Diagnosis: MDD, no psychotic features  Long Term Goal(s): Safe transition to appropriate next level of care at discharge, Engage patient in therapeutic group addressing interpersonal concerns.  Short Term Goals: Engage patient in aftercare planning with referrals and resources, Identify triggers associated with mental health/substance abuse issues and Increase skills for wellness and recovery  Therapeutic Interventions: Assess for all discharge needs, 1 to 1 time with Social worker, Explore available resources and support systems, Assess for adequacy in community support network, Educate family and significant other(s) on suicide prevention, Complete Psychosocial Assessment, Interpersonal group therapy.  Evaluation of Outcomes: Not Met   Progress in Treatment: Attending groups: Pt is new to milieu, continuing to assess  Participating in groups: Pt is new to milieu, continuing to assess  Taking medication as prescribed: Yes, MD continues to assess for medication changes as needed Toleration medication: Yes, no side effects reported at this time Family/Significant other contact made: No, Pt declines Patient understands diagnosis: Continuing to assess Discussing patient identified problems/goals with staff: Yes Medical problems stabilized or resolved: Yes Denies suicidal/homicidal ideation: Yes Issues/concerns per patient self-inventory: None Other: N/A  New problem(s) identified: None identified at this time.  New Short Term/Long Term Goal(s): None identified at this time.   Discharge Plan or Barriers: Pt will return home and follow-up with outpatient services.   Reason for Continuation of Hospitalization: Anxiety Depression Medication stabilization Suicidal ideation  Estimated Length of Stay: 3-5  days  Attendees: Patient: 10/28/2016  2:50 PM  Physician: Dr. Parke Poisson 10/28/2016  2:50 PM  Nursing: Mayra Neer, RN; Gaylan Gerold, RN 10/28/2016  2:50 PM  RN Care Manager: Lars Pinks, RN 10/28/2016  2:50 PM  Social Worker: Adriana Reams, LCSW; [place], LCSW 10/28/2016  2:50 PM  Recreational Therapist:  10/28/2016  2:50 PM  Other: Lindell Spar, NP; Samuel Jester, NP 10/28/2016  2:50 PM  Other:  10/28/2016  2:50 PM  Other: 10/28/2016  2:50 PM    Scribe for Treatment Team: Gladstone Lighter, LCSW 10/28/2016 2:50 PM
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
## 2016-10-28 NOTE — [facility] Suicide Risk Assessment (Signed)
[facility]   Nursing information obtained from:   chart, patient Demographic factors:   51 year old female, divorced, unemployed  Current Mental Status:   as below  Loss Factors:   mother passed away last year, unemployment, tense relationship with father Historical Factors:   depression,  Cocaine abuse  Risk Reduction Factors:   resilience   Total Time spent with patient: 45 minutes Principal Problem:  MDD, without Psychotic Features  Diagnosis:   Patient Active Problem List   Diagnosis Date Noted  . Hyperglycemia [R73.9]   . Intentional drug overdose (HCC) [T50.902A]   . Suicidal ideation [R45.851]   . Urinary frequency [R35.0] 04/08/2015  . Anxiety and depression [F41.8] 12/26/2014  . Insomnia [G47.00] 12/26/2014  . MDD (major depressive disorder), recurrent severe, without psychosis (HCC) [F33.2] 08/27/2014  . Depressive disorder [F32.9] 07/28/2014  . Aspiration into airway [T17.908A] 12/06/2013  . Aspiration pneumonia (HCC) [J69.0] 12/06/2013  . Polysubstance abuse [F19.10] 12/06/2013  . Diabetes mellitus (HCC) [E11.9] 12/06/2013  . Syncope [R55] 12/06/2013  . Leucocytosis [D72.829] 12/06/2013  . Acute respiratory failure (HCC) [J96.00] 12/06/2013  . Elevated LFTs [R79.89] 12/06/2013    Continued Clinical Symptoms:  Alcohol Use Disorder Identification Test Final Score (AUDIT): 0 The "Alcohol Use Disorders Identification Test", Guidelines for Use in Primary Care, Second Edition.  World Science writerHealth Organization [facility](WHO). Score between 0-7:  no or low risk or alcohol related problems. Score between 8-15:  moderate risk of alcohol related problems. Score between 16-19:  high risk of alcohol related problems. Score 20 or above:  warrants further diagnostic evaluation for alcohol dependence and treatment.   CLINICAL FACTORS:  51 year old female, presents for chronic but worsening depression, recent suicidal attempt by overdosing on Trazodone . Facing  significant stressors, to include unemployment, being financially dependent on father, and death of mother last year.  Psychiatric Specialty Exam: Physical Exam  ROS  Blood pressure (!) 112/94, pulse (!) 105, temperature 98.4 F (36.9 C), temperature source Oral, resp. rate 18, height 5' 8.25" (1.734 m), weight 133.8 kg (295 lb).Body mass index is 44.53 kg/m.   see admit note MSE    COGNITIVE FEATURES THAT CONTRIBUTE TO RISK:  Closed-mindedness and Loss of executive function    SUICIDE RISK:   Moderate:  Frequent suicidal ideation with limited intensity, and duration, some specificity in terms of plans, no associated intent, good self-control, limited dysphoria/symptomatology, some risk factors present, and identifiable protective factors, including available and accessible social support.  PLAN OF CARE: Patient will be admitted to inpatient psychiatric unit for stabilization and safety. Will provide and encourage milieu participation. Provide medication management and maked adjustments as needed.  Will follow daily.    I certify that inpatient services furnished can reasonably be expected to improve the patient's condition.   Nehemiah MassedOBOS, FERNANDO, MD 10/28/2016, 1:56 PM

## 2016-10-28 NOTE — Consult Note (Signed)
History and Physical   ISBELLA ARLINE ZOX:[id number] DOB: 1966-04-14 DOA: 10/27/2016  Referring MD/NP/PA: Dr. Jama Flavors PCP: Jeanine Luz, FNP  Chief Complaint: Hyperglycemia  HPI: ROX MCGRIFF is a 51 y.o. female with a history of anxiety and depression, substance abuse, and diabetes admitted to [facility] 1/24 after intentional trazodone overdose.   She notes worsening depression for several months and has subsequently not taken medicines (insulin and metformin) as prescribed. She was noted to be hyperglycemic at admission and [facility] called for assistance with management. She has been taking neurontin for neuropathy. She's been prescribed insulin for years, since diagnosis 5 years ago. Her A1c has been as high as 14%. She endorses waxing/waning blurry vision, intermittent polyuria and polydipsia.   She also endorses chronic (decades) symmetric bilateral leg swelling worst at nights and when on her feet, better in the morning and with elevation, not associated with pain/redness. This has worsened modestly since admission because she's no longer in bed all day. No chest pain or shortness of breath.   Review of Systems: Per HPI. All others reviewed and are negative.   Past Medical History:  Diagnosis Date  . Anxiety and depression   . Chronic back pain   . Diabetes mellitus without complication (HCC)   . GERD (gastroesophageal reflux disease)   . Hypertension   . Panic attack   . Peripheral neuropathy (HCC)   . Positive TB test   . Substance abuse   . Substance induced mood disorder (HCC)   . UTI (lower urinary tract infection)     Past Surgical History:  Procedure Laterality Date  . ABDOMINAL HYSTERECTOMY    . CHOLECYSTECTOMY    . ROTATOR CUFF REPAIR    . TONSILLECTOMY     - Smokes cigarettes daily, uses amphetamines and cocaine. Denies EtOH. Has trouble affording medications.  No Known Allergies  Family History  Problem Relation Age of Onset  . Diabetes Mellitus II Father   .  Colon cancer Father   . Hyperlipidemia Father   . Diabetes Father   . Arthritis Mother   . Hypertension Mother   . CAD Neg Hx    - Family history negative for thyroid disorders, otherwise reviewed and not pertinent.  Prior to Admission medications   Medication Sig Start Date End Date Taking? Authorizing Provider  gabapentin (NEURONTIN) 300 MG capsule Take 1 capsule (300 mg total) by mouth 3 (three) times daily. Patient taking differently: Take 600 mg by mouth 3 (three) times daily.  04/08/15  Yes Veryl Speak, FNP  ibuprofen (ADVIL,MOTRIN) 200 MG tablet Take 800 mg by mouth every 6 (six) hours as needed.   Yes Historical Provider, MD  omeprazole (PRILOSEC OTC) 20 MG tablet Take 20 mg by mouth daily.   Yes Historical Provider, MD  traZODone (DESYREL) 100 MG tablet Take 150 mg by mouth at bedtime.   Yes Historical Provider, MD  citalopram (CELEXA) 20 MG tablet Take 1 tablet (20 mg total) by mouth daily. Patient not taking: Reported on 10/26/2016 01/20/15   Veryl Speak, FNP  metFORMIN (GLUCOPHAGE) 500 MG tablet Take 1 tablet (500 mg total) by mouth 2 (two) times daily with a meal. Patient not taking: Reported on 10/26/2016 01/20/15   Veryl Speak, FNP  sulfamethoxazole-trimethoprim (BACTRIM DS,SEPTRA DS) 800-160 MG per tablet Take 1 tablet by mouth 2 (two) times daily. Patient not taking: Reported on 10/26/2016 04/08/15   Veryl Speak, FNP    Physical Exam: Vitals:   10/27/16 1530  10/27/16 1531 10/28/16 0635 10/28/16 0636  BP: 128/89 107/75 (!) 134/100 (!) 112/94  Pulse: 93 99 93 (!) 105  Resp: 18  18   Temp: 98.1 F (36.7 C)  98.4 F (36.9 C)   TempSrc: Oral  Oral   Weight: 133.8 kg (295 lb)     Height: 5' 8.25" (1.734 m)      Constitutional: Obese 51 y.o. female in no distress, calm demeanor Eyes: Lids and conjunctivae normal, PERRL ENMT: Mucous membranes are moist. Posterior pharynx clear of any exudate or lesions. Poor dentition.  Neck: normal, supple, no masses, no  thyromegaly Respiratory: Non-labored breathing room air without accessory muscle use. Clear breath sounds to auscultation bilaterally Cardiovascular: Regular rate and rhythm, no murmurs, rubs, or gallops. No carotid bruits. No JVD. 2+ nonpitting LE edema. 2+ pedal pulses. Abdomen: Normoactive bowel sounds. No tenderness, non-distended, and no masses palpated. No hepatosplenomegaly. GU: No indwelling catheter Musculoskeletal: No clubbing / cyanosis. No joint deformity upper and lower extremities. Good ROM, no contractures. Normal muscle tone.  Skin: Warm, dry. No rashes, wounds, no ulcers. No significant lesions noted.  Neurologic: CN II-XII grossly intact. Gait normal. Speech normal. No focal deficits in motor strength or sensation in all extremities.  Psychiatric: Alert and oriented x3. Normal judgment and insight. Mood depressed with flattened affect.   Labs on Admission: I have personally reviewed following labs and imaging studies  CBC:  Recent Labs Lab 10/26/16 1229  WBC 11.3*  NEUTROABS 8.3*  HGB 15.5*  HCT 44.5  MCV 84.1  PLT 207   Basic Metabolic Panel:  Recent Labs Lab 10/26/16 1229 10/27/16 1140  NA 128* 129*  K 5.0 3.9  CL 97* 98*  CO2 21* 21*  GLUCOSE 397* 324*  BUN 12 11  CREATININE 0.63 0.62  CALCIUM 8.3* 8.7*   GFR: Estimated Creatinine Clearance: 121.1 mL/min (by C-G formula based on SCr of 0.62 mg/dL). Liver Function Tests:  Recent Labs Lab 10/26/16 1229 10/27/16 1140  AST 38 25  ALT 21 23  ALKPHOS 117 89  BILITOT 1.4* 0.4  PROT 6.4* 5.8*  ALBUMIN 3.5 3.2*   CBG:  Recent Labs Lab 10/27/16 1705 10/27/16 2054 10/28/16 0620 10/28/16 1149 10/28/16 1709  GLUCAP 318* 229* 186* 285* 332*   Radiological Exams on Admission: Dg Chest 2 View  Result Date: 10/27/2016 CLINICAL DATA:  Reported history of tuberculosis. Medical clearance. EXAM: CHEST  2 VIEW COMPARISON:  None. FINDINGS: Heart and mediastinal contours are within normal limits. No  focal opacities or effusions. No acute bony abnormality. IMPRESSION: No active cardiopulmonary disease. Electronically Signed   By: Charlett NoseKevin  Dover M.D.   On: 10/27/2016 11:15   Assessment/Plan Active Problems:   Diabetes mellitus (HCC)   MDD (major depressive disorder), recurrent severe, without psychosis (HCC)   Chronic venous insufficiency   Hyponatremia   Diabetes mellitus: Presumed poorly controlled. Last HbA1c in system in 2015 was 11.3%. Has hyperglycemia without anion gap. Cr wnl. Started 30 units BID of 70/30 insulin and CBGs remain uncontrolled. Fasting CBG this AM was 186. Short term goal is to average < 180mg /dl to avoid osmotic diuresis.  - Will keep on 70/30 for cost. Increase 70/30 to 40 units in the AM, continue 30 units in PM, add SSI and modify regimen based on 24hr insulin requirements. - Recheck BMP and HbA1c in AM - Ok to continue metformin, would aim to increase to 1g BID if no GI symptoms as her BMI of 44 portends significant insulin resistance. -  Carb-modified diet.   Leg swelling: Chronic, per patient. Consistent with venous insufficiency. Mild hypoalbuminemia may be contributing. Echocardiogram in 2015 showed EF 55-60%, grade 2 diastolic dysfunction RV systolic function wnl. CXR on admission without effusion, edema, or cardiomegaly.  - Check TSH in AM with chronic edema, depression/lethargy. - Recommend compression stockings. - Keep legs elevated - Optimize nutritional status  Hyponatremia: Mild, and appears chronic. Largely exaggerated by hyperglycemia (corrected Na 133). - Would treat hyperglycemia as above prior to medication changes (e.g. SSRI) or further work up.  Hazeline Junker, MD [facility] Pager [phone]

## 2016-10-28 NOTE — [facility] Suicide Risk Assessment (Signed)
[facility] INPATIENT:  Family/Significant Other Suicide Prevention Education  Suicide Prevention Education:  Patient Refusal for Family/Significant Other Suicide Prevention Education: The patient Glori Bickersllison B Hinostroza has refused to provide written consent for family/significant other to be provided Family/Significant Other Suicide Prevention Education during admission and/or prior to discharge.  Physician notified.  Baldo DaubJolan Sylus Stgermain 10/28/2016, 1:00 PM

## 2016-10-29 LAB — BASIC METABOLIC PANEL
Anion gap: 11 (ref 5–15)
BUN: 10 mg/dL (ref 6–20)
CO2: 24 mmol/L (ref 22–32)
Calcium: 9.2 mg/dL (ref 8.9–10.3)
Chloride: 105 mmol/L (ref 101–111)
Creatinine, Ser: 0.53 mg/dL (ref 0.44–1.00)
GFR calc non Af Amer: >60 mL/min (ref >60)
Glucose, Bld: 165 mg/dL — ABNORMAL HIGH (ref 65–99)
POTASSIUM: 4.2 mmol/L (ref 3.5–5.1)
SODIUM: 140 mmol/L (ref 135–145)

## 2016-10-29 LAB — GLUCOSE, CAPILLARY
GLUCOSE-CAPILLARY: 158 mg/dL — AB (ref 65–99)
GLUCOSE-CAPILLARY: 146 mg/dL — AB (ref 65–99)
Glucose-Capillary: 225 mg/dL — ABNORMAL HIGH (ref 65–99)

## 2016-10-29 LAB — TSH: TSH: 2.674 u[IU]/mL (ref 0.350–4.500)

## 2016-10-29 MED ORDER — AMLODIPINE BESYLATE 5 MG PO TABS
5.0000 mg | ORAL_TABLET | Freq: Every day | ORAL | Status: DC
  Administered 2016-10-29 – 2016-10-31 (×3): 5 mg via ORAL
  Filled 2016-10-29 (×6): qty 1

## 2016-10-29 NOTE — Progress Notes (Signed)
D: Patient pleasant and cooperative with care this shift and is noted to interact well with peers in the milieu. A: Encourage staff/peer interaction, medication compliance, and group participation. Administer medications as ordered, maintain Q 15 minute safety checks. R: Pt compliant with medications and attended group session. Pt denies SI at this time and verbally contracts for safety. No signs/symptoms of distress noted. 

## 2016-10-29 NOTE — Progress Notes (Signed)
Recreation Therapy Notes  Date: 10/29/16 Time: 0930 Location: 300 Hall Group Room  Group Topic: Stress Management  Goal Area(s) Addresses:  Patient will verbalize importance of using healthy stress management.  Patient will identify positive emotions associated with healthy stress management.   Intervention: Stress Management  Activity :  Progressive Muscle Relaxation.  LRT introduced the stress management technique of progressive muscle relaxation.  LRT read script to allow patients to engage fully in the activity.  Patients were to follow along while LRT read script to participate in the technique.  Education:  Stress Management, Discharge Planning.   Education Outcome: Acknowledges edcuation/In group clarification offered/Needs additional education  Clinical Observations/Feedback: Pt did not attend group.   Caroll RancherMarjette Tyese Finken, LRT/CTRS         Caroll RancherLindsay, Dillen Belmontes A 10/29/2016 11:58 AM

## 2016-10-29 NOTE — Progress Notes (Addendum)
                Medical Consultation   Glori Bickersllison B Laris  WUJ:[id number]  DOB: 02/22/1966  DOA: 10/27/2016  PCP: Jeanine Luzalone, Gregory, FNP   Brief Hx- [facility], with PMH history of anxiety and depression, substance abuse, and diabetes [facility].   She notes worsening depression for several months and has subsequently not taken medicines (insulin and metformin) as prescribed. She was noted to be hyperglycemic at admission and [facility] called for assistance with management. She has been taking neurontin for neuropathy. She's been prescribed insulin for years, since diagnosis 5 years ago.   ROS  Physical Exam: Vitals:   10/28/16 0635 10/28/16 0636 10/29/16 0617 10/29/16 1130  BP: (!) 134/100 (!) 112/94 (!) 147/105 (!) 165/96  Pulse: 93 (!) 105 95 81  Resp: 18  20 16   Temp: 98.4 F (36.9 C)  97.8 F (36.6 C)   TempSrc: Oral  Oral   Weight:      Height:       Constitutional: Appearance,  Alert and awake, oriented x3, not in any acute distress. CVS: S1-S2 clear, no murmur rubs or gallops,  Respiratory:  clear to auscultation bilaterally, no wheezing, rales or rhonchi. Respiratory effort normal. No accessory muscle use.  Abdomen: soft, obese  Musculoskeletal: : puffy appearing feet, no pitting today  Data reviewed:  I have personally reviewed following labs and imaging studies Labs:  CBC:  Impression/Recommendations Active Problems:   Diabetes mellitus (HCC)   MDD (major depressive disorder), recurrent severe, without psychosis (HCC)   Chronic venous insufficiency   Hyponatremia  Diabetes mellitus: Presumed poorly controlled. Last HbA1c in system in 2015 was 11.3%. Has hyperglycemia without anion gap.  - Started on 70/30,  - HbA1c pending - metformin was continued. - Carb-modified diet.  - SSI- Mod - Pt has no PCP, encourage f/u with [facility] on discharge  Elevated blood pressures-  Not consistently  elevated, per chart. On repeat while evaluating- 140/110. Pt reports anxiety and stress. - Will start Norvasc 5mg , and f/u outpt,   Leg swelling: Chronic, per patient. Consistent with venous insufficiency. Echocardiogram in 2015 showed EF 55-60%, grade 2 diastolic dysfunction RV systolic function wnl. CXR on admission without effusion, edema, or cardiomegaly.  - keep legs elevated - Compression stockings- could not be ordered inpt.  Onnie BoerEjiroghene E Acie Custis M.D. [facility] 10/29/2016, 5:44 PM [phone]

## 2016-10-29 NOTE — Progress Notes (Signed)
D: Pt presents with depressed affect and mood.  When asked about her day, she states "it's ending better than it began."  Pt reports she was anxious and nauseous earlier today but feels better now.  Denies SI/HI, denies hallucinations, denies pain.  Pt attended evening group.    A: Introduced self to pt.  Support and encouragement offered.  Medication administered per order.  Q15 minute safety checks performed.    R: Pt is compliant with medication.  She is safe on the unit and she verbally contracts for safety.

## 2016-10-29 NOTE — Plan of Care (Signed)
Problem: Medication: Goal: Compliance with prescribed medication regimen will improve Outcome: Progressing Pt has been compliant with scheduled medication tonight.    

## 2016-10-29 NOTE — Progress Notes (Signed)
[name] is generally uncomfortable and says it is quite hard for her to stay in the now and to not worry about how chaotic her life is... A She is seen often in the dayroom....UAL without diff and seen interacting with her peers. ASHe comp-leted her daily assessment and on it she worte she denid SI today and she rated her dperessin, hopelessness and anxiety " 6/  / 10+", respectively. R Safety in place.

## 2016-10-29 NOTE — [facility] Group Notes (Signed)
[facility] LCSW Group Therapy 10/29/2016 1:15pm  Type of Therapy: Group Therapy- Feelings Around Relapse and Recovery  Pt did not attend, declined invitation.   Makayia Duplessis, LCSW [phone] 10/29/2016 3:49 PM  

## 2016-10-29 NOTE — Progress Notes (Addendum)
[facility] MD Progress Note  10/29/2016 10:54 AM Claudia Erickson  MRN:  [id number] Subjective:  Patient reports she is feeling better, less depressed, although still not back to baseline, and still feeling vaguely depressed and anxious. States she had an unexpected visit from her adult son last night which helped her feel better. Does not endorse medication side effects- does describe some nausea, but is unsure if it is medication related. Denies any vomiting. At this time denies any suicidal ideations Objective : I have discussed case with treatment team. Patient continues to present vaguely depressed, anxious, and was briefly tearful during session, but states that in general she is feeling better than prior to admission and does present with a more reactive affect. Denies medication side effects, except for mild nausea, no vomiting, and has been able to eat meals without difficulty Denies any current active suicidal ideations and contracts for safety on unit Behavior on unit calm and in good control, visible in milieu, going to groups Appreciate hospitalist consultation regarding DM, hyponatremia 1/26 labs reviewed - NA+ normalized 140, glucose serum level improved ( 165)   Principal Problem: Major Depression  Diagnosis:   Patient Active Problem List   Diagnosis Date Noted  . Chronic venous insufficiency [I87.2] 10/28/2016  . Hyponatremia [E87.1] 10/28/2016  . Hyperglycemia [R73.9]   . Intentional drug overdose ([place]) [T50.902A]   . Suicidal ideation [R45.851]   . Urinary frequency [R35.0] 04/08/2015  . Anxiety and depression [F41.8] 12/26/2014  . Insomnia [G47.00] 12/26/2014  . MDD (major depressive disorder), recurrent severe, without psychosis ([place]) [F33.2] 08/27/2014  . Depressive disorder [F32.9] 07/28/2014  . Aspiration into airway [T17.908A] 12/06/2013  . Aspiration pneumonia ([place]) [J69.0] 12/06/2013  . Polysubstance abuse [F19.10] 12/06/2013  . Diabetes mellitus ([place]) [E11.9]  12/06/2013  . Syncope [R55] 12/06/2013  . Leucocytosis [D72.829] 12/06/2013  . Acute respiratory failure ([place]) [J96.00] 12/06/2013  . Elevated LFTs [R79.89] 12/06/2013   Total Time spent with patient: 20 minutes  Past Medical History:  Past Medical History:  Diagnosis Date  . Anxiety and depression   . Chronic back pain   . Diabetes mellitus without complication ([place])   . GERD (gastroesophageal reflux disease)   . Hypertension   . Panic attack   . Peripheral neuropathy ([place])   . Positive TB test   . Substance abuse   . Substance induced mood disorder ([place])   . UTI (lower urinary tract infection)     Past Surgical History:  Procedure Laterality Date  . ABDOMINAL HYSTERECTOMY    . CHOLECYSTECTOMY    . ROTATOR CUFF REPAIR    . TONSILLECTOMY     Family History:  Family History  Problem Relation Age of Onset  . Diabetes Mellitus II Father   . Colon cancer Father   . Hyperlipidemia Father   . Diabetes Father   . Arthritis Mother   . Hypertension Mother   . CAD Neg Hx    Social History:  History  Alcohol Use No     History  Drug Use  . Types: Cocaine    Comment: Heroine    Social History   Social History  . Marital status: Single    Spouse name: N/A  . Number of children: 2  . 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 Smoking status: Current Every Day Smoker    Packs/day: 0.50    Years: 30.00    Types:  Cigarettes  . Smokeless tobacco: Never Used  . Alcohol use No  . Drug use: Yes    Types: Cocaine     Comment: Heroine  . Sexual activity: Not Asked   Other Topics Concern  . None   Social History Narrative   Born and raised in [place], [place]. Fun: Doesn't do a thing - never leaves the house.   Denies religious beliefs effecting health care.    Additional Social History:   Sleep: Good  Appetite:  Good  Current Medications: Current Facility-Administered Medications  Medication Dose  Route Frequency Provider Last Rate Last Dose  . acetaminophen (TYLENOL) tablet 650 mg  650 mg Oral Q6H PRN Shuvon B Rankin, NP      . alum & mag hydroxide-simeth (MAALOX/MYLANTA) 200-200-20 MG/5ML suspension 30 mL  30 mL Oral Q4H PRN Shuvon B Rankin, NP      . citalopram (CELEXA) tablet 10 mg  10 mg Oral Daily Jenne Campus, MD   10 mg at 10/29/16 0819  . gabapentin (NEURONTIN) capsule 300 mg  300 mg Oral TID Shuvon B Rankin, NP   300 mg at 10/29/16 0819  . hydrocortisone cream 0.5 %   Topical TID Kerrie Buffalo, NP      . insulin aspart (novoLOG) injection 0-15 Units  0-15 Units Subcutaneous TID WC Patrecia Pour, MD   3 Units at 10/29/16 [phone]  . insulin aspart (novoLOG) injection 0-5 Units  0-5 Units Subcutaneous QHS Patrecia Pour, MD   2 Units at 10/28/16 2054  . insulin aspart protamine- aspart (NOVOLOG MIX 70/30) injection 30 Units  30 Units Subcutaneous Q supper Patrecia Pour, MD      . insulin aspart protamine- aspart (NOVOLOG MIX 70/30) injection 40 Units  40 Units Subcutaneous Q breakfast Patrecia Pour, MD   40 Units at 10/29/16 [phone]  . LORazepam (ATIVAN) tablet 0.5 mg  0.5 mg Oral Q8H PRN Shuvon B Rankin, NP   0.5 mg at 10/29/16 0821  . magnesium hydroxide (MILK OF MAGNESIA) suspension 30 mL  30 mL Oral Daily PRN Shuvon B Rankin, NP      . metFORMIN (GLUCOPHAGE) tablet 500 mg  500 mg Oral BID WC Shuvon B Rankin, NP   500 mg at 10/29/16 0820  . mirtazapine (REMERON) tablet 7.5 mg  7.5 mg Oral QHS Encarnacion Slates, NP   7.5 mg at 10/28/16 2053  . nicotine (NICODERM CQ - dosed in mg/24 hours) patch 21 mg  21 mg Transdermal Daily Shuvon B Rankin, NP   21 mg at 10/29/16 0824  . ondansetron (ZOFRAN) tablet 4 mg  4 mg Oral Q8H PRN Shuvon B Rankin, NP      . pantoprazole (PROTONIX) EC tablet 40 mg  40 mg Oral Daily Shuvon B Rankin, NP   40 mg at 10/29/16 9485    Lab Results:  Results for orders placed or performed during the hospital encounter of 10/27/16 (from the past 48 hour(s))  Glucose,  capillary     Status: Abnormal   Collection Time: 10/27/16  5:05 PM  Result Value Ref Range   Glucose-Capillary 318 (H) 65 - 99 mg/dL   Comment 1 Notify RN    Comment 2 Document in Chart   Glucose, capillary     Status: Abnormal   Collection Time: 10/27/16  8:54 PM  Result Value Ref Range   Glucose-Capillary 229 (H) 65 - 99 mg/dL  Glucose, capillary     Status: Abnormal   Collection  Time: 10/28/16  6:20 AM  Result Value Ref Range   Glucose-Capillary 186 (H) 65 - 99 mg/dL  Glucose, capillary     Status: Abnormal   Collection Time: 10/28/16 11:49 AM  Result Value Ref Range   Glucose-Capillary 285 (H) 65 - 99 mg/dL   Comment 1 Notify RN    Comment 2 Document in Chart   Glucose, capillary     Status: Abnormal   Collection Time: 10/28/16  5:09 PM  Result Value Ref Range   Glucose-Capillary 332 (H) 65 - 99 mg/dL   Comment 1 Notify RN    Comment 2 Document in Chart   Glucose, capillary     Status: Abnormal   Collection Time: 10/28/16  8:41 PM  Result Value Ref Range   Glucose-Capillary 244 (H) 65 - 99 mg/dL   Comment 1 Notify RN   Glucose, capillary     Status: Abnormal   Collection Time: 10/29/16  5:50 AM  Result Value Ref Range   Glucose-Capillary 158 (H) 65 - 99 mg/dL  Basic metabolic panel     Status: Abnormal   Collection Time: 10/29/16  6:05 AM  Result Value Ref Range   Sodium 140 135 - 145 mmol/L   Potassium 4.2 3.5 - 5.1 mmol/L   Chloride 105 101 - 111 mmol/L   CO2 24 22 - 32 mmol/L   Glucose, Bld 165 (H) 65 - 99 mg/dL   BUN 10 6 - 20 mg/dL   Creatinine, Ser 0.53 0.44 - 1.00 mg/dL   Calcium 9.2 8.9 - 10.3 mg/dL   GFR calc non Af Amer >60 >60 mL/min   GFR calc Af Amer >60 >60 mL/min    Comment: (NOTE) The eGFR has been calculated using the CKD EPI equation. This calculation has not been validated in all clinical situations. eGFR's persistently <60 mL/min signify possible Chronic Kidney Disease.    Anion gap 11 5 - 15    Comment: Performed at [facility] [address], [place] [address] [place], [place] [address]  TSH     Status: None   Collection Time: 10/29/16  6:05 AM  Result Value Ref Range   TSH 2.674 0.350 - 4.500 uIU/mL    Comment: Performed by a 3rd Generation assay with a functional sensitivity of <=0.01 uIU/mL. Performed at [facility], [place] [address], [place], [place] [address]     Blood Alcohol level:  Lab Results  Component Value Date   [facility] <5 10/26/2016   ETH <11 90/24/0973    Metabolic Disorder Labs: Lab Results  Component Value Date   HGBA1C 11.3 (H) 12/06/2013   MPG 278 (H) 12/06/2013   No results found for: PROLACTIN No results found for: CHOL, TRIG, HDL, CHOLHDL, VLDL, LDLCALC  Physical Findings: AIMS: Facial and Oral Movements Muscles of Facial Expression: None, normal Lips and Perioral Area: None, normal Jaw: None, normal Tongue: None, normal,Extremity Movements Upper (arms, wrists, hands, fingers): None, normal Lower (legs, knees, ankles, toes): None, normal, Trunk Movements Neck, shoulders, hips: None, normal, Overall Severity Severity of abnormal movements (highest score from questions above): None, normal Incapacitation due to abnormal movements: None, normal Patient's awareness of abnormal movements (rate only patient's report): No Awareness, Dental Status Current problems with teeth and/or dentures?: No Does patient usually wear dentures?: No  CIWA:    COWS:     Musculoskeletal: Strength & Muscle Tone: within normal limits Gait & Station: normal Patient leans: N/A  Psychiatric Specialty Exam: Physical Exam  ROS  denies headache, no chest pain, no shortness of breath, (+) nausea, no vomiting , peripheral edema ( bilateral)   Blood pressure (!) 147/105, pulse 95, temperature 97.8 F (36.6 C), temperature source Oral, resp. rate 20, height 5' 8.25" (1.734 m), weight 133.8 kg (295 lb).Body mass index is 44.53 kg/m.  General Appearance: improved grooming   Eye  Contact:  Good  Speech:  Normal Rate  Volume:  Normal  Mood:  less severely depressed   Affect:  remains constricted , anxious, but partially improved compared to admission  Thought Process:  Linear  Orientation:  Full (Time, Place, and Person)  Thought Content:  denies hallucinations, no delusions, not internally preoccupied   Suicidal Thoughts:  No at this time denies suicidal ideations, plans or intention, and contracts for safety on unit   Homicidal Thoughts:  No denies any homicidal or violent ideations   Memory:  recent and remote grossly intact   Judgement:  Other:  improving   Insight:  improving   Psychomotor Activity:  Normal  Concentration:  Concentration: Good and Attention Span: Good  Recall:  Good  Fund of Knowledge:  Good  Language:  Good  Akathisia:  Negative  Handed:  Right  AIMS (if indicated):     Assets:  Desire for Improvement Resilience  ADL's:  Intact  Cognition:  WNL  Sleep:  Number of Hours: 6.75   Assessment - patient partially improved compared to admission- remains depressed, but is less constricted, with a more reactive affect. Denies SI at this time. Currently does not endorse medication side effects. Hospitalist following for hyponatremia, which is felt to be partially related to hyperglycemia.   Treatment Plan Summary: Daily contact with patient to assess and evaluate symptoms and progress in treatment, Medication management, Plan inpatient management  and medications as below Encourage ongoing group and milieu participation to work on coping skills and symptom reduction Continue Celexa 10 mgrs QDAY for depression, anxiety Continue Remeron 7.5 mgrs QHS for depression, insomnia Continue Neurontin 300 mgrs TID for anxiety, pain, peripheral neuropathy Continue DM management  Continue Pantoprazole for GERD  Hospitalist Service following- have reviewed case, improved Na, elevated BP- they will see again today for ongoing management.  Neita Garnet,  MD 10/29/2016, 10:54 AM

## 2016-10-29 NOTE — Progress Notes (Signed)
Adult Psychoeducational Group Note  Date:  10/29/2016 Time:  9:40 PM  Group Topic/Focus:  Wrap-Up Group:   The focus of this group is to help patients review their daily goal of treatment and discuss progress on daily workbooks.  Participation Level:  Active  Participation Quality:  Appropriate  Affect:  Appropriate  Cognitive:  Appropriate  Insight: Appropriate  Engagement in Group:  Engaged  Modes of Intervention:  Discussion  Additional Comments:  Patient attended group and said that her day was a 7. She was excited because her husband stopped by and surprised her.   Aristeo Hankerson W Shelsey Rieth 10/29/2016, 9:40 PM

## 2016-10-30 DIAGNOSIS — I872 Venous insufficiency (chronic) (peripheral): Secondary | ICD-10-CM

## 2016-10-30 LAB — GLUCOSE, CAPILLARY
Glucose-Capillary: 173 mg/dL — ABNORMAL HIGH (ref 65–99)
Glucose-Capillary: 185 mg/dL — ABNORMAL HIGH (ref 65–99)
Glucose-Capillary: 209 mg/dL — ABNORMAL HIGH (ref 65–99)
Glucose-Capillary: 198 mg/dL — ABNORMAL HIGH (ref 65–99)

## 2016-10-30 LAB — HEMOGLOBIN A1C
HEMOGLOBIN A1C: 12 % — AB (ref 4.8–5.6)
Mean Plasma Glucose: 298 mg/dL

## 2016-10-30 MED ORDER — METFORMIN HCL 850 MG PO TABS
850.0000 mg | ORAL_TABLET | Freq: Two times a day (BID) | ORAL | Status: DC
  Administered 2016-10-30 – 2016-11-05 (×12): 850 mg via ORAL
  Filled 2016-10-30: qty 1
  Filled 2016-10-30: qty 14
  Filled 2016-10-30 (×8): qty 1
  Filled 2016-10-30: qty 14
  Filled 2016-10-30 (×7): qty 1

## 2016-10-30 MED ORDER — IBUPROFEN 400 MG PO TABS
400.0000 mg | ORAL_TABLET | Freq: Two times a day (BID) | ORAL | Status: DC | PRN
  Administered 2016-11-01 – 2016-11-04 (×3): 400 mg via ORAL
  Filled 2016-10-30 (×3): qty 1

## 2016-10-30 MED ORDER — LORAZEPAM 0.5 MG PO TABS
0.5000 mg | ORAL_TABLET | Freq: Once | ORAL | Status: AC
  Administered 2016-10-30: 0.5 mg via ORAL

## 2016-10-30 NOTE — Progress Notes (Signed)
[facility] Group Notes:  (Nursing/MHT/Case Management/Adjunct)  Date:  10/30/2016  Time:  11:23 PM  Type of Therapy:  Psychoeducational Skills  Participation Level:  Minimal  Participation Quality:  Resistant  Affect:  Depressed and Flat  Cognitive:  Lacking  Insight:  Limited  Engagement in Group:  Resistant  Modes of Intervention:  Education  Summary of Progress/Problems: Patient states that she had a difficult day overall but did not go into further detail. In terms of the theme for the day, her coping skill will be "journaling".   Hazle CocaGOODMAN, Fany Cavanaugh S 10/30/2016, 11:23 PM

## 2016-10-30 NOTE — Progress Notes (Signed)
[facility] MD Progress Note  10/30/2016 1:13 PM Claudia Erickson  MRN:  [id number] Subjective:  Im doing ok. My anxiety still pretty high. I would say it is an 8/10 and that is after my ativan that Im getting. I overdosed on sleeping pills, and I been off my meds for awhile.   Objective : I have discussed case with treatment team. Patient continues to present vaguely depressed and anxious, despite medication that she is receiving. Patient is encouraged to continue to use coping skills, and attend therapeutic sessions to help with depressive and anxiety symptoms. She is receiving Celexa 11m po daily, Ativan 0.521mpo q8 hr prn, Mirtazapine 7.66m89mo qhs. Denies medication side effects, except for mild nausea, no vomiting, and has been able to eat meals without difficulty. Denies any current active suicidal ideations and contracts for safety on unit. Behavior on unit calm and in good control, visible in milieu, going to groups. Her goal today is to do a gratitude list which she currently has 50 things listed from God to toilet paper.   Principal Problem: Major Depression  Diagnosis:   Patient Active Problem List   Diagnosis Date Noted  . Chronic venous insufficiency [I87.2] 10/28/2016  . Hyponatremia [E87.1] 10/28/2016  . Hyperglycemia [R73.9]   . Intentional drug overdose (HCCHanafordT50.902A]   . Suicidal ideation [R45.851]   . Urinary frequency [R35.0] 04/08/2015  . Anxiety and depression [F41.8] 12/26/2014  . Insomnia [G47.00] 12/26/2014  . MDD (major depressive disorder), recurrent severe, without psychosis (HCCHarlingenF33.2] 08/27/2014  . Depressive disorder [F32.9] 07/28/2014  . Aspiration into airway [T17.908A] 12/06/2013  . Aspiration pneumonia (HCCCripple CreekJ69.0] 12/06/2013  . Polysubstance abuse [F19.10] 12/06/2013  . Diabetes mellitus (HCCSeth WardE11.9] 12/06/2013  . Syncope [R55] 12/06/2013  . Leucocytosis [D72.829] 12/06/2013  . Acute respiratory failure (HCCCastorlandJ96.00] 12/06/2013  . Elevated LFTs [R79.89]  12/06/2013   Total Time spent with patient: 20 minutes  Past Medical History:  Past Medical History:  Diagnosis Date  . Anxiety and depression   . Chronic back pain   . Diabetes mellitus without complication (HCCThe Villages . GERD (gastroesophageal reflux disease)   . Hypertension   . Panic attack   . Peripheral neuropathy (HCCSoledad . Positive TB test   . Substance abuse   . Substance induced mood disorder (HCCTimber Cove . UTI (lower urinary tract infection)     Past Surgical History:  Procedure Laterality Date  . ABDOMINAL HYSTERECTOMY    . CHOLECYSTECTOMY    . ROTATOR CUFF REPAIR    . TONSILLECTOMY     Family History:  Family History  Problem Relation Age of Onset  . Diabetes Mellitus II Father   . Colon cancer Father   . Hyperlipidemia Father   . Diabetes Father   . Arthritis Mother   . Hypertension Mother   . CAD Neg Hx    Social History:  History  Alcohol Use No     History  Drug Use  . Types: Cocaine    Comment: Heroine    Social History   Social History  . Marital status: Single    Spouse name: N/A  . Number of children: 2  . 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 Smoking status: Current Every Day Smoker    Packs/day: 0.50    Years: 30.00    Types: Cigarettes  . Smokeless tobacco: Never Used  .  Alcohol use No  . Drug use: Yes    Types: Cocaine     Comment: Heroine  . Sexual activity: Not Asked   Other Topics Concern  . None   Social History Narrative   Born and raised in [place], [place]. Fun: Doesn't do a thing - never leaves the house.   Denies religious beliefs effecting health care.    Additional Social History:   Sleep: Good  Appetite:  Good  Current Medications: Current Facility-Administered Medications  Medication Dose Route Frequency Provider Last Rate Last Dose  . acetaminophen (TYLENOL) tablet 650 mg  650 mg Oral Q6H PRN Shuvon B Rankin, NP      . alum & mag  hydroxide-simeth (MAALOX/MYLANTA) 200-200-20 MG/5ML suspension 30 mL  30 mL Oral Q4H PRN Shuvon B Rankin, NP      . amLODipine (NORVASC) tablet 5 mg  5 mg Oral Daily Ejiroghene Arlyce Dice, MD   5 mg at 10/30/16 0819  . citalopram (CELEXA) tablet 10 mg  10 mg Oral Daily Jenne Campus, MD   10 mg at 10/30/16 0819  . gabapentin (NEURONTIN) capsule 300 mg  300 mg Oral TID Shuvon B Rankin, NP   300 mg at 10/30/16 0819  . hydrocortisone cream 0.5 %   Topical TID Kerrie Buffalo, NP      . insulin aspart (novoLOG) injection 0-15 Units  0-15 Units Subcutaneous TID WC Patrecia Pour, MD   3 Units at 10/30/16 1220  . insulin aspart (novoLOG) injection 0-5 Units  0-5 Units Subcutaneous QHS Patrecia Pour, MD   2 Units at 10/28/16 2054  . insulin aspart protamine- aspart (NOVOLOG MIX 70/30) injection 30 Units  30 Units Subcutaneous Q supper Patrecia Pour, MD   30 Units at 10/29/16 1655  . insulin aspart protamine- aspart (NOVOLOG MIX 70/30) injection 40 Units  40 Units Subcutaneous Q breakfast Patrecia Pour, MD   40 Units at 10/30/16 [id number]  . LORazepam (ATIVAN) tablet 0.5 mg  0.5 mg Oral Q8H PRN Shuvon B Rankin, NP   0.5 mg at 10/30/16 0824  . magnesium hydroxide (MILK OF MAGNESIA) suspension 30 mL  30 mL Oral Daily PRN Shuvon B Rankin, NP      . metFORMIN (GLUCOPHAGE) tablet 500 mg  500 mg Oral BID WC Shuvon B Rankin, NP   500 mg at 10/30/16 0819  . mirtazapine (REMERON) tablet 7.5 mg  7.5 mg Oral QHS Encarnacion Slates, NP   7.5 mg at 10/29/16 2101  . nicotine (NICODERM CQ - dosed in mg/24 hours) patch 21 mg  21 mg Transdermal Daily Shuvon B Rankin, NP   21 mg at 10/30/16 0826  . ondansetron (ZOFRAN) tablet 4 mg  4 mg Oral Q8H PRN Shuvon B Rankin, NP      . pantoprazole (PROTONIX) EC tablet 40 mg  40 mg Oral Daily Shuvon B Rankin, NP   40 mg at 10/30/16 7322    Lab Results:  Results for orders placed or performed during the hospital encounter of 10/27/16 (from the past 48 hour(s))  Glucose, capillary     Status:  Abnormal   Collection Time: 10/28/16  5:09 PM  Result Value Ref Range   Glucose-Capillary 332 (H) 65 - 99 mg/dL   Comment 1 Notify RN    Comment 2 Document in Chart   Glucose, capillary     Status: Abnormal   Collection Time: 10/28/16  8:41 PM  Result Value Ref Range   Glucose-Capillary 244 (  H) 65 - 99 mg/dL   Comment 1 Notify RN   Glucose, capillary     Status: Abnormal   Collection Time: 10/29/16  5:50 AM  Result Value Ref Range   Glucose-Capillary 158 (H) 65 - 99 mg/dL  Basic metabolic panel     Status: Abnormal   Collection Time: 10/29/16  6:05 AM  Result Value Ref Range   Sodium 140 135 - 145 mmol/L   Potassium 4.2 3.5 - 5.1 mmol/L   Chloride 105 101 - 111 mmol/L   CO2 24 22 - 32 mmol/L   Glucose, Bld 165 (H) 65 - 99 mg/dL   BUN 10 6 - 20 mg/dL   Creatinine, Ser 0.53 0.44 - 1.00 mg/dL   Calcium 9.2 8.9 - 10.3 mg/dL   GFR calc non Af Amer >60 >60 mL/min   GFR calc Af Amer >60 >60 mL/min    Comment: (NOTE) The eGFR has been calculated using the CKD EPI equation. This calculation has not been validated in all clinical situations. eGFR's persistently <60 mL/min signify possible Chronic Kidney Disease.    Anion gap 11 5 - 15    Comment: Performed at [facility], [place] [address] [place], [place] [address]  TSH     Status: None   Collection Time: 10/29/16  6:05 AM  Result Value Ref Range   TSH 2.674 0.350 - 4.500 uIU/mL    Comment: Performed by a 3rd Generation assay with a functional sensitivity of <=0.01 uIU/mL. Performed at [facility], [place] [address] [place], [place] [address]   Hemoglobin A1c     Status: Abnormal   Collection Time: 10/29/16  6:05 AM  Result Value Ref Range   Hgb A1c MFr Bld 12.0 (H) 4.8 - 5.6 %    Comment: (NOTE)         Pre-diabetes: 5.7 - 6.4         Diabetes: >6.4         Glycemic control for adults with diabetes: <7.0    Mean Plasma Glucose 298 mg/dL    Comment: (NOTE) Performed At: [facility] [place], [place] [id number] Lindon Romp MD [id number] Performed at [facility], [place] [address], [place], [place] [address]   Glucose, capillary     Status: Abnormal   Collection Time: 10/29/16  4:20 PM  Result Value Ref Range   Glucose-Capillary 225 (H) 65 - 99 mg/dL  Glucose, capillary     Status: Abnormal   Collection Time: 10/29/16  8:59 PM  Result Value Ref Range   Glucose-Capillary 146 (H) 65 - 99 mg/dL   Comment 1 Notify RN   Glucose, capillary     Status: Abnormal   Collection Time: 10/30/16  5:36 AM  Result Value Ref Range   Glucose-Capillary 173 (H) 65 - 99 mg/dL   Comment 1 Notify RN   Glucose, capillary     Status: Abnormal   Collection Time: 10/30/16 12:04 PM  Result Value Ref Range   Glucose-Capillary 185 (H) 65 - 99 mg/dL   Comment 1 Notify RN    Comment 2 Document in Chart     Blood Alcohol level:  Lab Results  Component Value Date   ETH <5 10/26/2016   ETH <11 92/95/7473    Metabolic Disorder Labs: Lab Results  Component Value Date   HGBA1C 12.0 (H) 10/29/2016   MPG 298 10/29/2016   MPG 278 (H) 12/06/2013   No results  found for: PROLACTIN No results found for: CHOL, TRIG, HDL, CHOLHDL, VLDL, LDLCALC  Physical Findings: AIMS: Facial and Oral Movements Muscles of Facial Expression: None, normal Lips and Perioral Area: None, normal Jaw: None, normal Tongue: None, normal,Extremity Movements Upper (arms, wrists, hands, fingers): None, normal Lower (legs, knees, ankles, toes): None, normal, Trunk Movements Neck, shoulders, hips: None, normal, Overall Severity Severity of abnormal movements (highest score from questions above): None, normal Incapacitation due to abnormal movements: None, normal Patient's awareness of abnormal movements (rate only patient's report): No Awareness, Dental Status Current problems with teeth and/or dentures?: No Does patient usually wear dentures?: No  CIWA:    COWS:      Musculoskeletal: Strength & Muscle Tone: within normal limits Gait & Station: normal Patient leans: N/A  Psychiatric Specialty Exam: Physical Exam   ROS  denies headache, no chest pain, no shortness of breath, (+) nausea, no vomiting , peripheral edema ( bilateral)   Blood pressure 112/83, pulse (!) 113, temperature 98.2 F (36.8 C), resp. rate 20, height 5' 8.25" (1.734 m), weight 133.8 kg (295 lb).Body mass index is 44.53 kg/m.  General Appearance: improved grooming   Eye Contact:  Good  Speech:  Normal Rate  Volume:  Normal  Mood:  Anxious and Depressed  Affect:  remains constricted , anxious, but partially improved compared to admission  Thought Process:  Linear  Orientation:  Full (Time, Place, and Person)  Thought Content:  denies hallucinations, no delusions, not internally preoccupied   Suicidal Thoughts:  No at this time denies suicidal ideations, plans or intention, and contracts for safety on unit   Homicidal Thoughts:  No denies any homicidal or violent ideations   Memory:  recent and remote grossly intact   Judgement:  Other:  improving   Insight:  improving   Psychomotor Activity:  Normal  Concentration:  Concentration: Good and Attention Span: Good  Recall:  Good  Fund of Knowledge:  Good  Language:  Good  Akathisia:  Negative  Handed:  Right  AIMS (if indicated):     Assets:  Desire for Improvement Resilience  ADL's:  Intact  Cognition:  WNL  Sleep:  Number of Hours: 6.5   Assessment - patient partially improved compared to admission- remains depressed, but is less constricted, with a more reactive affect. Denies SI at this time. Currently does not endorse medication side effects. Hospitalist following for hyponatremia, which is felt to be partially related to hyperglycemia.   Treatment Plan Summary: Daily contact with patient to assess and evaluate symptoms and progress in treatment, Medication management, Plan inpatient management  and medications  as below Encourage ongoing group and milieu participation to work on coping skills and symptom reduction Continue Celexa 10 mgrs QDAY for depression, anxiety Continue Remeron 7.5 mgrs QHS for depression, insomnia Continue Neurontin 300 mgrs TID for anxiety, pain, peripheral neuropathy Continue DM management  Continue Pantoprazole for GERD  Hospitalist Service following- have reviewed case, improved Na, elevated BP- they will see again today for ongoing management.  Nanci Pina, FNP 10/30/2016, 1:13 PM

## 2016-10-30 NOTE — [facility] Group Notes (Signed)
Adult Therapy Group Note (Clinical Social Work)  Date:  10/30/2016  Time:  10:00-11:00AM  Group Topic/Focus: Fears and Healthy/Unhealthy Coping Skills  Building Self Esteem:   The Focus of this group was to discuss some of the prevalent fears that patients experience, and to identify the commonalities among group members.  An exercise was used to initiate the discussion, followed by writing on the white board a group-generated list of unhealthy coping and healthy coping techniques to deal with each fear.    Participation Level:  Active  Participation Quality:  Appropriate, Attentive, Sharing and Supportive  Affect:  Blunted  Cognitive:  Alert, Appropriate and Oriented  Insight: Appropriate and Good  Engagement in Group:  Engaged  Modes of Intervention:  Discussion, Exploration and Support  Additional Comments:  The patient expressed that a healthy coping skill currently used is coloring, and shared that she realizes she needs to learn more healthy coping skills, wants to learn "a lot" of them, but is not sure what exactly.  Ambrose MantleMareida Grossman-Orr, LCSW 10/30/2016   12:35pm

## 2016-10-30 NOTE — Progress Notes (Signed)
[name] has ahd a hard day. She has been depressed, tearful and quiet much of the day. She became emotional and upset during Life Skills group today and said, afterwards" I never grieved my mother's death..swelling of the ankles I guess its time". A She is observed cring and rocking back and for the in her bed. She allows this Clinical research associatewriter to speak with her and she agrees this is part of the grieving process. She completed  Her daily assessment and on it she worte she deneid SI today and she rated her depression, hopelessness and anxiety " 04/06/09", respectively. R Safety in place.

## 2016-10-30 NOTE — [facility] Group Notes (Signed)
Goals Group  Date:  10/30/2016  Time:   0930  Type of Therapy:  Nurse Education  /  Goals group:  The group is focused on teaching patients how to set daily goals using the accronym [id number].  Participation Level:  Active  Participation Quality:  Appropriate  Affect:  Appropriate  Cognitive:  Alert  Insight:  Appropriate  Engagement in Group:  Engaged  Modes of Intervention:  Education  Summary of Progress/Problems:  Rich BraveDuke, Andres Vest Lynn 10/30/2016, 11:31 AM
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
## 2016-10-30 NOTE — Plan of Care (Signed)
Problem: Safety: Goal: Periods of time without injury will increase Outcome: Progressing Pt has not harmed self or others tonight.  She denies SI/HI and verbally contracts for safety.    

## 2016-10-30 NOTE — Progress Notes (Addendum)
                Medical Consultation   Claudia Erickson  ZOX:[id number]:[id number]  DOB: 01/08/1966  DOA: 10/27/2016  Brief Hx: [facility], with PMH history of anxiety and depression, substance abuse, and diabetes [facility].   She notes worsening depression for several months and has subsequently not taken medicines (insulin and metformin) as prescribed. She was noted to be hyperglycemic at admission and [facility] called for assistance with management. She has been taking neurontin for neuropathy. She's been prescribed insulin for years, since diagnosis 5 years ago.   Subjective: Claudia Erickson is doing well today.  She reports improving polyuria and thirst.  She notes that she is developing carpal tunnel in her left hand which is a chronic issue for her.  She cannot use her braces or an ace bandage due to being on inpatient psych.  She is requesting ibuprofen.   Objective Vitals:   10/29/16 1130 10/29/16 1700 10/30/16 0635 10/30/16 0640  BP: (!) 165/96 (!) 140/110 (!) 156/96 112/83  Pulse: 81  78 (!) 113  Resp: 16  20   Temp:   98.2 F (36.8 C)   TempSrc:      Weight:      Height:       Physical Exam:  Constitutional: Overweight woman, NAD Eyes: Anicteric, no conjunctival injection CV: RR, NR on my exam, no murmur noted Pulm: CTAB, no wheezing Ext: Non pitting edema to bilateral shins, some tenderness to palpation of left shin, but no erythema or warmth.  She was walking without issue.  Psych: Pleasant, reports improved depression  Pertinent labs:  CBG ranging 146 - 185  Assessment/Plan  1. Uncontrolled DM - She was started on 70/30 with good results.  - She has only required about 4 units of sliding scale yesterday - 70/30 40 in the AM and 30 in the PM, would keep this dose - Will up titrate metformin to 850mg  BID, decrease back if any GI upset.  - Needs PCP on discharge - continue gabapentin  2. Elevated BP - On  norvasc, BP improved but now tachycardic - monitor and decrease norvasc to 2.5mg  if needed tomorrow.   3. Leg swelling - Apparently could not have compression stockings, continue elevation when possible - TSH normal  4. Carpal tunnel pain - recommend adding ibuprofen 400mg  BID for pain control.   Debe CoderMULLEN, Claudia Bostic, MD [phone]

## 2016-10-31 DIAGNOSIS — F17210 Nicotine dependence, cigarettes, uncomplicated: Secondary | ICD-10-CM

## 2016-10-31 DIAGNOSIS — Z9889 Other specified postprocedural states: Secondary | ICD-10-CM

## 2016-10-31 DIAGNOSIS — Z8249 Family history of ischemic heart disease and other diseases of the circulatory system: Secondary | ICD-10-CM

## 2016-10-31 DIAGNOSIS — Z90710 Acquired absence of both cervix and uterus: Secondary | ICD-10-CM

## 2016-10-31 DIAGNOSIS — Z9049 Acquired absence of other specified parts of digestive tract: Secondary | ICD-10-CM

## 2016-10-31 LAB — GLUCOSE, CAPILLARY
GLUCOSE-CAPILLARY: 188 mg/dL — AB (ref 65–99)
GLUCOSE-CAPILLARY: 170 mg/dL — AB (ref 65–99)
Glucose-Capillary: 176 mg/dL — ABNORMAL HIGH (ref 65–99)
Glucose-Capillary: 153 mg/dL — ABNORMAL HIGH (ref 65–99)

## 2016-10-31 MED ORDER — LORAZEPAM 0.5 MG PO TABS
0.5000 mg | ORAL_TABLET | Freq: Four times a day (QID) | ORAL | Status: DC | PRN
  Administered 2016-10-31 – 2016-11-01 (×3): 0.5 mg via ORAL
  Filled 2016-10-31 (×3): qty 1

## 2016-10-31 MED ORDER — MIRTAZAPINE 15 MG PO TABS
15.0000 mg | ORAL_TABLET | Freq: Every day | ORAL | Status: DC
  Administered 2016-10-31 – 2016-11-02 (×3): 15 mg via ORAL
  Filled 2016-10-31 (×6): qty 1

## 2016-10-31 NOTE — Progress Notes (Signed)
[facility] Group Notes:  (Nursing/MHT/Case Management/Adjunct)  Date:  10/31/2016  Time:  9:40 PM  Type of Therapy:  Psychoeducational Skills  Participation Level:  Active  Participation Quality:  Appropriate  Affect:  Appropriate  Cognitive:  Appropriate  Insight:  Appropriate  Engagement in Group:  Engaged  Modes of Intervention:  Education  Summary of Progress/Problems: Patient states that she had a good day overall since she was able to "complete some work". She explained that with the assistance of the nurses, she was able to journal and address a number of her feelings. In addition, she stated that she has not felt this good in months. The patient does not have a support system at this time (theme of the day).   Hazle CocaGOODMAN, Zeth Buday S 10/31/2016, 9:40 PM

## 2016-10-31 NOTE — Progress Notes (Signed)
[facility] MD Progress Note  10/31/2016 3:59 PM Claudia Erickson  MRN:  [id number] Subjective:  "My anxiety still pretty high.  My sleep is ok, they are trying something new." Objective:  I have discussed case with treatment team. Patient continues to present vaguely depressed and anxious.  Patient is encouraged to continue to use coping skills, and attend therapeutic sessions to help with depressive and anxiety symptoms.  Denies medication side effects.  Denies any current active suicidal ideations and contracts for safety on unit. Visible in milieu, going to groups.   Principal Problem: Major Depression  Diagnosis:   Patient Active Problem List   Diagnosis Date Noted  . Chronic venous insufficiency [I87.2] 10/28/2016  . Hyponatremia [E87.1] 10/28/2016  . Hyperglycemia [R73.9]   . Intentional drug overdose (HCC) [T50.902A]   . Suicidal ideation [R45.851]   . Urinary frequency [R35.0] 04/08/2015  . Anxiety and depression [F41.8] 12/26/2014  . Insomnia [G47.00] 12/26/2014  . MDD (major depressive disorder), recurrent severe, without psychosis (HCC) [F33.2] 08/27/2014  . Depressive disorder [F32.9] 07/28/2014  . Aspiration into airway [T17.908A] 12/06/2013  . Aspiration pneumonia (HCC) [J69.0] 12/06/2013  . Polysubstance abuse [F19.10] 12/06/2013  . Diabetes mellitus (HCC) [E11.9] 12/06/2013  . Syncope [R55] 12/06/2013  . Leucocytosis [D72.829] 12/06/2013  . Acute respiratory failure (HCC) [J96.00] 12/06/2013  . Elevated LFTs [R79.89] 12/06/2013   Total Time spent with patient: 20 minutes  Past Medical History:  Past Medical History:  Diagnosis Date  . Anxiety and depression   . Chronic back pain   . Diabetes mellitus without complication (HCC)   . GERD (gastroesophageal reflux disease)   . Hypertension   . Panic attack   . Peripheral neuropathy (HCC)   . Positive TB test   . Substance abuse   . Substance induced mood disorder (HCC)   . UTI (lower urinary tract infection)      Past Surgical History:  Procedure Laterality Date  . ABDOMINAL HYSTERECTOMY    . CHOLECYSTECTOMY    . ROTATOR CUFF REPAIR    . TONSILLECTOMY     Family History:  Family History  Problem Relation Age of Onset  . Diabetes Mellitus II Father   . Colon cancer Father   . Hyperlipidemia Father   . Diabetes Father   . Arthritis Mother   . Hypertension Mother   . CAD Neg Hx    Social History:  History  Alcohol Use No     History  Drug Use  . Types: Cocaine    Comment: Heroine    Social History   Social History  . Marital status: Single    Spouse name: N/A  . Number of children: 2  . 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 Smoking status: Current Every Day Smoker    Packs/day: 0.50    Years: 30.00    Types: Cigarettes  . Smokeless tobacco: Never Used  . Alcohol use No  . Drug use: Yes    Types: Cocaine     Comment: Heroine  . Sexual activity: Not Asked   Other Topics Concern  . None   Social History Narrative   Born and raised in [place], [place]. Fun: Doesn't do a thing - never leaves the house.   Denies religious beliefs effecting health care.    Additional Social History:   Sleep: Good  Appetite:  Good  Current Medications: Current Facility-Administered Medications  Medication Dose  Route Frequency Provider Last Rate Last Dose  . acetaminophen (TYLENOL) tablet 650 mg  650 mg Oral Q6H PRN Shuvon B Rankin, NP   650 mg at 10/30/16 1345  . alum & mag hydroxide-simeth (MAALOX/MYLANTA) 200-200-20 MG/5ML suspension 30 mL  30 mL Oral Q4H PRN Shuvon B Rankin, NP      . citalopram (CELEXA) tablet 10 mg  10 mg Oral Daily Craige Cotta, MD   10 mg at 10/31/16 0831  . gabapentin (NEURONTIN) capsule 300 mg  300 mg Oral TID Shuvon B Rankin, NP   300 mg at 10/31/16 1305  . hydrocortisone cream 0.5 %   Topical TID Adonis Brook, NP      . ibuprofen (ADVIL,MOTRIN) tablet 400 mg  400 mg Oral Q12H PRN Inez Catalina, MD      . insulin aspart (novoLOG) injection 0-15 Units  0-15 Units Subcutaneous TID WC Tyrone Nine, MD   3 Units at 10/31/16 1230  . insulin aspart (novoLOG) injection 0-5 Units  0-5 Units Subcutaneous QHS Tyrone Nine, MD   2 Units at 10/28/16 2054  . insulin aspart protamine- aspart (NOVOLOG MIX 70/30) injection 30 Units  30 Units Subcutaneous Q supper Tyrone Nine, MD   30 Units at 10/30/16 1849  . insulin aspart protamine- aspart (NOVOLOG MIX 70/30) injection 40 Units  40 Units Subcutaneous Q breakfast Tyrone Nine, MD   40 Units at 10/31/16 [phone]  . LORazepam (ATIVAN) tablet 0.5 mg  0.5 mg Oral Q6H PRN Adonis Brook, NP      . magnesium hydroxide (MILK OF MAGNESIA) suspension 30 mL  30 mL Oral Daily PRN Shuvon B Rankin, NP      . metFORMIN (GLUCOPHAGE) tablet 850 mg  850 mg Oral BID WC Inez Catalina, MD   850 mg at 10/31/16 0830  . mirtazapine (REMERON) tablet 15 mg  15 mg Oral QHS Adonis Brook, NP      . nicotine (NICODERM CQ - dosed in mg/24 hours) patch 21 mg  21 mg Transdermal Daily Shuvon B Rankin, NP   21 mg at 10/31/16 0829  . ondansetron (ZOFRAN) tablet 4 mg  4 mg Oral Q8H PRN Shuvon B Rankin, NP      . pantoprazole (PROTONIX) EC tablet 40 mg  40 mg Oral Daily Shuvon B Rankin, NP   40 mg at 10/31/16 0830    Lab Results:  Results for orders placed or performed during the hospital encounter of 10/27/16 (from the past 48 hour(s))  Glucose, capillary     Status: Abnormal   Collection Time: 10/29/16  4:20 PM  Result Value Ref Range   Glucose-Capillary 225 (H) 65 - 99 mg/dL  Glucose, capillary     Status: Abnormal   Collection Time: 10/29/16  8:59 PM  Result Value Ref Range   Glucose-Capillary 146 (H) 65 - 99 mg/dL   Comment 1 Notify RN   Glucose, capillary     Status: Abnormal   Collection Time: 10/30/16  5:36 AM  Result Value Ref Range   Glucose-Capillary 173 (H) 65 - 99 mg/dL   Comment 1 Notify RN   Glucose, capillary     Status: Abnormal   Collection Time:  10/30/16 12:04 PM  Result Value Ref Range   Glucose-Capillary 185 (H) 65 - 99 mg/dL   Comment 1 Notify RN    Comment 2 Document in Chart   Glucose, capillary     Status: Abnormal   Collection Time: 10/30/16  4:58 PM  Result Value Ref Range   Glucose-Capillary 209 (H) 65 - 99 mg/dL   Comment 1 Notify RN    Comment 2 Document in Chart   Glucose, capillary     Status: Abnormal   Collection Time: 10/30/16  8:45 PM  Result Value Ref Range   Glucose-Capillary 198 (H) 65 - 99 mg/dL  Glucose, capillary     Status: Abnormal   Collection Time: 10/31/16  6:14 AM  Result Value Ref Range   Glucose-Capillary 188 (H) 65 - 99 mg/dL  Glucose, capillary     Status: Abnormal   Collection Time: 10/31/16 12:03 PM  Result Value Ref Range   Glucose-Capillary 176 (H) 65 - 99 mg/dL    Blood Alcohol level:  Lab Results  Component Value Date   ETH <5 10/26/2016   ETH <11 08/27/2014    Metabolic Disorder Labs: Lab Results  Component Value Date   HGBA1C 12.0 (H) 10/29/2016   MPG 298 10/29/2016   MPG 278 (H) 12/06/2013   No results found for: PROLACTIN No results found for: CHOL, TRIG, HDL, CHOLHDL, VLDL, LDLCALC  Physical Findings: AIMS: Facial and Oral Movements Muscles of Facial Expression: None, normal Lips and Perioral Area: None, normal Jaw: None, normal Tongue: None, normal,Extremity Movements Upper (arms, wrists, hands, fingers): None, normal Lower (legs, knees, ankles, toes): None, normal, Trunk Movements Neck, shoulders, hips: None, normal, Overall Severity Severity of abnormal movements (highest score from questions above): None, normal Incapacitation due to abnormal movements: None, normal Patient's awareness of abnormal movements (rate only patient's report): No Awareness, Dental Status Current problems with teeth and/or dentures?: No Does patient usually wear dentures?: No  CIWA:    COWS:     Musculoskeletal: Strength & Muscle Tone: within normal limits Gait & Station:  normal Patient leans: N/A  Psychiatric Specialty Exam: Physical Exam  Nursing note and vitals reviewed.   ROS denies headache, no chest pain, no shortness of breath, (+) nausea, no vomiting , peripheral edema ( bilateral)   Blood pressure 126/85, pulse (!) 110, temperature 98.3 F (36.8 C), temperature source Oral, resp. rate 16, height 5' 8.25" (1.734 m), weight 133.8 kg (295 lb).Body mass index is 44.53 kg/m.  General Appearance: improved grooming   Eye Contact:  Good  Speech:  Normal Rate  Volume:  Normal  Mood:  Anxious and Depressed  Affect:  remains constricted , anxious, but partially improved compared to admission  Thought Process:  Linear  Orientation:  Full (Time, Place, and Person)  Thought Content:  denies hallucinations, no delusions, not internally preoccupied   Suicidal Thoughts:  No at this time denies suicidal ideations, plans or intention, and contracts for safety on unit   Homicidal Thoughts:  No denies any homicidal or violent ideations   Memory:  recent and remote grossly intact   Judgement:  Other:  improving   Insight:  improving   Psychomotor Activity:  Normal  Concentration:  Concentration: Good and Attention Span: Good  Recall:  Good  Fund of Knowledge:  Good  Language:  Good  Akathisia:  Negative  Handed:  Right  AIMS (if indicated):     Assets:  Desire for Improvement Resilience  ADL's:  Intact  Cognition:  WNL  Sleep:  Number of Hours: 6   Assessment - patient partially improved compared to admission- remains depressed, but is less constricted, with a more reactive affect. Denies SI at this time. Currently does not endorse medication side effects. Hospitalist following for hyponatremia, which  is felt to be partially related to hyperglycemia.   Treatment Plan Summary: Daily contact with patient to assess and evaluate symptoms and progress in treatment, Medication management, Plan inpatient management  and medications as below Encourage ongoing  group and milieu participation to work on coping skills and symptom reduction Continue Celexa 10 mgrs QDAY for depression, anxiety Increase Remeron to 15 mgrs QHS for depression, insomnia Continue Neurontin 300 mgrs TID for anxiety, pain, peripheral neuropathy Continue DM management  Continue Pantoprazole for GERD  Hospitalist Service following- have reviewed case, improved Na, elevated BP- they will see again today for ongoing management.  Lindwood QuaSheila May Syenna Nazir, NP [facility] 10/31/2016, 3:59 PM

## 2016-10-31 NOTE — [facility] Group Notes (Signed)
Healthy SUpport Systems  Date:  10/31/2016  Time:  0930  Type of Therapy:  Nurse Education  /  Healthy Support Systems:  The group focuses on teaching patients how to develop and maintain healthy support systems in order to maintain homeostasis in their lives.  Participation Level:  Active  Participation Quality:  Attentive  Affect:  Depressed  Cognitive:  Alert  Insight:  Appropriate  Engagement in Group:  Engaged  Modes of Intervention:  Education  Summary of Progress/Problems:  Rich BraveDuke, Icis Budreau Lynn 10/31/2016, 1:04 PM

## 2016-10-31 NOTE — Progress Notes (Addendum)
D: Pt presents depressed, but stated she has gone to every group. Pt appeared didn't go into detail with the writer but did inform that the previous nurses were "awesome". Pt asked writer if her sleep meds were increased as promised. Pt has no other questions or concerns.    .A: Sleep meds increased from 7.5mg  to 15 mg. Support and encouragement was offered. 15 min checks continued for safety.  R: Pt remains safe.

## 2016-10-31 NOTE — Progress Notes (Signed)
[facility] Follow up Consult Note:   Reviewed chart today.  Patient eating 100% of meals in the last 24 hours.  Taking 70/30 insulin 40 in the AM and 30 in the PM.  Increase metformin to 850mg  BID yesterday.  A1C 12.0.  CBG improved from averaging in the high 200s - 300s to now averaging 170-180.    Recommendations  DM Continue insulin and metformin at current doses.    Elevated BP - Reviewed vital signs, she appears to be orthostatic based on findings from last 2 days: Sitting SBP in the 150s, dropping to the 120s on standing and mild tachycardia.  She also has been noted in psychiatric notes to be having more anxiety yesterday around family visit and in the evening.  - Would discontinue amlodipine and recheck BP when outpatient to confirm diagnosis of HTN.  - < 1% incidence of orthostasis with this medication, however, given findings would hold medication at this time and reassess with daily vital signs.    Intermittent evaluation of this patient to continue by [facility].    Debe CoderMULLEN, Elloise Roark, MD

## 2016-10-31 NOTE — Progress Notes (Signed)
D) Pt rates her depression at an 8, hopelessness at a 4 and her anxiety at a 10 ++. Pt denies SI and HI. In a 1:1 Pt talks about her anxiety and all the fears she has that she has been living with for a long time. When Pt leaves here she will move away from her husband and go and live with her father who has been diagnosed with  Alzheimer's  And will become his caregiver. Pt. Verbalizes apprehension, and increased fear as to what is actually going to happen. Has cried on and off throughout the shift.  A) Provided with a 1:1. Given support, reassurance and praise. Encouragement given to Pt. Have encouraged Pt to journal her feeling/fears throughout the shift. R) Pt. Denies SI and HI. Working on her issues

## 2016-10-31 NOTE — [facility] Group Notes (Signed)
[facility] Group Notes:  (Clinical Social Work)   10/31/2016    10:00-11:00AM  Summary of Progress/Problems:   The main focus of today's process group was to   1)  discuss the importance of adding supports  2)  define health supports versus unhealthy supports  3)  identify the patient's current unhealthy supports and plan how to handle them  4)  Identify the patient's current healthy supports and plan what to add.  An emphasis was placed on using counselor, doctor, therapy groups, 12-step groups, and problem-specific support groups to expand supports.    The patient expressed full comprehension of the concepts presented, and agreed that there is a need to add more supports.  The patient stated that some of her very healthy supports are no longer available to her due to circumstances in their own lives, including son who is now in jail, father who now has Alzheimer's, and husband who came to visit on alcohol and drugs last night.  She herself is unhealthy support also.  Type of Therapy:  Process Group with Motivational Interviewing  Participation Level:  Active  Participation Quality:  Attentive and Sharing  Affect:  Depressed, Flat and Irritable  Cognitive:  Appropriate  Insight:  Developing/Improving  Engagement in Therapy:  Engaged  Modes of Intervention:   Education, Support and Con[employer] FoodsProcessing  Ananiah Maciolek Grossman-Orr, LCSW 10/31/2016    1:15 PM

## 2016-11-01 LAB — GLUCOSE, CAPILLARY
GLUCOSE-CAPILLARY: 141 mg/dL — AB (ref 65–99)
GLUCOSE-CAPILLARY: 180 mg/dL — AB (ref 65–99)
Glucose-Capillary: 201 mg/dL — ABNORMAL HIGH (ref 65–99)
Glucose-Capillary: 176 mg/dL — ABNORMAL HIGH (ref 65–99)

## 2016-11-01 MED ORDER — CITALOPRAM HYDROBROMIDE 20 MG PO TABS
20.0000 mg | ORAL_TABLET | Freq: Every day | ORAL | Status: DC
  Administered 2016-11-02 – 2016-11-03 (×2): 20 mg via ORAL
  Filled 2016-11-01 (×4): qty 1

## 2016-11-01 MED ORDER — LORAZEPAM 1 MG PO TABS
1.0000 mg | ORAL_TABLET | Freq: Three times a day (TID) | ORAL | Status: DC | PRN
  Administered 2016-11-01 – 2016-11-05 (×10): 1 mg via ORAL
  Filled 2016-11-01 (×10): qty 1

## 2016-11-01 MED ORDER — GABAPENTIN 400 MG PO CAPS
400.0000 mg | ORAL_CAPSULE | Freq: Three times a day (TID) | ORAL | Status: DC
  Administered 2016-11-02 – 2016-11-05 (×11): 400 mg via ORAL
  Filled 2016-11-01 (×4): qty 1
  Filled 2016-11-01: qty 21
  Filled 2016-11-01 (×4): qty 1
  Filled 2016-11-01: qty 21
  Filled 2016-11-01: qty 1
  Filled 2016-11-01: qty 21
  Filled 2016-11-01 (×4): qty 1

## 2016-11-01 NOTE — [facility] Group Notes (Signed)
[facility] LCSW Group Therapy  11/01/2016 2:30 PM  Type of Therapy:  Group Therapy  Participation Level:  Active  Participation Quality:  Attentive  Affect:  Appropriate  Cognitive:  Alert  Insight:  Improving  Engagement in Therapy:  Improving  Modes of Intervention:  Discussion, Education, Socialization and Support  Summary of Progress/Problems:Balance in life: Patients will discuss the concept of balance and how it looks and feels to be unbalanced. Pt will identify areas in their life that is unbalanced and ways to become more balanced.    Ervin Hensley L Zeva Leber MSW, LCSWA  11/01/2016, 2:30 PM   

## 2016-11-01 NOTE — Consult Note (Signed)
   Consult F/U Note  The patient's chart has been reviewed.  Vital signs are stable with a very mild tachycardia.  CBGs have been trending down with all values since 6 AM yesterday 188 or less.    Encourage po intake/fluids.    Cont current DM regimen w/o change.  TR[facility] will cont to follow intermittently.    No charge today.    Lonia BloodJeffrey T. McClung, MD [facility] Office  [phone] Pager - Text Page per Amion as per below:  On-Call/Text Page:      [email or web address]      password [email or web address]  If 7PM-7AM, please contact night-coverage [email or web address] Password [email or web address] 11/01/2016, 9:21 AM

## 2016-11-01 NOTE — Progress Notes (Signed)
[facility] MD Progress Note  11/01/2016 6:23 PM GERLEAN CID  MRN:  [id number] Subjective:  Patient is reporting severe anxiety. Also reports ongoing depression, although somewhat improved compared to admission. Continues to ruminate about psychosocial stressors which she describes as severe- she was living with mother,who has passed away, and is now in process of moving in with father, but she states this is not ideal, as father very depressed, demanding, and " I feel he will cause me to get worse". States she continues to have passive SI, but no plan or intention and identifies love for her grandchild as a protective factor against suicide . Objective:  I have discussed case with treatment team. Patient continues to present  depressed and reports she feels her anxiety is worsening, particularly after her ex husband, whom she had started dating again, " showed up to visit me totally drunk". She states she has a poor support network, and limited housing options- plan is to go live with father, but is reluctant to do so, as feels it would be a negative environment and cause increased depression. Presents anxious, ruminative, slightly restless , wringing hands when discussing above stressors. No disruptive or agitated behaviors on unit, going to some groups   Principal Problem: Major Depression  Diagnosis:   Patient Active Problem List   Diagnosis Date Noted  . Chronic venous insufficiency [I87.2] 10/28/2016  . Hyponatremia [E87.1] 10/28/2016  . Hyperglycemia [R73.9]   . Intentional drug overdose (HCC) [T50.902A]   . Suicidal ideation [R45.851]   . Urinary frequency [R35.0] 04/08/2015  . Anxiety and depression [F41.8] 12/26/2014  . Insomnia [G47.00] 12/26/2014  . MDD (major depressive disorder), recurrent severe, without psychosis (HCC) [F33.2] 08/27/2014  . Depressive disorder [F32.9] 07/28/2014  . Aspiration into airway [T17.908A] 12/06/2013  . Aspiration pneumonia (HCC) [J69.0] 12/06/2013  .  Polysubstance abuse [F19.10] 12/06/2013  . Diabetes mellitus (HCC) [E11.9] 12/06/2013  . Syncope [R55] 12/06/2013  . Leucocytosis [D72.829] 12/06/2013  . Acute respiratory failure (HCC) [J96.00] 12/06/2013  . Elevated LFTs [R79.89] 12/06/2013   Total Time spent with patient: 20 minutes  Past Medical History:  Past Medical History:  Diagnosis Date  . Anxiety and depression   . Chronic back pain   . Diabetes mellitus without complication (HCC)   . GERD (gastroesophageal reflux disease)   . Hypertension   . Panic attack   . Peripheral neuropathy (HCC)   . Positive TB test   . Substance abuse   . Substance induced mood disorder (HCC)   . UTI (lower urinary tract infection)     Past Surgical History:  Procedure Laterality Date  . ABDOMINAL HYSTERECTOMY    . CHOLECYSTECTOMY    . ROTATOR CUFF REPAIR    . TONSILLECTOMY     Family History:  Family History  Problem Relation Age of Onset  . Diabetes Mellitus II Father   . Colon cancer Father   . Hyperlipidemia Father   . Diabetes Father   . Arthritis Mother   . Hypertension Mother   . CAD Neg Hx    Social History:  History  Alcohol Use No     History  Drug Use  . Types: Cocaine    Comment: Heroine    Social History   Social History  . Marital status: Single    Spouse name: N/A  . Number of children: 2  . 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  Smoking status: Current Every Day Smoker    Packs/day: 0.50    Years: 30.00    Types: Cigarettes  . Smokeless tobacco: Never Used  . Alcohol use No  . Drug use: Yes    Types: Cocaine     Comment: Heroine  . Sexual activity: Not Asked   Other Topics Concern  . None   Social History Narrative   [place] Fun: Doesn't do a thing - never leaves the house.   Denies religious beliefs effecting health care.    Additional Social History:   Sleep: fair   Appetite:   Good  Current Medications: Current Facility-Administered Medications  Medication Dose Route Frequency Provider Last Rate Last Dose  . acetaminophen (TYLENOL) tablet 650 mg  650 mg Oral Q6H PRN Shuvon B Rankin, NP   650 mg at 10/30/16 1345  . alum & mag hydroxide-simeth (MAALOX/MYLANTA) 200-200-20 MG/5ML suspension 30 mL  30 mL Oral Q4H PRN Shuvon B Rankin, NP      . Melene Muller[START ON 11/02/2016] citalopram (CELEXA) tablet 20 mg  20 mg Oral Daily Craige CottaFernando A Cobos, MD      . Melene Muller[START ON 11/02/2016] gabapentin (NEURONTIN) capsule 400 mg  400 mg Oral TID Craige CottaFernando A Cobos, MD      . hydrocortisone cream 0.5 %   Topical TID Adonis BrookSheila Agustin, NP      . ibuprofen (ADVIL,MOTRIN) tablet 400 mg  400 mg Oral Q12H PRN Inez CatalinaEmily B Mullen, MD   400 mg at 11/01/16 0849  . insulin aspart (novoLOG) injection 0-15 Units  0-15 Units Subcutaneous TID WC Tyrone Nineyan B Grunz, MD   5 Units at 11/01/16 1724  . insulin aspart (novoLOG) injection 0-5 Units  0-5 Units Subcutaneous QHS Tyrone Nineyan B Grunz, MD   2 Units at 10/28/16 2054  . insulin aspart protamine- aspart (NOVOLOG MIX 70/30) injection 30 Units  30 Units Subcutaneous Q supper Tyrone Nineyan B Grunz, MD   30 Units at 11/01/16 1725  . insulin aspart protamine- aspart (NOVOLOG MIX 70/30) injection 40 Units  40 Units Subcutaneous Q breakfast Tyrone Nineyan B Grunz, MD   40 Units at 11/01/16 0845  . LORazepam (ATIVAN) tablet 1 mg  1 mg Oral Q8H PRN Rockey SituFernando A Cobos, MD      . magnesium hydroxide (MILK OF MAGNESIA) suspension 30 mL  30 mL Oral Daily PRN Shuvon B Rankin, NP      . metFORMIN (GLUCOPHAGE) tablet 850 mg  850 mg Oral BID WC Inez CatalinaEmily B Mullen, MD   850 mg at 11/01/16 1724  . mirtazapine (REMERON) tablet 15 mg  15 mg Oral QHS Adonis BrookSheila Agustin, NP   15 mg at 10/31/16 2111  . nicotine (NICODERM CQ - dosed in mg/24 hours) patch 21 mg  21 mg Transdermal Daily Shuvon B Rankin, NP   21 mg at 11/01/16 0849  . ondansetron (ZOFRAN) tablet 4 mg  4 mg Oral Q8H PRN Shuvon B Rankin, NP      . pantoprazole (PROTONIX) EC  tablet 40 mg  40 mg Oral Daily Shuvon B Rankin, NP   40 mg at 11/01/16 [id number]    Lab Results:  Results for orders placed or performed during the hospital encounter of 10/27/16 (from the past 48 hour(s))  Glucose, capillary     Status: Abnormal   Collection Time: 10/30/16  8:45 PM  Result Value Ref Range   Glucose-Capillary 198 (H) 65 - 99 mg/dL  Glucose, capillary     Status: Abnormal  Collection Time: 10/31/16  6:14 AM  Result Value Ref Range   Glucose-Capillary 188 (H) 65 - 99 mg/dL  Glucose, capillary     Status: Abnormal   Collection Time: 10/31/16 12:03 PM  Result Value Ref Range   Glucose-Capillary 176 (H) 65 - 99 mg/dL  Glucose, capillary     Status: Abnormal   Collection Time: 10/31/16  5:13 PM  Result Value Ref Range   Glucose-Capillary 153 (H) 65 - 99 mg/dL   Comment 1 Notify RN   Glucose, capillary     Status: Abnormal   Collection Time: 10/31/16  8:43 PM  Result Value Ref Range   Glucose-Capillary 170 (H) 65 - 99 mg/dL   Comment 1 Notify RN   Glucose, capillary     Status: Abnormal   Collection Time: 11/01/16  5:50 AM  Result Value Ref Range   Glucose-Capillary 141 (H) 65 - 99 mg/dL  Glucose, capillary     Status: Abnormal   Collection Time: 11/01/16 11:53 AM  Result Value Ref Range   Glucose-Capillary 180 (H) 65 - 99 mg/dL   Comment 1 Notify RN   Glucose, capillary     Status: Abnormal   Collection Time: 11/01/16  5:17 PM  Result Value Ref Range   Glucose-Capillary 201 (H) 65 - 99 mg/dL   Comment 1 Notify RN     Blood Alcohol level:  Lab Results  Component Value Date   ETH <5 10/26/2016   ETH <11 08/27/2014    Metabolic Disorder Labs: Lab Results  Component Value Date   HGBA1C 12.0 (H) 10/29/2016   MPG 298 10/29/2016   MPG 278 (H) 12/06/2013   No results found for: PROLACTIN No results found for: CHOL, TRIG, HDL, CHOLHDL, VLDL, LDLCALC  Physical Findings: AIMS: Facial and Oral Movements Muscles of Facial Expression: None, normal Lips and  Perioral Area: None, normal Jaw: None, normal Tongue: None, normal,Extremity Movements Upper (arms, wrists, hands, fingers): None, normal Lower (legs, knees, ankles, toes): None, normal, Trunk Movements Neck, shoulders, hips: None, normal, Overall Severity Severity of abnormal movements (highest score from questions above): None, normal Incapacitation due to abnormal movements: None, normal Patient's awareness of abnormal movements (rate only patient's report): No Awareness, Dental Status Current problems with teeth and/or dentures?: No Does patient usually wear dentures?: No  CIWA:    COWS:     Musculoskeletal: Strength & Muscle Tone: within normal limits Gait & Station: normal Patient leans: N/A  Psychiatric Specialty Exam: Physical Exam  Nursing note and vitals reviewed.   ROS denies headache, no chest pain, no shortness of breath, (+) nausea, no vomiting , peripheral edema ( bilateral)   Blood pressure (!) 129/101, pulse (!) 101, temperature 98.7 F (37.1 C), temperature source Oral, resp. rate 18, height 5' 8.25" (1.734 m), weight 133.8 kg (295 lb).Body mass index is 44.53 kg/m.  General Appearance: Fairly Groomed  Eye Contact:  Good  Speech:  Normal Rate  Volume:  Normal  Mood:  Remains depressed, stresses anxiety as major symptoms  Affect:  Constricted, anxious , but affect improves partially during session  Thought Process:  Linear  Orientation:  Full (Time, Place, and Person)  Thought Content:  denies hallucinations, no delusions, not internally preoccupied   Suicidal Thoughts:   Describes some passive thoughts of death, but denies plan or intention of suicide or of hurting self and contracts for safety on unit   Homicidal Thoughts:  No denies any homicidal or violent ideations   Memory:  recent  and remote grossly intact   Judgement:  Other:  improving   Insight:  improving   Psychomotor Activity:  Normal  Concentration:  Concentration: Good and Attention Span: Good   Recall:  Good  Fund of Knowledge:  Good  Language:  Good  Akathisia:  Negative  Handed:  Right  AIMS (if indicated):     Assets:  Desire for Improvement Resilience  ADL's:  Intact  Cognition:  WNL  Sleep:  Number of Hours: 5.75   Assessment - patient presents depressed, constricted in affect and reporting increased anxiety, which she attributes to psychosocial stressors and limited support network. She describes some passive SI, but denies plan or intention , contracts for safety, and remains future oriented. Denies medication side effects. Stresses anxiety as severe.    Treatment Plan Summary: Daily contact with patient to assess and evaluate symptoms and progress in treatment, Medication management, Plan inpatient management  and medications as below Encourage ongoing group and milieu participation to work on coping skills and symptom reduction Increase Celexa to 20 mgrs QDAY for depression, anxiety Continue  Remeron  15 mgrs QHS for depression, insomnia Increase Ativan to  1 mgr Q 8 hours PRN for anxiety  Increase  Neurontin to  400  mgrs TID for anxiety, pain, peripheral neuropathy Continue DM management  Continue Pantoprazole for GERD  Treatment team working on disposition planning options    Nehemiah Massed, MD  11/01/2016, 6:23 PM Patient ID: Claudia Erickson, female   DOB: [birth year], 51 y.o.   MRN: [id number]

## 2016-11-01 NOTE — Progress Notes (Signed)
[facility] Group Notes:  (Nursing/MHT/Case Management/Adjunct)  Date:  11/01/2016  Time:  11:09 PM  Type of Therapy:  Psychoeducational Skills  Participation Level:  Active  Participation Quality:  Appropriate  Affect:  Depressed  Cognitive:  Appropriate  Insight:  Appropriate  Engagement in Group:  Developing/Improving  Modes of Intervention:  Education  Summary of Progress/Problems: The patient expressed in group that she had a "positive" day and that she is concerned about some decisions that she has to make in the near future. In terms of the theme for the day, her wellness strategy will be to remain on her medication. She admits to not filling her medication box correctly at times and occasionally does not take the medication. She understands that her life becomes unmanageable when she is off of her medication.   Charis Juliana S 11/01/2016, 11:09 PM

## 2016-11-01 NOTE — Progress Notes (Signed)
Recreation Therapy Notes  Date: 11/01/16 Time: 0930 Location: [address] Hall Group Room  Group Topic: Stress Management  Goal Area(s) Addresses:  Patient will verbalize importance of using healthy stress management.  Patient will identify positive emotions associated with healthy stress management.   Intervention: Stress Management  Activity :  Daily Calm.  LRT introduced the stress management technique of meditation to the group.  LRT played a meditation from the Calm App to allow patients the opportunity to engage in the meditation.  Patients were to follow along with the meditation to fully engage in the technique.  Education:  Stress Management, Discharge Planning.   Education Outcome: Acknowledges edcuation/In group clarification offered/Needs additional education  Clinical Observations/Feedback: Pt did not attend group.    Caroll RancherMarjette Alexsa Flaum, LRT/CTRS         Caroll RancherLindsay, Corrina Steffensen A 11/01/2016 12:22 PM

## 2016-11-01 NOTE — Progress Notes (Signed)
Patient ID: Claudia Erickson, female   DOB: 05/02/1966, 51 y.o.   MRN: [id number]  DAR: Pt. Denies SI/HI and A/V Hallucinations. She reports sleep is good, appetite is good, energy level is low, and concentration is good. She rates depression 6/10, hopelessness 6/10, and anxiety 10/10. She received PRN Ativan which provided some relief. Patient reports pain in her left wrist and received PRN medication. She reports minimal relief on reassessment. Support and encouragement provided to the patient. Patient is guarded, irritable, and minimal with this Clinical research associatewriter. Patient reports on her daily inventory sheet that she feels like she is not ready to discharge yet. She appears teary eyed in the dayroom before lunch today. Patient refuses to wear her TED hose at this time. She reports they are uncomfortable and thus will not wear them. Orthostatic vitals were taken this morning per order. Q15 minute checks are maintained for safety.

## 2016-11-01 NOTE — Plan of Care (Signed)
Problem: Activity: Goal: Sleeping patterns will improve Outcome: Progressing Patient reports sleep is good.

## 2016-11-01 NOTE — Progress Notes (Signed)
D:  Pt informed the writer that her day was "soso". Stated she trying to decide whether to go live her her "bedridden dad" or go take care of herself. Stated she's going to speak to the SW tomorrow and get more information. "It may not be a fit for me anyway".  Pt has no other questions or concerns.    A:  Support and encouragement was offered. 15 min checks continued for safety.  R: Pt remains safe.

## 2016-11-02 DIAGNOSIS — E1149 Type 2 diabetes mellitus with other diabetic neurological complication: Secondary | ICD-10-CM

## 2016-11-02 LAB — GLUCOSE, CAPILLARY
GLUCOSE-CAPILLARY: 128 mg/dL — AB (ref 65–99)
GLUCOSE-CAPILLARY: 116 mg/dL — AB (ref 65–99)
GLUCOSE-CAPILLARY: 203 mg/dL — AB (ref 65–99)
Glucose-Capillary: 172 mg/dL — ABNORMAL HIGH (ref 65–99)

## 2016-11-02 NOTE — Tx Team (Signed)
Interdisciplinary Treatment and Diagnostic Plan Update  11/02/2016 Time of Session: 1:09 PM  Claudia Erickson MRN: [id number]  Principal Diagnosis: MDD, no psychotic features   Secondary Diagnoses: Active Problems:   Diabetes mellitus (HCC)   MDD (major depressive disorder), recurrent severe, without psychosis (HCC)   Chronic venous insufficiency   Hyponatremia   Current Medications:  Current Facility-Administered Medications  Medication Dose Route Frequency Provider Last Rate Last Dose  . acetaminophen (TYLENOL) tablet 650 mg  650 mg Oral Q6H PRN Shuvon B Rankin, NP   650 mg at 10/30/16 1345  . alum & mag hydroxide-simeth (MAALOX/MYLANTA) 200-200-20 MG/5ML suspension 30 mL  30 mL Oral Q4H PRN Shuvon B Rankin, NP      . citalopram (CELEXA) tablet 20 mg  20 mg Oral Daily Craige CottaFernando A Cobos, MD   20 mg at 11/02/16 0852  . gabapentin (NEURONTIN) capsule 400 mg  400 mg Oral TID Craige CottaFernando A Cobos, MD   400 mg at 11/02/16 [id number]  . hydrocortisone cream 0.5 %   Topical TID Adonis BrookSheila Agustin, NP      . ibuprofen (ADVIL,MOTRIN) tablet 400 mg  400 mg Oral Q12H PRN Inez CatalinaEmily B Mullen, MD   400 mg at 11/01/16 0849  . insulin aspart (novoLOG) injection 0-15 Units  0-15 Units Subcutaneous TID WC Tyrone Nineyan B Grunz, MD   2 Units at 11/02/16 [phone]  . insulin aspart (novoLOG) injection 0-5 Units  0-5 Units Subcutaneous QHS Tyrone Nineyan B Grunz, MD   2 Units at 10/28/16 2054  . insulin aspart protamine- aspart (NOVOLOG MIX 70/30) injection 30 Units  30 Units Subcutaneous Q supper Tyrone Nineyan B Grunz, MD   30 Units at 11/01/16 1725  . insulin aspart protamine- aspart (NOVOLOG MIX 70/30) injection 40 Units  40 Units Subcutaneous Q breakfast Tyrone Nineyan B Grunz, MD   40 Units at 11/02/16 0855  . LORazepam (ATIVAN) tablet 1 mg  1 mg Oral Q8H PRN Craige CottaFernando A Cobos, MD   1 mg at 11/02/16 0851  . magnesium hydroxide (MILK OF MAGNESIA) suspension 30 mL  30 mL Oral Daily PRN Shuvon B Rankin, NP      . metFORMIN (GLUCOPHAGE) tablet 850 mg  850 mg Oral BID  WC Inez CatalinaEmily B Mullen, MD   850 mg at 11/02/16 0852  . mirtazapine (REMERON) tablet 15 mg  15 mg Oral QHS Adonis BrookSheila Agustin, NP   15 mg at 11/01/16 2116  . nicotine (NICODERM CQ - dosed in mg/24 hours) patch 21 mg  21 mg Transdermal Daily Shuvon B Rankin, NP   21 mg at 11/02/16 0853  . ondansetron (ZOFRAN) tablet 4 mg  4 mg Oral Q8H PRN Shuvon B Rankin, NP      . pantoprazole (PROTONIX) EC tablet 40 mg  40 mg Oral Daily Shuvon B Rankin, NP   40 mg at 11/02/16 [id number]    PTA Medications: Prescriptions Prior to Admission  Medication Sig Dispense Refill Last Dose  . gabapentin (NEURONTIN) 300 MG capsule Take 1 capsule (300 mg total) by mouth 3 (three) times daily. (Patient taking differently: Take 600 mg by mouth 3 (three) times daily. ) 90 capsule 3 10/25/2016 at Unknown time  . ibuprofen (ADVIL,MOTRIN) 200 MG tablet Take 800 mg by mouth every 6 (six) hours as needed.   10/25/2016 at Unknown time  . omeprazole (PRILOSEC OTC) 20 MG tablet Take 20 mg by mouth daily.   10/25/2016 at Unknown time  . traZODone (DESYREL) 100 MG tablet Take 150 mg by mouth at bedtime.  10/25/2016 at Unknown time  . citalopram (CELEXA) 20 MG tablet Take 1 tablet (20 mg total) by mouth daily. (Patient not taking: Reported on 10/26/2016) 30 tablet 11 Not Taking at Unknown time  . metFORMIN (GLUCOPHAGE) 500 MG tablet Take 1 tablet (500 mg total) by mouth 2 (two) times daily with a meal. (Patient not taking: Reported on 10/26/2016) 60 tablet 11 Not Taking at Unknown time  . sulfamethoxazole-trimethoprim (BACTRIM DS,SEPTRA DS) 800-160 MG per tablet Take 1 tablet by mouth 2 (two) times daily. (Patient not taking: Reported on 10/26/2016) 6 tablet 0 Not Taking at Unknown time    Treatment Modalities: Medication Management, Group therapy, Case management,  1 to 1 session with clinician, Psychoeducation, Recreational therapy.  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  Physician Treatment Plan for Primary Diagnosis: MDD, no psychotic features  Long Term Goal(s): Improvement in symptoms so as ready for discharge  Short Term Goals: Ability to verbalize feelings will improve Ability to disclose and discuss suicidal ideas Ability to demonstrate self-control will improve Ability to identify and develop effective coping behaviors will improve Ability to maintain clinical measurements within normal limits will improve Ability to identify triggers associated with substance abuse/mental health issues will improve  Medication Management: Evaluate patient's response, side effects, and tolerance of medication regimen.  Therapeutic Interventions: 1 to 1 sessions, Unit Group sessions and Medication administration.  Evaluation of Outcomes: Progressing  Physician Treatment Plan for Secondary Diagnosis: Active Problems:   Diabetes mellitus (HCC)   MDD (major depressive disorder), recurrent severe, without psychosis (HCC)   Chronic venous insufficiency   Hyponatremia   Long Term Goal(s): Improvement in symptoms so as ready for discharge  Short Term Goals: Ability to verbalize feelings will improve Ability to disclose and discuss suicidal ideas Ability to demonstrate self-control will improve Ability to identify and develop effective coping behaviors will improve Ability to maintain clinical measurements within normal limits will improve Ability to identify triggers associated with substance abuse/mental health issues will improve  Medication Management: Evaluate patient's response, side effects, and tolerance of medication regimen.  Therapeutic Interventions: 1 to 1 sessions, Unit Group sessions and Medication administration.  Evaluation of Outcomes: Progressing   RN Treatment Plan for Primary Diagnosis:  MDD, no psychotic features  Long Term Goal(s): Knowledge of disease and therapeutic regimen to maintain health will improve  Short Term Goals: Ability to verbalize feelings will improve, Ability to disclose and discuss suicidal ideas and Ability to identify and develop effective coping behaviors will improve  Medication Management: RN will administer medications as ordered by provider, will assess and evaluate patient's response and provide education to patient for prescribed medication. RN will report any adverse and/or side effects to prescribing provider.  Therapeutic Interventions: 1 on 1 counseling sessions, Psychoeducation, Medication administration, Evaluate responses to treatment, Monitor vital signs and CBGs as ordered, Perform/monitor CIWA, COWS, AIMS and Fall Risk screenings as ordered, Perform wound care treatments as ordered.  Evaluation of Outcomes: Progressing   LCSW Treatment Plan for Primary Diagnosis: MDD, no psychotic features  Long Term Goal(s): Safe transition to appropriate next level of care at discharge, Engage patient in therapeutic group addressing interpersonal concerns.  Short Term Goals: Engage patient in aftercare planning with referrals and resources, Identify triggers associated with mental health/substance abuse issues and Increase skills for wellness and recovery  Therapeutic Interventions: Assess for all  discharge needs, 1 to 1 time with Child psychotherapist, Explore available resources and support systems, Assess for adequacy in community support network, Educate family and significant other(s) on suicide prevention, Complete Psychosocial Assessment, Interpersonal group therapy.  Evaluation of Outcomes: Progressing   Progress in Treatment: Attending groups: Yes Participating in groups: Yes Taking medication as prescribed: Yes, MD continues to assess for medication changes as needed Toleration medication: Yes, no side effects reported at this  time Family/Significant other contact made: No, Pt declines Patient understands diagnosis: Yes AEB willingness to participate in treatment Discussing patient identified problems/goals with staff: Yes Medical problems stabilized or resolved: Yes Denies suicidal/homicidal ideation: Yes Issues/concerns per patient self-inventory: None Other: N/A  New problem(s) identified: None identified at this time.   New Short Term/Long Term Goal(s): None identified at this time.   Discharge Plan or Barriers: Pt will return home and follow-up with outpatient services.   Reason for Continuation of Hospitalization: Anxiety Depression Medication stabilization Suicidal ideation  Estimated Length of Stay: 1-2 days  Attendees: Patient: 11/02/2016  1:09 PM  Physician: Dr. Jama Flavors 11/02/2016  1:09 PM  Nursing: Joslyn Devon, RN; Waynetta Sandy, RN 11/02/2016  1:09 PM  RN Care Manager: Onnie Boer, RN 11/02/2016  1:09 PM  Social Worker: Vernie Shanks, LCSW; Heather Smart, LCSW 11/02/2016  1:09 PM  Recreational Therapist:  11/02/2016  1:09 PM  Other: Armandina Stammer, NP; Gray Bernhardt, NP 11/02/2016  1:09 PM  Other:  11/02/2016  1:09 PM  Other: 11/02/2016  1:09 PM    Scribe for Treatment Team: Verdene Lennert, LCSW 11/02/2016 1:09 PM

## 2016-11-02 NOTE — Progress Notes (Signed)
[facility] MD Progress Note  11/02/2016 5:10 PM Claudia Erickson  MRN:  [id number] Subjective:  She reports she is feeling better than prior to admission, but still vaguely depressed. She states, however, that her major ongoing symptom is of anxiety, which she states is chronic. Describes a vague but persistent sense of anxiety and apprehension " like a little kid bringing a bad grade home to her parents ". States " I know my fears and anxieties are not rational, I don't even know why I am so anxious". She states she is feeling less irritable, less angry, and states " I realize that when I am irritable it is often because I am feeling anxious".  Denies medication side effects, and feels it is helping .  Objective:  I have discussed case with treatment team. At this time patient is feeling partially better, but states she remains apprehensive, anxious,which she states is often a subjective sense of apprehension , worry, even when she cannot identify any clear triggers or stressors. She is less focused on disposition planning, states she may be getting a stipend from her siblings to take care of her elderly father, which would allow her some increased independence and less financial concerns. Denies any suicidal ideations. Appreciate Hospitalist follow ups, see consult notes. At this time blood glucose levels have improved .  Patient has been going to groups, participating . No disruptive or agitated behaviors on unit.  Denies medication side effects.     Principal Problem: Major Depression  Diagnosis:   Patient Active Problem List   Diagnosis Date Noted  . Chronic venous insufficiency [I87.2] 10/28/2016  . Hyponatremia [E87.1] 10/28/2016  . Hyperglycemia [R73.9]   . Intentional drug overdose (HCC) [T50.902A]   . Suicidal ideation [R45.851]   . Urinary frequency [R35.0] 04/08/2015  . Anxiety and depression [F41.8] 12/26/2014  . Insomnia [G47.00] 12/26/2014  . MDD (major depressive disorder),  recurrent severe, without psychosis (HCC) [F33.2] 08/27/2014  . Depressive disorder [F32.9] 07/28/2014  . Aspiration into airway [T17.908A] 12/06/2013  . Aspiration pneumonia (HCC) [J69.0] 12/06/2013  . Polysubstance abuse [F19.10] 12/06/2013  . Diabetes mellitus (HCC) [E11.9] 12/06/2013  . Syncope [R55] 12/06/2013  . Leucocytosis [D72.829] 12/06/2013  . Acute respiratory failure (HCC) [J96.00] 12/06/2013  . Elevated LFTs [R79.89] 12/06/2013   Total Time spent with patient: 20 minutes  Past Medical History:  Past Medical History:  Diagnosis Date  . Anxiety and depression   . Chronic back pain   . Diabetes mellitus without complication (HCC)   . GERD (gastroesophageal reflux disease)   . Hypertension   . Panic attack   . Peripheral neuropathy (HCC)   . Positive TB test   . Substance abuse   . Substance induced mood disorder (HCC)   . UTI (lower urinary tract infection)     Past Surgical History:  Procedure Laterality Date  . ABDOMINAL HYSTERECTOMY    . CHOLECYSTECTOMY    . ROTATOR CUFF REPAIR    . TONSILLECTOMY     Family History:  Family History  Problem Relation Age of Onset  . Diabetes Mellitus II Father   . Colon cancer Father   . Hyperlipidemia Father   . Diabetes Father   . Arthritis Mother   . Hypertension Mother   . CAD Neg Hx    Social History:  History  Alcohol Use No     History  Drug Use  . Types: Cocaine    Comment: Heroine    Social History   Social  History  . Marital status: Single    Spouse name: N/A  . Number of children: 2  . 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 Smoking status: Current Every Day Smoker    Packs/day: 0.50    Years: 30.00    Types: Cigarettes  . Smokeless tobacco: Never Used  . Alcohol use No  . Drug use: Yes    Types: Cocaine     Comment: Heroine  . Sexual activity: Not Asked   Other Topics Concern  . None   Social History Narrative   Born  and raised in [place], [place]. Fun: Doesn't do a thing - never leaves the house.   Denies religious beliefs effecting health care.    Additional Social History:   Sleep: fair   Appetite:  Good  Current Medications: Current Facility-Administered Medications  Medication Dose Route Frequency Provider Last Rate Last Dose  . acetaminophen (TYLENOL) tablet 650 mg  650 mg Oral Q6H PRN Shuvon B Rankin, NP   650 mg at 10/30/16 1345  . alum & mag hydroxide-simeth (MAALOX/MYLANTA) 200-200-20 MG/5ML suspension 30 mL  30 mL Oral Q4H PRN Shuvon B Rankin, NP      . citalopram (CELEXA) tablet 20 mg  20 mg Oral Daily Craige Cotta, MD   20 mg at 11/02/16 0852  . gabapentin (NEURONTIN) capsule 400 mg  400 mg Oral TID Craige Cotta, MD   400 mg at 11/02/16 1319  . hydrocortisone cream 0.5 %   Topical TID Adonis Brook, NP      . ibuprofen (ADVIL,MOTRIN) tablet 400 mg  400 mg Oral Q12H PRN Inez Catalina, MD   400 mg at 11/01/16 0849  . insulin aspart (novoLOG) injection 0-15 Units  0-15 Units Subcutaneous TID WC Tyrone Nine, MD   2 Units at 11/02/16 [phone]  . insulin aspart (novoLOG) injection 0-5 Units  0-5 Units Subcutaneous QHS Tyrone Nine, MD   2 Units at 10/28/16 2054  . insulin aspart protamine- aspart (NOVOLOG MIX 70/30) injection 30 Units  30 Units Subcutaneous Q supper Tyrone Nine, MD   30 Units at 11/01/16 1725  . insulin aspart protamine- aspart (NOVOLOG MIX 70/30) injection 40 Units  40 Units Subcutaneous Q breakfast Tyrone Nine, MD   40 Units at 11/02/16 0855  . LORazepam (ATIVAN) tablet 1 mg  1 mg Oral Q8H PRN Craige Cotta, MD   1 mg at 11/02/16 0851  . magnesium hydroxide (MILK OF MAGNESIA) suspension 30 mL  30 mL Oral Daily PRN Shuvon B Rankin, NP      . metFORMIN (GLUCOPHAGE) tablet 850 mg  850 mg Oral BID WC Inez Catalina, MD   850 mg at 11/02/16 0852  . mirtazapine (REMERON) tablet 15 mg  15 mg Oral QHS Adonis Brook, NP   15 mg at 11/01/16 2116  . nicotine (NICODERM CQ -  dosed in mg/24 hours) patch 21 mg  21 mg Transdermal Daily Shuvon B Rankin, NP   21 mg at 11/02/16 0853  . ondansetron (ZOFRAN) tablet 4 mg  4 mg Oral Q8H PRN Shuvon B Rankin, NP      . pantoprazole (PROTONIX) EC tablet 40 mg  40 mg Oral Daily Shuvon B Rankin, NP   40 mg at 11/02/16 9604    Lab Results:  Results for orders placed or performed during the hospital encounter of 10/27/16 (from the  past 48 hour(s))  Glucose, capillary     Status: Abnormal   Collection Time: 10/31/16  5:13 PM  Result Value Ref Range   Glucose-Capillary 153 (H) 65 - 99 mg/dL   Comment 1 Notify RN   Glucose, capillary     Status: Abnormal   Collection Time: 10/31/16  8:43 PM  Result Value Ref Range   Glucose-Capillary 170 (H) 65 - 99 mg/dL   Comment 1 Notify RN   Glucose, capillary     Status: Abnormal   Collection Time: 11/01/16  5:50 AM  Result Value Ref Range   Glucose-Capillary 141 (H) 65 - 99 mg/dL  Glucose, capillary     Status: Abnormal   Collection Time: 11/01/16 11:53 AM  Result Value Ref Range   Glucose-Capillary 180 (H) 65 - 99 mg/dL   Comment 1 Notify RN   Glucose, capillary     Status: Abnormal   Collection Time: 11/01/16  5:17 PM  Result Value Ref Range   Glucose-Capillary 201 (H) 65 - 99 mg/dL   Comment 1 Notify RN   Glucose, capillary     Status: Abnormal   Collection Time: 11/01/16  8:50 PM  Result Value Ref Range   Glucose-Capillary 176 (H) 65 - 99 mg/dL  Glucose, capillary     Status: Abnormal   Collection Time: 11/02/16  5:50 AM  Result Value Ref Range   Glucose-Capillary 128 (H) 65 - 99 mg/dL  Glucose, capillary     Status: Abnormal   Collection Time: 11/02/16 11:59 AM  Result Value Ref Range   Glucose-Capillary 116 (H) 65 - 99 mg/dL   Comment 1 Notify RN     Blood Alcohol level:  Lab Results  Component Value Date   ETH <5 10/26/2016   ETH <11 08/27/2014    Metabolic Disorder Labs: Lab Results  Component Value Date   HGBA1C 12.0 (H) 10/29/2016   MPG 298 10/29/2016    MPG 278 (H) 12/06/2013   No results found for: PROLACTIN No results found for: CHOL, TRIG, HDL, CHOLHDL, VLDL, LDLCALC  Physical Findings: AIMS: Facial and Oral Movements Muscles of Facial Expression: None, normal Lips and Perioral Area: None, normal Jaw: None, normal Tongue: None, normal,Extremity Movements Upper (arms, wrists, hands, fingers): None, normal Lower (legs, knees, ankles, toes): None, normal, Trunk Movements Neck, shoulders, hips: None, normal, Overall Severity Severity of abnormal movements (highest score from questions above): None, normal Incapacitation due to abnormal movements: None, normal Patient's awareness of abnormal movements (rate only patient's report): No Awareness, Dental Status Current problems with teeth and/or dentures?: No Does patient usually wear dentures?: No  CIWA:    COWS:     Musculoskeletal: Strength & Muscle Tone: within normal limits Gait & Station: normal Patient leans: N/A  Psychiatric Specialty Exam: Physical Exam  Nursing note and vitals reviewed.   ROS denies headache, no chest pain, no shortness of breath, (+) nausea, no vomiting , peripheral edema ( bilateral)   Blood pressure 133/90, pulse (!) 105, temperature 98.4 F (36.9 C), temperature source Oral, resp. rate 18, height 5' 8.25" (1.734 m), weight 133.8 kg (295 lb).Body mass index is 44.53 kg/m.  General Appearance: Fairly Groomed  Eye Contact:  Good  Speech:  Normal Rate  Volume:  Normal  Mood:  Reports improving depression, still presents vaguely depressed  Affect: less constricted, remains anxious, not irritable today  Thought Process:  Linear  Orientation:  Full (Time, Place, and Person)  Thought Content:  denies hallucinations, no delusions, not  internally preoccupied   Suicidal Thoughts:  At this time denies suicidal or self injurious ideations, contracts for safety on unit   Homicidal Thoughts:  No denies any homicidal or violent ideations   Memory:  recent  and remote grossly intact   Judgement:  Other:  improving   Insight:  improving   Psychomotor Activity:  Normal  Concentration:  Concentration: Good and Attention Span: Good  Recall:  Good  Fund of Knowledge:  Good  Language:  Good  Akathisia:  Negative  Handed:  Right  AIMS (if indicated):     Assets:  Desire for Improvement Resilience  ADL's:  Intact  Cognition:  WNL  Sleep:  Number of Hours: 6.75   Assessment - patient reports partial improvement of mood and states she feels significantly better than prior to admission, particularly regarding mood and symptoms of depression. She does report ongoing and chronic  anxiety, which she describes in a manner suggestive of GAD. She seems less ruminative and overwhelmed about disposition options, and states she is leaning toward going to live with her elderly father after discharge. Denies any SI. Tolerating medications well thus far .   Treatment Plan Summary: Daily contact with patient to assess and evaluate symptoms and progress in treatment, Medication management, Plan inpatient management  and medications as below Encourage ongoing group and milieu participation to work on coping skills and symptom reduction Continue Celexa  20 mgrs QDAY for depression, anxiety Continue  Remeron  15 mgrs QHS for depression, insomnia Continue Ativan   1 mgr Q 8 hours PRN for anxiety  Continue Neurontin  400  mgrs TID for anxiety, pain, peripheral neuropathy Continue DM management  Continue Pantoprazole for GERD  Treatment team working on disposition planning options    Nehemiah MassedOBOS, Bettina Warn, MD  11/02/2016, 5:10 PM   Patient ID: Claudia Erickson, female   DOB: 09/22/1966, 51 y.o.   MRN: [id number]

## 2016-11-02 NOTE — Progress Notes (Signed)
D:Pt rates depression as an 8 and anxiety as a 10 on 0-10 scale with 10 being the most. Pt reports that she is not wearing TED hose because they roll at the top of her legs. She has been out in the dayroom more this afternoon. Pt has a flat/sad affect. Pt's blood pressure is elevated and will be rechecked with orthostatic blood pressure checked. A:Offered support, encouragement and 15 minute checks. Medication given as ordered.  R:Pt denies si and hi. Safety maintained on the unit.

## 2016-11-02 NOTE — Progress Notes (Signed)
Patient seen and examined. Patient denies dizziness on ambulation.  She has been eating./  CBG better controlled. Would continue with current regimen. Please call us back as needed.  TRIAD will sign off.   Hartley BarefootBelkys Boe Deans, MD.

## 2016-11-02 NOTE — [facility] Group Notes (Signed)
[facility] LCSW Group Therapy 11/02/2016 1:15 PM  Type of Therapy: Group Therapy- Feelings about Diagnosis  Participation Level: Active   Participation Quality:  Appropriate  Affect:  Irritable  Cognitive: Alert and Oriented   Insight:  Developing   Engagement in Therapy: Developing/Improving and Engaged   Modes of Intervention: Clarification, Confrontation, Discussion, Education, Exploration, Limit-setting, Orientation, Problem-solving, Rapport Building, Dance movement psychotherapisteality Testing, Socialization and Support  Description of Group:   This group will allow patients to explore their thoughts and feelings about diagnoses they have received. Patients will be guided to explore their level of understanding and acceptance of these diagnoses. Facilitator will encourage patients to process their thoughts and feelings about the reactions of others to their diagnosis, and will guide patients in identifying ways to discuss their diagnosis with significant others in their lives. This group will be process-oriented, with patients participating in exploration of their own experiences as well as giving and receiving support and challenge from other group members.  Summary of Progress/Problems:  Pt expressed frustration with a general lack of understanding of mental illness. Pt expressed that she has depression and social anxiety. Pt interacted well with peers and was able to identify with their experiences.   Therapeutic Modalities:   Cognitive Behavioral Therapy Solution Focused Therapy Motivational Interviewing Relapse Prevention Therapy  Vernie ShanksLauren Nastassja Witkop, LCSW 11/02/2016 3:51 PM

## 2016-11-02 NOTE — [facility] Group Notes (Signed)
Adult Psychoeducational Group Note  Date:  11/02/2016 Time:  11:22 PM  Group Topic/Focus:  Wrap-Up Group:   The focus of this group is to help patients review their daily goal of treatment and discuss progress on daily workbooks.  Participation Level:  Active  Participation Quality:  Appropriate  Affect:  Appropriate  Cognitive:  Appropriate  Insight: Appropriate   Engagement in Group:  Engaged  Modes of Intervention:  Activity  Additional Comments:  Patient rated her day a 7. Goal is to prepare for discharge.  Claria DiceKiara M Darcel Zick 11/02/2016, 11:22 PM

## 2016-11-03 LAB — GLUCOSE, CAPILLARY
GLUCOSE-CAPILLARY: 129 mg/dL — AB (ref 65–99)
GLUCOSE-CAPILLARY: 127 mg/dL — AB (ref 65–99)
Glucose-Capillary: 122 mg/dL — ABNORMAL HIGH (ref 65–99)
Glucose-Capillary: 196 mg/dL — ABNORMAL HIGH (ref 65–99)

## 2016-11-03 MED ORDER — MIRTAZAPINE 7.5 MG PO TABS
7.5000 mg | ORAL_TABLET | Freq: Every day | ORAL | Status: DC
  Administered 2016-11-03 – 2016-11-04 (×2): 7.5 mg via ORAL
  Filled 2016-11-03 (×3): qty 1
  Filled 2016-11-03: qty 7
  Filled 2016-11-03: qty 1

## 2016-11-03 MED ORDER — ARIPIPRAZOLE 5 MG PO TABS
5.0000 mg | ORAL_TABLET | Freq: Every day | ORAL | Status: DC
  Administered 2016-11-03 – 2016-11-05 (×3): 5 mg via ORAL
  Filled 2016-11-03: qty 1
  Filled 2016-11-03: qty 7
  Filled 2016-11-03 (×5): qty 1

## 2016-11-03 MED ORDER — CITALOPRAM HYDROBROMIDE 10 MG PO TABS
10.0000 mg | ORAL_TABLET | Freq: Every day | ORAL | Status: DC
  Administered 2016-11-04 – 2016-11-05 (×2): 10 mg via ORAL
  Filled 2016-11-03: qty 7
  Filled 2016-11-03 (×3): qty 1

## 2016-11-03 NOTE — [facility] Group Notes (Signed)
[facility] LCSW Group Therapy 11/03/2016 1:15 PM  Type of Therapy: Group Therapy- Emotion Regulation  Pt did not attend, declined invitation.   Vernie ShanksLauren Joanne Salah, LCSW 11/03/2016 3:31 PM

## 2016-11-03 NOTE — Progress Notes (Signed)
[facility] Group Notes:  (Nursing/MHT/Case Management/Adjunct)  Date:  11/03/2016  Time:  10:42 PM  Type of Therapy:  Psychoeducational Skills  Participation Level:  Minimal  Participation Quality:  Resistant  Affect:  Depressed and Flat  Cognitive:  Lacking  Insight:  Limited  Engagement in Group:  Resistant  Modes of Intervention:  Education  Summary of Progress/Problems: Patient verbalized in group that her day "sucked". She had multiple complaints, but also mentioned that she found out where she will be going following discharge. In terms of the theme of the day, her personal development will include staying "calm".   Hazle CocaGOODMAN, Audre Cenci S 11/03/2016, 10:42 PM

## 2016-11-03 NOTE — Progress Notes (Signed)
Pt has been observed in the dayroom most of the evening.  Conversation was minimal with pt as she was not easily engaged in conversation.  She denies SI/HI/AVH.  She feels her medications are working, and she hopes to discharge soon.  She voiced no needs or concerns.  She attended evening group.  She informed Clinical research associatewriter that she wanted to take a shower and go to bed around 9 o'clock.  Pt's medications were given shortly after 9 pm.  Support and encouragement offered.  Pt continues to refuse to wear the TED hose.  Discharge plans are in process.  Safety maintained with q15 minute checks.

## 2016-11-03 NOTE — Progress Notes (Addendum)
[facility] MD Progress Note  11/03/2016 2:54 PM Claudia Erickson  MRN:  [id number] Subjective:  She continues to report  feeling better than prior to admission. However, she states she has been feeling irritable, subjectively angry at times. She attributes this partially to anxiety related to her current stressors( having to do with being in the process of moving in with father to take care of him), and to " being in a closed environment, like having cabin fever".  She does feel medications have helped, and denies side effects. She denies suicidal ideations .   Objective:  I have discussed case with treatment team and have met with patient . She is presenting with partial improvement compared to admission, feels less depressed and is currently future oriented. 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 States she is less upset about this plan, partly as her family members have agreed to remunerate her for this, and as she feels better, states she feels less overwhelmed. As noted, although reports partially improving mood, describes symptoms of feeling irritable, and staff has noted some irritability. Of note, patient states she is sleeping well, does not present with pressured speech, restlessness or grandiose ideations. She is not endorsing any clear history of previous mania. Denies medication side effects.     Principal Problem: Major Depression  Diagnosis:   Patient Active Problem List   Diagnosis Date Noted  . Chronic venous insufficiency [I87.2] 10/28/2016  . Hyponatremia [E87.1] 10/28/2016  . Hyperglycemia [R73.9]   . Intentional drug overdose ([place]) [T50.902A]   . Suicidal ideation [R45.851]   . Urinary frequency [R35.0] 04/08/2015  . Anxiety and depression [F41.8] 12/26/2014  . Insomnia [G47.00] 12/26/2014  . MDD (major depressive disorder), recurrent severe, without psychosis ([place]) [F33.2] 08/27/2014  . Depressive  disorder [F32.9] 07/28/2014  . Aspiration into airway [T17.908A] 12/06/2013  . Aspiration pneumonia ([place]) [J69.0] 12/06/2013  . Polysubstance abuse [F19.10] 12/06/2013  . Diabetes mellitus ([place]) [E11.9] 12/06/2013  . Syncope [R55] 12/06/2013  . Leucocytosis [D72.829] 12/06/2013  . Acute respiratory failure ([place]) [J96.00] 12/06/2013  . Elevated LFTs [R79.89] 12/06/2013   Total Time spent with patient: 20 minutes  Past Medical History:  Past Medical History:  Diagnosis Date  . Anxiety and depression   . Chronic back pain   . Diabetes mellitus without complication ([place])   . GERD (gastroesophageal reflux disease)   . Hypertension   . Panic attack   . Peripheral neuropathy ([place])   . Positive TB test   . Substance abuse   . Substance induced mood disorder ([place])   . UTI (lower urinary tract infection)     Past Surgical History:  Procedure Laterality Date  . ABDOMINAL HYSTERECTOMY    . CHOLECYSTECTOMY    . ROTATOR CUFF REPAIR    . TONSILLECTOMY     Family History:  Family History  Problem Relation Age of Onset  . Diabetes Mellitus II Father   . Colon cancer Father   . Hyperlipidemia Father   . Diabetes Father   . Arthritis Mother   . Hypertension Mother   . CAD Neg Hx    Social History:  History  Alcohol Use No     History  Drug Use  . Types: Cocaine    Comment: Heroine    Social History   Social History  . Marital status: Single    Spouse name: N/A  .  Number of children: 2  . 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 Smoking status: Current Every Day Smoker    Packs/day: 0.50    Years: 30.00    Types: Cigarettes  . Smokeless tobacco: Never Used  . Alcohol use No  . Drug use: Yes    Types: Cocaine     Comment: Heroine  . Sexual activity: Not Asked   Other Topics Concern  . None   Social History Narrative   Born and raised in [place], [place]. Fun: Doesn't do a thing - never leaves the  house.   Denies religious beliefs effecting health care.    Additional Social History:   Sleep: good    Appetite:  Good  Current Medications: Current Facility-Administered Medications  Medication Dose Route Frequency Provider Last Rate Last Dose  . acetaminophen (TYLENOL) tablet 650 mg  650 mg Oral Q6H PRN Shuvon B Rankin, NP   650 mg at 10/30/16 1345  . alum & mag hydroxide-simeth (MAALOX/MYLANTA) 200-200-20 MG/5ML suspension 30 mL  30 mL Oral Q4H PRN Shuvon B Rankin, NP      . citalopram (CELEXA) tablet 20 mg  20 mg Oral Daily Jenne Campus, MD   20 mg at 11/03/16 6945  . gabapentin (NEURONTIN) capsule 400 mg  400 mg Oral TID Jenne Campus, MD   400 mg at 11/03/16 1209  . hydrocortisone cream 0.5 %   Topical TID Kerrie Buffalo, NP      . ibuprofen (ADVIL,MOTRIN) tablet 400 mg  400 mg Oral Q12H PRN Sid Falcon, MD   400 mg at 11/03/16 1209  . insulin aspart (novoLOG) injection 0-15 Units  0-15 Units Subcutaneous TID WC Patrecia Pour, MD   2 Units at 11/03/16 1207  . insulin aspart (novoLOG) injection 0-5 Units  0-5 Units Subcutaneous QHS Patrecia Pour, MD   2 Units at 10/28/16 2054  . insulin aspart protamine- aspart (NOVOLOG MIX 70/30) injection 30 Units  30 Units Subcutaneous Q supper Patrecia Pour, MD   30 Units at 11/02/16 1739  . insulin aspart protamine- aspart (NOVOLOG MIX 70/30) injection 40 Units  40 Units Subcutaneous Q breakfast Patrecia Pour, MD   40 Units at 11/03/16 0809  . LORazepam (ATIVAN) tablet 1 mg  1 mg Oral Q8H PRN Jenne Campus, MD   1 mg at 11/03/16 0814  . magnesium hydroxide (MILK OF MAGNESIA) suspension 30 mL  30 mL Oral Daily PRN Shuvon B Rankin, NP      . metFORMIN (GLUCOPHAGE) tablet 850 mg  850 mg Oral BID WC Sid Falcon, MD   850 mg at 11/03/16 0388  . mirtazapine (REMERON) tablet 15 mg  15 mg Oral QHS Kerrie Buffalo, NP   15 mg at 11/02/16 2114  . nicotine (NICODERM CQ - dosed in mg/24 hours) patch 21 mg  21 mg Transdermal Daily Shuvon B Rankin,  NP   21 mg at 11/03/16 0811  . ondansetron (ZOFRAN) tablet 4 mg  4 mg Oral Q8H PRN Shuvon B Rankin, NP      . pantoprazole (PROTONIX) EC tablet 40 mg  40 mg Oral Daily Shuvon B Rankin, NP   40 mg at 11/03/16 8280    Lab Results:  Results for orders placed or performed during the hospital encounter of 10/27/16 (from the past 48 hour(s))  Glucose, capillary     Status: Abnormal  Collection Time: 11/01/16  5:17 PM  Result Value Ref Range   Glucose-Capillary 201 (H) 65 - 99 mg/dL   Comment 1 Notify RN   Glucose, capillary     Status: Abnormal   Collection Time: 11/01/16  8:50 PM  Result Value Ref Range   Glucose-Capillary 176 (H) 65 - 99 mg/dL  Glucose, capillary     Status: Abnormal   Collection Time: 11/02/16  5:50 AM  Result Value Ref Range   Glucose-Capillary 128 (H) 65 - 99 mg/dL  Glucose, capillary     Status: Abnormal   Collection Time: 11/02/16 11:59 AM  Result Value Ref Range   Glucose-Capillary 116 (H) 65 - 99 mg/dL   Comment 1 Notify RN   Glucose, capillary     Status: Abnormal   Collection Time: 11/02/16  5:28 PM  Result Value Ref Range   Glucose-Capillary 203 (H) 65 - 99 mg/dL   Comment 1 Notify RN   Glucose, capillary     Status: Abnormal   Collection Time: 11/02/16  8:45 PM  Result Value Ref Range   Glucose-Capillary 172 (H) 65 - 99 mg/dL  Glucose, capillary     Status: Abnormal   Collection Time: 11/03/16  6:16 AM  Result Value Ref Range   Glucose-Capillary 129 (H) 65 - 99 mg/dL  Glucose, capillary     Status: Abnormal   Collection Time: 11/03/16 11:58 AM  Result Value Ref Range   Glucose-Capillary 122 (H) 65 - 99 mg/dL    Blood Alcohol level:  Lab Results  Component Value Date   ETH <5 10/26/2016   ETH <11 27/25/3664    Metabolic Disorder Labs: Lab Results  Component Value Date   HGBA1C 12.0 (H) 10/29/2016   MPG 298 10/29/2016   MPG 278 (H) 12/06/2013   No results found for: PROLACTIN No results found for: CHOL, TRIG, HDL, CHOLHDL, VLDL,  LDLCALC  Physical Findings: AIMS: Facial and Oral Movements Muscles of Facial Expression: None, normal Lips and Perioral Area: None, normal Jaw: None, normal Tongue: None, normal,Extremity Movements Upper (arms, wrists, hands, fingers): None, normal Lower (legs, knees, ankles, toes): None, normal, Trunk Movements Neck, shoulders, hips: None, normal, Overall Severity Severity of abnormal movements (highest score from questions above): None, normal Incapacitation due to abnormal movements: None, normal Patient's awareness of abnormal movements (rate only patient's report): No Awareness, Dental Status Current problems with teeth and/or dentures?: No Does patient usually wear dentures?: No  CIWA:    COWS:     Musculoskeletal: Strength & Muscle Tone: within normal limits Gait & Station: normal Patient leans: N/A  Psychiatric Specialty Exam: Physical Exam  Nursing note and vitals reviewed.   ROS denies headache, no chest pain, no shortness of breath, (+) nausea, no vomiting , peripheral edema ( bilateral)   Blood pressure 126/90, pulse 90, temperature 97.8 F (36.6 C), temperature source Oral, resp. rate 18, height 5' 8.25" (1.734 m), weight 133.8 kg (295 lb).Body mass index is 44.53 kg/m.  General Appearance: gradually improving grooming   Eye Contact:  Good  Speech:  Normal Rate- not pressured   Volume:  Normal  Mood: reports feeling better, presents vaguely dysphoric, irritable   Affect: vaguely constricted and irritable   Thought Process:  Linear  Orientation:  Full (Time, Place, and Person)  Thought Content:  denies hallucinations, no delusions, not internally preoccupied   Suicidal Thoughts:  At this time denies suicidal or self injurious ideations, contracts for safety on unit   Homicidal Thoughts:  No denies any homicidal or violent ideations   Memory:  recent and remote grossly intact   Judgement:  Other:  improving   Insight:  improving   Psychomotor Activity:   Normal  Concentration:  Concentration: Good and Attention Span: Good  Recall:  Good  Fund of Knowledge:  Good  Language:  Good  Akathisia:  Negative  Handed:  Right  AIMS (if indicated):     Assets:  Desire for Improvement Resilience  ADL's:  Intact  Cognition:  WNL  Sleep:  Number of Hours: 6.75   Assessment - patient reports she has improved compared to admission and feels medications are helping . Staff has noted patient to be somewhat irritable, angry at times, but behavior on unit in good control, no overtly disruptive or agitated behaviors. Of note, patient acknowledges irritability, which she states is partly due to her chronic  Stressors- she is not endorsing any clear history of mania/and as above, currently no pressured speech, sleep is good/ improved, no flight of ideations, no grandiosity , no restlessness .  Seems more future oriented and less ruminative about her disposition plans , which at this time are to go live with her elderly father. Because of increased  irritability, dysphoria even as depression improves, will adjust medications and starting a mood stabilizer seems warranted- options and side effects discussed. Agrees to Abilify trial.    Treatment Plan Summary: Daily contact with patient to assess and evaluate symptoms and progress in treatment, Medication management, Plan inpatient management  and medications as below Encourage ongoing group and milieu participation to work on coping skills and symptom reduction Decrease  Celexa  To 10  mgrs QDAY for depression, anxiety Decrease  Remeron  To 7.5  mgrs QHS for depression, insomnia Start Abilify 5 mgrs QDAY for mood disorder Continue Ativan   1 mgr Q 8 hours PRN for anxiety  Continue Neurontin  400  mgrs TID for anxiety, pain, peripheral neuropathy Continue DM management  Continue Pantoprazole for GERD  Recheck BMP to monitor NA+ ( history of hyponatremia)  Treatment team working on disposition planning options     Neita Garnet, MD  11/03/2016, 2:54 PM   Patient ID: Claudia Erickson, female   DOB: [birth year], 50 y.o.   MRN: [id number]

## 2016-11-03 NOTE — Progress Notes (Signed)
Recreation Therapy Notes  Date: 11/03/16 Time: 0930 Location: [address] Group Room  Group Topic: Stress Management  Goal Area(s) Addresses:  Patient will verbalize importance of using healthy stress management.  Patient will identify positive emotions associated with healthy stress management.   Intervention: Stress Management  Activity :  Guided Imagery.  LRT introduced the stress management technique of guided imagery.  LRT read a script to allow patients the opportunity to engage in the activity.  Patients were to follow along as LRT read script.  Education:  Stress Management, Discharge Planning.   Education Outcome: Acknowledges edcuation/In group clarification offered/Needs additional education  Clinical Observations/Feedback: Pt did not attend group.   Caroll RancherMarjette Azion Centrella, LRT/CTRS         Caroll RancherLindsay, Nayelly Laughman A 11/03/2016 3:06 PM

## 2016-11-03 NOTE — Progress Notes (Signed)
At the beginning of the shift, pt was lying in her bed reading.  She reports that her day has been bad.  She was hopeful to go stay with her father, but that is not going to happen.  At this time, she is probably going to a shelter, and she is not happy about that.  She says she has things in her mother's home that she wants to keep, but has no place to store them.  She continues irritable and withdrawn.  The previous nurse reported that she was able to get the pt to talk about her situation a little bit, but general the pt keeps to herself.  She has remained calm and cooperative with staff.  She denies SI/HI/AVH.  She makes her needs known to staff.  Support and encouragement offered.  Discharge plans are in process.  Safety maintained with q15 minute checks.

## 2016-11-03 NOTE — Progress Notes (Signed)
D:Pt's mood is irritable and labile. Pt was crying before dinner and says that she is upset because she does not know where she will go following discharge. Pt says that she lived with her mother until she passed away and then started living with her father. She says that her father does not want her to live with him and is concerned about money. She says that her mother's things are at the house and she has no where to store them.  A:Called pt's SW to request consult. Offered support and 15 minute checks.  R:Pt denies si and hi. Safety maintained on the unit.

## 2016-11-04 LAB — BASIC METABOLIC PANEL
Anion gap: 8 (ref 5–15)
BUN: 15 mg/dL (ref 6–20)
CO2: 27 mmol/L (ref 22–32)
Calcium: 9.1 mg/dL (ref 8.9–10.3)
Chloride: 102 mmol/L (ref 101–111)
Creatinine, Ser: 0.6 mg/dL (ref 0.44–1.00)
GFR calc non Af Amer: >60 mL/min (ref >60)
Glucose, Bld: 126 mg/dL — ABNORMAL HIGH (ref 65–99)
POTASSIUM: 4 mmol/L (ref 3.5–5.1)
SODIUM: 137 mmol/L (ref 135–145)

## 2016-11-04 LAB — GLUCOSE, CAPILLARY
GLUCOSE-CAPILLARY: 131 mg/dL — AB (ref 65–99)
GLUCOSE-CAPILLARY: 94 mg/dL (ref 65–99)
GLUCOSE-CAPILLARY: 258 mg/dL — AB (ref 65–99)
GLUCOSE-CAPILLARY: 155 mg/dL — AB (ref 65–99)

## 2016-11-04 NOTE — Progress Notes (Signed)
[facility] MD Progress Note  11/04/2016 4:58 PM ALYSCIA CARMON  MRN:  [id number] Subjective:  Patient reports partial improvement, although states " it has been a difficult day", which she attributes to getting inconsistent messages from her father. States initially her father told her to come live with her after she is discharged, later suggested she go to another place, and now back to telling her he wants her to live with her. States " I am going to go live with my dad, it is my best option, I will help take care of him, and he will help me have a place to stay and family to be with". Denies medication side effects. Denies suicidal ideations. States she remains vaguely anxious, irritable, which she attributes to worry about her current stressors, circumstances, but states she is feeling better. At this time more focused on discharge options, and states she is hoping to discharge home soon.  Objective:  I have discussed case with treatment team and have met with patient . Patient remains vaguely depressed, dysphoric, but less irritable. Behavior on unit in good control. As above, continues to focus on disposition planning , but states she has decided on going to live with her father after discharge. Denies medication side effects, does feel medications are helping . States that today was feeling more anxious, and last night did not sleep as well , related to anxiety about her father giving her inconsistent messages about what he wants her to do. Less concerned about this today.     Principal Problem: Major Depression  Diagnosis:   Patient Active Problem List   Diagnosis Date Noted  . Chronic venous insufficiency [I87.2] 10/28/2016  . Hyponatremia [E87.1] 10/28/2016  . Hyperglycemia [R73.9]   . Intentional drug overdose ([place]) [T50.902A]   . Suicidal ideation [R45.851]   . Urinary frequency [R35.0] 04/08/2015  . Anxiety and depression [F41.8] 12/26/2014  . Insomnia [G47.00] 12/26/2014  . MDD  (major depressive disorder), recurrent severe, without psychosis ([place]) [F33.2] 08/27/2014  . Depressive disorder [F32.9] 07/28/2014  . Aspiration into airway [T17.908A] 12/06/2013  . Aspiration pneumonia ([place]) [J69.0] 12/06/2013  . Polysubstance abuse [F19.10] 12/06/2013  . Diabetes mellitus ([place]) [E11.9] 12/06/2013  . Syncope [R55] 12/06/2013  . Leucocytosis [D72.829] 12/06/2013  . Acute respiratory failure ([place]) [J96.00] 12/06/2013  . Elevated LFTs [R79.89] 12/06/2013   Total Time spent with patient: 20 minutes  Past Medical History:  Past Medical History:  Diagnosis Date  . Anxiety and depression   . Chronic back pain   . Diabetes mellitus without complication ([place])   . GERD (gastroesophageal reflux disease)   . Hypertension   . Panic attack   . Peripheral neuropathy ([place])   . Positive TB test   . Substance abuse   . Substance induced mood disorder ([place])   . UTI (lower urinary tract infection)     Past Surgical History:  Procedure Laterality Date  . ABDOMINAL HYSTERECTOMY    . CHOLECYSTECTOMY    . ROTATOR CUFF REPAIR    . TONSILLECTOMY     Family History:  Family History  Problem Relation Age of Onset  . Diabetes Mellitus II Father   . Colon cancer Father   . Hyperlipidemia Father   . Diabetes Father   . Arthritis Mother   . Hypertension Mother   . CAD Neg Hx    Social History:  History  Alcohol Use No     History  Drug Use  . Types: Cocaine  Comment: Heroine    Social History   Social History  . Marital status: Single    Spouse name: N/A  . Number of children: 2  . 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 Smoking status: Current Every Day Smoker    Packs/day: 0.50    Years: 30.00    Types: Cigarettes  . Smokeless tobacco: Never Used  . Alcohol use No  . Drug use: Yes    Types: Cocaine     Comment: Heroine  . Sexual activity: Not Asked   Other Topics Concern  . None    Social History Narrative   Born and raised in [place], [place]. Fun: Doesn't do a thing - never leaves the house.   Denies religious beliefs effecting health care.    Additional Social History:   Sleep: fair    Appetite:  Good  Current Medications: Current Facility-Administered Medications  Medication Dose Route Frequency Provider Last Rate Last Dose  . acetaminophen (TYLENOL) tablet 650 mg  650 mg Oral Q6H PRN Shuvon B Rankin, NP   650 mg at 10/30/16 1345  . alum & mag hydroxide-simeth (MAALOX/MYLANTA) 200-200-20 MG/5ML suspension 30 mL  30 mL Oral Q4H PRN Shuvon B Rankin, NP      . ARIPiprazole (ABILIFY) tablet 5 mg  5 mg Oral Daily Jenne Campus, MD   5 mg at 11/04/16 0825  . citalopram (CELEXA) tablet 10 mg  10 mg Oral Daily Jenne Campus, MD   10 mg at 11/04/16 0826  . gabapentin (NEURONTIN) capsule 400 mg  400 mg Oral TID Jenne Campus, MD   400 mg at 11/04/16 1158  . hydrocortisone cream 0.5 %   Topical TID Kerrie Buffalo, NP      . ibuprofen (ADVIL,MOTRIN) tablet 400 mg  400 mg Oral Q12H PRN Sid Falcon, MD   400 mg at 11/04/16 1159  . insulin aspart (novoLOG) injection 0-15 Units  0-15 Units Subcutaneous TID WC Patrecia Pour, MD   3 Units at 11/03/16 1710  . insulin aspart (novoLOG) injection 0-5 Units  0-5 Units Subcutaneous QHS Patrecia Pour, MD   2 Units at 10/28/16 2054  . insulin aspart protamine- aspart (NOVOLOG MIX 70/30) injection 30 Units  30 Units Subcutaneous Q supper Patrecia Pour, MD   30 Units at 11/03/16 1713  . insulin aspart protamine- aspart (NOVOLOG MIX 70/30) injection 40 Units  40 Units Subcutaneous Q breakfast Patrecia Pour, MD   40 Units at 11/04/16 0827  . LORazepam (ATIVAN) tablet 1 mg  1 mg Oral Q8H PRN Jenne Campus, MD   1 mg at 11/04/16 1158  . magnesium hydroxide (MILK OF MAGNESIA) suspension 30 mL  30 mL Oral Daily PRN Shuvon B Rankin, NP      . metFORMIN (GLUCOPHAGE) tablet 850 mg  850 mg Oral BID WC Sid Falcon, MD   850 mg at  11/04/16 0825  . mirtazapine (REMERON) tablet 7.5 mg  7.5 mg Oral QHS Myer Peer Cobos, MD   7.5 mg at 11/03/16 2108  . nicotine (NICODERM CQ - dosed in mg/24 hours) patch 21 mg  21 mg Transdermal Daily Shuvon B Rankin, NP   21 mg at 11/04/16 0831  . ondansetron (ZOFRAN) tablet 4 mg  4 mg Oral Q8H PRN Shuvon B Rankin, NP      . pantoprazole (PROTONIX) EC tablet 40 mg  40 mg Oral Daily Shuvon B Rankin, NP   40 mg at 11/04/16 0825    Lab Results:  Results for orders placed or performed during the hospital encounter of 10/27/16 (from the past 48 hour(s))  Glucose, capillary     Status: Abnormal   Collection Time: 11/02/16  5:28 PM  Result Value Ref Range   Glucose-Capillary 203 (H) 65 - 99 mg/dL   Comment 1 Notify RN   Glucose, capillary     Status: Abnormal   Collection Time: 11/02/16  8:45 PM  Result Value Ref Range   Glucose-Capillary 172 (H) 65 - 99 mg/dL  Glucose, capillary     Status: Abnormal   Collection Time: 11/03/16  6:16 AM  Result Value Ref Range   Glucose-Capillary 129 (H) 65 - 99 mg/dL  Glucose, capillary     Status: Abnormal   Collection Time: 11/03/16 11:58 AM  Result Value Ref Range   Glucose-Capillary 122 (H) 65 - 99 mg/dL  Glucose, capillary     Status: Abnormal   Collection Time: 11/03/16  5:04 PM  Result Value Ref Range   Glucose-Capillary 196 (H) 65 - 99 mg/dL   Comment 1 Notify RN   Glucose, capillary     Status: Abnormal   Collection Time: 11/03/16  8:38 PM  Result Value Ref Range   Glucose-Capillary 127 (H) 65 - 99 mg/dL  Glucose, capillary     Status: Abnormal   Collection Time: 11/04/16  5:58 AM  Result Value Ref Range   Glucose-Capillary 131 (H) 65 - 99 mg/dL  Basic metabolic panel     Status: Abnormal   Collection Time: 11/04/16  6:16 AM  Result Value Ref Range   Sodium 137 135 - 145 mmol/L   Potassium 4.0 3.5 - 5.1 mmol/L   Chloride 102 101 - 111 mmol/L   CO2 27 22 - 32 mmol/L   Glucose, Bld 126 (H) 65 - 99 mg/dL   BUN 15 6 - 20 mg/dL    Creatinine, Ser 0.60 0.44 - 1.00 mg/dL   Calcium 9.1 8.9 - 10.3 mg/dL   GFR calc non Af Amer >60 >60 mL/min   GFR calc Af Amer >60 >60 mL/min    Comment: (NOTE) The eGFR has been calculated using the CKD EPI equation. This calculation has not been validated in all clinical situations. eGFR's persistently <60 mL/min signify possible Chronic Kidney Disease.    Anion gap 8 5 - 15    Comment: Performed at [facility], [place] [address] [place], [place] [address]  Glucose, capillary     Status: None   Collection Time: 11/04/16 11:54 AM  Result Value Ref Range   Glucose-Capillary 94 65 - 99 mg/dL    Blood Alcohol level:  Lab Results  Component Value Date   ETH <5 10/26/2016   ETH <11 91/63/8466    Metabolic Disorder Labs: Lab Results  Component Value Date   HGBA1C 12.0 (H) 10/29/2016   MPG 298 10/29/2016   MPG 278 (H) 12/06/2013   No results found for: PROLACTIN No results found for: CHOL, TRIG, HDL, CHOLHDL, VLDL, LDLCALC  Physical Findings: AIMS: Facial and Oral Movements Muscles of Facial Expression: None, normal Lips and Perioral Area: None, normal Jaw: None, normal Tongue: None, normal,Extremity Movements Upper (arms, wrists, hands, fingers): None, normal Lower (legs, knees, ankles, toes): None, normal, Trunk Movements Neck, shoulders, hips: None, normal, Overall Severity Severity of abnormal movements (highest score from questions above): None, normal Incapacitation due to  abnormal movements: None, normal Patient's awareness of abnormal movements (rate only patient's report): No Awareness, Dental Status Current problems with teeth and/or dentures?: No Does patient usually wear dentures?: No  CIWA:    COWS:     Musculoskeletal: Strength & Muscle Tone: within normal limits Gait & Station: normal Patient leans: N/A  Psychiatric Specialty Exam: Physical Exam  Nursing note and vitals reviewed.   ROS denies headache, no chest pain, no shortness  of breath, (+) nausea, no vomiting , peripheral edema ( bilateral)   Blood pressure (!) 130/99, pulse 97, temperature 98 F (36.7 C), temperature source Oral, resp. rate 18, height 5' 8.25" (1.734 m), weight 133.8 kg (295 lb).Body mass index is 44.53 kg/m.  General Appearance: gradually improving grooming   Eye Contact:  Good  Speech:  Normal Rate  Volume:  Normal  Mood: partially improved compared to admission  Affect: less constricted, smiles briefly at times    Thought Process:  Linear  Orientation:  Full (Time, Place, and Person)  Thought Content:  denies hallucinations, no delusions, not internally preoccupied Future oriented, at this time focusing on disposition plans   Suicidal Thoughts:  At this time denies suicidal or self injurious ideations, contracts for safety on unit   Homicidal Thoughts:  No denies any homicidal or violent ideations   Memory:  recent and remote grossly intact   Judgement:  Other:  improving   Insight:  improving   Psychomotor Activity:  Normal  Concentration:  Concentration: Good and Attention Span: Good  Recall:  Good  Fund of Knowledge:  Good  Language:  Good  Akathisia:  Negative  Handed:  Right  AIMS (if indicated):     Assets:  Desire for Improvement Resilience  ADL's:  Intact  Cognition:  WNL  Sleep:  Number of Hours: 5.75   Assessment -patient remains vaguely irritable, anxious, but improved compared to admission. States she is feeling better. No SI. At this time states she has decided to go live with her father, and help take care of him. She denies medication side effects, and thus far has tolerated Abilify trial well- no akathisia noted or reported . Follow up NA+ WNL- no longer hyponatremic.   Treatment Plan Summary: Daily contact with patient to assess and evaluate symptoms and progress in treatment, Medication management, Plan inpatient management  and medications as below Encourage ongoing group and milieu participation to work on  coping skills and symptom reduction Continue Celexa 10  mgrs QDAY for depression, anxiety Continue Remeron  7.5  mgrs QHS for depression, insomnia Continue Abilify 5 mgrs QDAY for mood disorder Continue Ativan   1 mgr Q 8 hours PRN for anxiety  Continue Neurontin  400  mgrs TID for anxiety, pain, peripheral neuropathy Continue DM management  Continue Pantoprazole for GERD  Treatment team working on disposition planning options    Neita Garnet, MD  11/04/2016, 4:58 PM   Patient ID: Sheran Spine, female   DOB: 30-Nov-1965, 51 y.o.   MRN: [id number]

## 2016-11-04 NOTE — Progress Notes (Signed)
Pt has been in the dayroom most of the evening coloring with other patients.  She reports he day has been better and that as of this evening, she is going to go stay with her father and take care of him.  She says that her main concern is taking care of her mother's affairs and belongings.  She says she has a lot to do when she is discharged, but she is ok with it and will continue to take her medication and reach out for help if she needs it.  Pt denies SI/HI/AVH.  She makes her needs known to staff.  Support and encouragement offered.  Discharge plans are in process.  Pt will probably discharge tomorrow to her father's home.  Safety maintained with q15 minute checks.

## 2016-11-04 NOTE — Progress Notes (Signed)
Adult Psychoeducational Group Note  Date:  11/04/2016 Time:  8:39 PM  Group Topic/Focus:  Wrap-Up Group:   The focus of this group is to help patients review their daily goal of treatment and discuss progress on daily workbooks.  Participation Level:  Active  Participation Quality:  Appropriate  Affect:  Appropriate  Cognitive:  Appropriate  Insight: Appropriate  Engagement in Group:  Engaged  Modes of Intervention:  Discussion  Additional Comments:  Pt stated she had an okay day. Pt stated her goal was to "gain an acceptance of things going on at home". Pt was encouraged to make her needs known to staff.  Caswell CorwinOwen, Elonzo Sopp C 11/04/2016, 8:39 PM

## 2016-11-04 NOTE — [facility] Group Notes (Signed)
[facility] 11/04/2016 1:15pm  Type of Therapy: Mental Health Association Presentation  Participation Level: Active  Participation Quality: Attentive  Affect: Appropriate  Cognitive: Oriented  Insight: Developing/Improving  Engagement in Therapy: Engaged  Modes of Intervention: Discussion, Education and Socialization  Summary of Progress/Problems: Mental Health Association (MHA) Speaker came to talk about his personal journey with substance abuse and addiction. The pt processed ways by which to relate to the speaker. MHA speaker provided handouts and educational information pertaining to groups and services offered by the [facility]. Pt was engaged in speaker's presentation and was receptive to resources provided.    Vernie ShanksLauren Rozanne Heumann, LCSW 11/04/2016 1:26 PM

## 2016-11-04 NOTE — Progress Notes (Signed)
DAR NOTE: Patient presents with anxious affect and depressed mood. Pt stated she had a poor sleep, good appetite, low energy, and poor concentration. Denies pain, auditory and visual hallucinations.  Rates depression at 7, hopelessness at 4, and anxiety at 10.  Maintained on routine safety checks.  Medications given as prescribed.  Support and encouragement offered as needed. States goal for today is "working on accepting my new living situation."  Patient observed socializing with peers in the dayroom.  Offered no complaint.

## 2016-11-05 LAB — GLUCOSE, CAPILLARY
Glucose-Capillary: 106 mg/dL — ABNORMAL HIGH (ref 65–99)
Glucose-Capillary: 116 mg/dL — ABNORMAL HIGH (ref 65–99)

## 2016-11-05 MED ORDER — NICOTINE 21 MG/24HR TD PT24: 21.0000 mg | MEDICATED_PATCH | Freq: Every day | TRANSDERMAL | 0 refills | Status: DC

## 2016-11-05 MED ORDER — METFORMIN HCL 850 MG PO TABS: 850.0000 mg | ORAL_TABLET | Freq: Two times a day (BID) | ORAL | 0 refills | Status: DC

## 2016-11-05 MED ORDER — HYDROCORTISONE 0.5 % EX CREA: TOPICAL_CREAM | Freq: Three times a day (TID) | CUTANEOUS | 0 refills | Status: DC

## 2016-11-05 MED ORDER — OMEPRAZOLE MAGNESIUM 20 MG PO TBEC: 20.0000 mg | DELAYED_RELEASE_TABLET | Freq: Every day | ORAL | Status: DC

## 2016-11-05 MED ORDER — IBUPROFEN 200 MG PO TABS
800.0000 mg | ORAL_TABLET | Freq: Four times a day (QID) | ORAL | 0 refills | Status: DC | PRN
Start: 2016-11-05 — End: 2016-12-29

## 2016-11-05 MED ORDER — CITALOPRAM HYDROBROMIDE 10 MG PO TABS: 10.0000 mg | ORAL_TABLET | Freq: Every day | ORAL | 0 refills | Status: DC

## 2016-11-05 MED ORDER — INSULIN ASPART PROT & ASPART (70-30 MIX) 100 UNIT/ML ~~LOC~~ SUSP: 40.0000 [IU] | Freq: Every day | SUBCUTANEOUS | 0 refills | Status: DC

## 2016-11-05 MED ORDER — MIRTAZAPINE 7.5 MG PO TABS: 7.5000 mg | ORAL_TABLET | Freq: Every day | ORAL | 0 refills | Status: DC

## 2016-11-05 MED ORDER — ARIPIPRAZOLE 5 MG PO TABS: 5.0000 mg | ORAL_TABLET | Freq: Every day | ORAL | 0 refills | Status: DC

## 2016-11-05 MED ORDER — GABAPENTIN 400 MG PO CAPS: 400.0000 mg | ORAL_CAPSULE | Freq: Three times a day (TID) | ORAL | 0 refills | Status: DC

## 2016-11-05 MED ORDER — INSULIN ASPART PROT & ASPART (70-30 MIX) 100 UNIT/ML ~~LOC~~ SUSP: 30.0000 [IU] | Freq: Every day | SUBCUTANEOUS | 0 refills | Status: DC

## 2016-11-05 MED ORDER — LORAZEPAM 1 MG PO TABS: 1.0000 mg | ORAL_TABLET | Freq: Three times a day (TID) | ORAL | 0 refills | Status: DC | PRN

## 2016-11-05 NOTE — Progress Notes (Signed)
Nursing Discharge Note 08/20/2016 2248-2500  Data Reports sleeping good with PRN sleep med.  Rated depression 7/10, hopelessness 4/10, and anxiety 10/10 on self-inventory. Affect depressed mood "anxious."  Denies HI, SI, AVH.  Attending groups, spending more free time in day area, interacting minimally but appropriate with staff and peers.  Denies physical complaints, complains of anxiety which she says is baselin  Received discharge orders.  Action Spoke with patient 1:1, nurse offered support to patient throughout shift.  Given PRN Ativan for anxiety this AM.  Reviewed medications, discharge instructions, and follow up appointments with patient. Medication samples and scripts handed to patient.  Paperwork, AVS, SRA, and transition record handed to patient.   Escorted off of unit at 1515. Belongings returned per belongings form.  Discharged to lobby where she was met by taxi (had taxi cab voucher in hand).    Response Verbalized understanding of discharge instructions.  Ativan effective.  Agrees to contact someone or 911 with thoughts/intent to harm self or others.    To follow up per AVS.

## 2016-11-05 NOTE — Tx Team (Signed)
Interdisciplinary Treatment and Diagnostic Plan Update  11/05/2016 Time of Session: 12:05 PM  Claudia Erickson MRN: [id number]  Principal Diagnosis: MDD, no psychotic features   Secondary Diagnoses: Active Problems:   Diabetes mellitus (HCC)   MDD (major depressive disorder), recurrent severe, without psychosis (HCC)   Chronic venous insufficiency   Hyponatremia   Current Medications:  Current Facility-Administered Medications  Medication Dose Route Frequency Provider Last Rate Last Dose  . acetaminophen (TYLENOL) tablet 650 mg  650 mg Oral Q6H PRN Shuvon B Rankin, NP   650 mg at 10/30/16 1345  . alum & mag hydroxide-simeth (MAALOX/MYLANTA) 200-200-20 MG/5ML suspension 30 mL  30 mL Oral Q4H PRN Shuvon B Rankin, NP      . ARIPiprazole (ABILIFY) tablet 5 mg  5 mg Oral Daily Craige CottaFernando A Cobos, MD   5 mg at 11/05/16 0826  . citalopram (CELEXA) tablet 10 mg  10 mg Oral Daily Craige CottaFernando A Cobos, MD   10 mg at 11/05/16 0827  . gabapentin (NEURONTIN) capsule 400 mg  400 mg Oral TID Craige CottaFernando A Cobos, MD   400 mg at 11/05/16 0827  . hydrocortisone cream 0.5 %   Topical TID Adonis BrookSheila Agustin, NP      . ibuprofen (ADVIL,MOTRIN) tablet 400 mg  400 mg Oral Q12H PRN Inez CatalinaEmily B Mullen, MD   400 mg at 11/04/16 1159  . insulin aspart (novoLOG) injection 0-15 Units  0-15 Units Subcutaneous TID WC Tyrone Nineyan B Grunz, MD   8 Units at 11/04/16 1735  . insulin aspart (novoLOG) injection 0-5 Units  0-5 Units Subcutaneous QHS Tyrone Nineyan B Grunz, MD   2 Units at 10/28/16 2054  . insulin aspart protamine- aspart (NOVOLOG MIX 70/30) injection 30 Units  30 Units Subcutaneous Q supper Tyrone Nineyan B Grunz, MD   30 Units at 11/04/16 1736  . insulin aspart protamine- aspart (NOVOLOG MIX 70/30) injection 40 Units  40 Units Subcutaneous Q breakfast Tyrone Nineyan B Grunz, MD   40 Units at 11/05/16 [phone]  . LORazepam (ATIVAN) tablet 1 mg  1 mg Oral Q8H PRN Craige CottaFernando A Cobos, MD   1 mg at 11/05/16 0826  . magnesium hydroxide (MILK OF MAGNESIA) suspension 30 mL  30  mL Oral Daily PRN Shuvon B Rankin, NP      . metFORMIN (GLUCOPHAGE) tablet 850 mg  850 mg Oral BID WC Inez CatalinaEmily B Mullen, MD   850 mg at 11/05/16 0826  . mirtazapine (REMERON) tablet 7.5 mg  7.5 mg Oral QHS Rockey SituFernando A Cobos, MD   7.5 mg at 11/04/16 2112  . nicotine (NICODERM CQ - dosed in mg/24 hours) patch 21 mg  21 mg Transdermal Daily Shuvon B Rankin, NP   21 mg at 11/04/16 0831  . ondansetron (ZOFRAN) tablet 4 mg  4 mg Oral Q8H PRN Shuvon B Rankin, NP      . pantoprazole (PROTONIX) EC tablet 40 mg  40 mg Oral Daily Shuvon B Rankin, NP   40 mg at 11/05/16 0827    PTA Medications: Prescriptions Prior to Admission  Medication Sig Dispense Refill Last Dose  . gabapentin (NEURONTIN) 300 MG capsule Take 1 capsule (300 mg total) by mouth 3 (three) times daily. (Patient taking differently: Take 600 mg by mouth 3 (three) times daily. ) 90 capsule 3 10/25/2016 at Unknown time  . traZODone (DESYREL) 100 MG tablet Take 150 mg by mouth at bedtime.   10/25/2016 at Unknown time  . [DISCONTINUED] ibuprofen (ADVIL,MOTRIN) 200 MG tablet Take 800 mg by mouth every  6 (six) hours as needed.   10/25/2016 at Unknown time  . [DISCONTINUED] omeprazole (PRILOSEC OTC) 20 MG tablet Take 20 mg by mouth daily.   10/25/2016 at Unknown time  . citalopram (CELEXA) 20 MG tablet Take 1 tablet (20 mg total) by mouth daily. (Patient not taking: Reported on 10/26/2016) 30 tablet 11 Not Taking at Unknown time  . metFORMIN (GLUCOPHAGE) 500 MG tablet Take 1 tablet (500 mg total) by mouth 2 (two) times daily with a meal. (Patient not taking: Reported on 10/26/2016) 60 tablet 11 Not Taking at Unknown time  . sulfamethoxazole-trimethoprim (BACTRIM DS,SEPTRA DS) 800-160 MG per tablet Take 1 tablet by mouth 2 (two) times daily. (Patient not taking: Reported on 10/26/2016) 6 tablet 0 Not Taking at Unknown time    Treatment Modalities: Medication Management, Group therapy, Case management,  1 to 1 session with clinician, Psychoeducation,  Recreational therapy.  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  Physician Treatment Plan for Primary Diagnosis: MDD, no psychotic features  Long Term Goal(s): Improvement in symptoms so as ready for discharge  Short Term Goals: Ability to verbalize feelings will improve Ability to disclose and discuss suicidal ideas Ability to demonstrate self-control will improve Ability to identify and develop effective coping behaviors will improve Ability to maintain clinical measurements within normal limits will improve Ability to identify triggers associated with substance abuse/mental health issues will improve  Medication Management: Evaluate patient's response, side effects, and tolerance of medication regimen.  Therapeutic Interventions: 1 to 1 sessions, Unit Group sessions and Medication administration.  Evaluation of Outcomes: Adequate for Discharge  Physician Treatment Plan for Secondary Diagnosis: Active Problems:   Diabetes mellitus (HCC)   MDD (major depressive disorder), recurrent severe, without psychosis (HCC)   Chronic venous insufficiency   Hyponatremia   Long Term Goal(s): Improvement in symptoms so as ready for discharge  Short Term Goals: Ability to verbalize feelings will improve Ability to disclose and discuss suicidal ideas Ability to demonstrate self-control will improve Ability to identify and develop effective coping behaviors will improve Ability to maintain clinical measurements within normal limits will improve Ability to identify triggers associated with substance abuse/mental health issues will improve  Medication Management: Evaluate patient's response, side effects, and tolerance of medication regimen.  Therapeutic Interventions: 1 to 1  sessions, Unit Group sessions and Medication administration.  Evaluation of Outcomes: Adequate for Discharge   RN Treatment Plan for Primary Diagnosis: MDD, no psychotic features  Long Term Goal(s): Knowledge of disease and therapeutic regimen to maintain health will improve  Short Term Goals: Ability to verbalize feelings will improve, Ability to disclose and discuss suicidal ideas and Ability to identify and develop effective coping behaviors will improve  Medication Management: RN will administer medications as ordered by provider, will assess and evaluate patient's response and provide education to patient for prescribed medication. RN will report any adverse and/or side effects to prescribing provider.  Therapeutic Interventions: 1 on 1 counseling sessions, Psychoeducation, Medication administration, Evaluate responses to treatment, Monitor vital signs and CBGs as ordered, Perform/monitor CIWA, COWS, AIMS and Fall Risk screenings as ordered, Perform wound care treatments as ordered.  Evaluation of Outcomes: Adequate for Discharge   LCSW Treatment Plan for Primary Diagnosis: MDD, no psychotic features  Long Term Goal(s): Safe transition to appropriate next level of care at discharge, Engage patient in therapeutic group addressing interpersonal concerns.  Short Term Goals: Engage patient in aftercare planning with referrals and resources, Identify triggers associated with mental health/substance abuse issues and Increase skills for wellness and recovery  Therapeutic Interventions: Assess for all discharge needs, 1 to 1 time with Social worker, Explore available resources and support systems, Assess for adequacy in community support network, Educate family and significant other(s) on suicide prevention, Complete Psychosocial Assessment, Interpersonal group therapy.  Evaluation of Outcomes: Adequate for Discharge   Progress in Treatment: Attending groups: Yes Participating in groups:  Yes Taking medication as prescribed: Yes, MD continues to assess for medication changes as needed Toleration medication: Yes, no side effects reported at this time Family/Significant other contact made: No, Pt declines Patient understands diagnosis: Yes AEB willingness to participate in treatment Discussing patient identified problems/goals with staff: Yes Medical problems stabilized or resolved: Yes Denies suicidal/homicidal ideation: Yes Issues/concerns per patient self-inventory: None Other: N/A  New problem(s) identified: None identified at this time.   New Short Term/Long Term Goal(s): None identified at this time.   Discharge Plan or Barriers: Pt will return home and follow-up with outpatient services.   Reason for Continuation of Hospitalization: None identified at this time.   Estimated Length of Stay: 0 days  Attendees: Patient: 11/05/2016  12:05 PM  Physician: Dr. Jama Flavors 11/05/2016  12:05 PM  Nursing: Harlin Heys, RN 11/05/2016  12:05 PM  RN Care Manager: Onnie Boer, RN 11/05/2016  12:05 PM  Social Worker: Vernie Shanks, LCSW; Heather Smart, LCSW 11/05/2016  12:05 PM  Recreational Therapist:  11/05/2016  12:05 PM  Other: Armandina Stammer, NP; Gray Bernhardt, NP 11/05/2016  12:05 PM  Other:  11/05/2016  12:05 PM  Other: 11/05/2016  12:05 PM    Scribe for Treatment Team: Verdene Lennert, LCSW 11/05/2016 12:05 PM

## 2016-11-05 NOTE — Progress Notes (Signed)
Inpatient Diabetes Program Recommendations  AACE/ADA: New Consensus Statement on Inpatient Glycemic Control (2015)  Target Ranges:  Prepandial:   less than 140 mg/dL      Peak postprandial:   less than 180 mg/dL (1-2 hours)      Critically ill patients:  140 - 180 mg/dL   Lab Results  Component Value Date   GLUCAP 116 (H) 11/05/2016   HGBA1C 12.0 (H) 10/29/2016    Review of Glycemic Control  Diabetes history: DM2 Outpatient Diabetes medications: 70/30 40 units in am and 30 units in pm Current orders for Inpatient glycemic control: Same as above + Novolog 0-15 units tidwc and hs, metformin 850 mg bid  Poor glycemic control prior to admission. Will need f/u with PCP for glucose control.  Inpatient Diabetes Program Recommendations:    Agree with orders.  Will follow while inpatient. Thank you. Claudia Erickson, RD, LDN, CDE Inpatient Diabetes Coordinator [phone]

## 2016-11-05 NOTE — Progress Notes (Signed)
Recreation Therapy Notes  Date:11/05/16 Time:0930 Location: [address] Dayroom  Group Topic: Stress Management  Goal Area(s) Addresses:  Patient will verbalize importance of using healthy stress management.  Patient will identify positive emotions associated with healthy stress management.   Intervention: Stress Management  Activity: Guided Imagery. LRT introduced the stress management techniques of guided imagery. LRT read a script for patients to engage in the technique. Patients were to follow along as LRT read the script.  Education:Stress Management, Discharge Planning.   Education Outcome:Acknowledges edcuation/In group clarification offered/Needs additional education  Clinical Observations/Feedback:Pt did not attend group.    Jenavee Laguardia, LRT/CTRS         Shiesha Jahn A 11/05/2016 12:55 PM 

## 2016-11-05 NOTE — Progress Notes (Cosign Needed)
Adult Psychoeducational Group Note  Date:  11/05/2016 Time:  1:07 PM  Group Topic/Focus:  Goals Group:   The focus of this group is to help patients establish daily goals to achieve during treatment and discuss how the patient can incorporate goal setting into their daily lives to aide in recovery.  Participation Level:  Active  Participation Quality:  Appropriate  Affect:  Appropriate  Cognitive:  Appropriate  Insight: Appropriate  Engagement in Group:  Engaged  Modes of Intervention:  Discussion  Additional Comments:  Pt stated she is doing fine.  Pt stated she will not get off her medications in order to prevent a relapse.  [name], [name] D 11/05/2016, 1:07 PM
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
## 2016-11-05 NOTE — Progress Notes (Signed)
  [facility] :  Will you be returning to the same living situation after discharge:  Yes,  temporarily before moving in with her father At discharge, do you have transportation home?: Yes,  Pt provided with taxi voucher Do you have the ability to pay for your medications: Yes,  Pt provided with samples and prescriptions  Release of information consent forms completed and in the chart;  Patient's signature needed at discharge.  Patient to Follow up at: Follow-up Information    [facility] Follow up.   Specialty:  Professional Counselor Why:  Please go within 1-3 days of discharge in order to be assessed for medication management services. Walk-in hours are from 8:30am-12:00pm Monday-Friday Contact information: [address] [place] [address] [phone]        [facility] Follow up on 11/08/2016.   Why:  at 10:00am with Aleima for therapy. Contact information: [facility] [address]  [address]  [place], [place] [address] PHONE: [phone] FAX: [phone]          Next level of care provider has access to [facility] Link:no  Safety Planning and Suicide Prevention discussed: Yes,  with Pt; declines family contact  Have you used any form of tobacco in the last 30 days? (Cigarettes, Smokeless Tobacco, Cigars, and/or Pipes): Yes  Has patient been referred to the Quitline?: Patient refused referral  Patient has been referred for addiction treatment: Yes  Verdene LennertLauren C Shirley Decamp 11/05/2016, 12:06 PM

## 2016-11-05 NOTE — [facility] Suicide Risk Assessment (Signed)
[facility]   Principal Problem: Depression  Discharge Diagnoses:  Patient Active Problem List   Diagnosis Date Noted  . Chronic venous insufficiency [I87.2] 10/28/2016  . Hyponatremia [E87.1] 10/28/2016  . Hyperglycemia [R73.9]   . Intentional drug overdose (HCC) [T50.902A]   . Suicidal ideation [R45.851]   . Urinary frequency [R35.0] 04/08/2015  . Anxiety and depression [F41.8] 12/26/2014  . Insomnia [G47.00] 12/26/2014  . MDD (major depressive disorder), recurrent severe, without psychosis (HCC) [F33.2] 08/27/2014  . Depressive disorder [F32.9] 07/28/2014  . Aspiration into airway [T17.908A] 12/06/2013  . Aspiration pneumonia (HCC) [J69.0] 12/06/2013  . Polysubstance abuse [F19.10] 12/06/2013  . Diabetes mellitus (HCC) [E11.9] 12/06/2013  . Syncope [R55] 12/06/2013  . Leucocytosis [D72.829] 12/06/2013  . Acute respiratory failure (HCC) [J96.00] 12/06/2013  . Elevated LFTs [R79.89] 12/06/2013    Total Time spent with patient: 30 minutes  Musculoskeletal: Strength & Muscle Tone: within normal limits Gait & Station: normal Patient leans: N/A  Psychiatric Specialty Exam: ROS describes headaches, no chest pain, no shortness of breath, no nausea, no vomiting , (+) lose stools, no fever, no chills    Blood pressure 119/87, pulse (!) 103, temperature 98.2 F (36.8 C), temperature source Oral, resp. rate 18, height 5' 8.25" (1.734 m), weight 133.8 kg (295 lb).Body mass index is 44.53 kg/m.  General Appearance: Fairly Groomed  Patent attorneyye Contact::  improved   Speech:  Normal Rate409  Volume:  Normal  Mood:  improved mood, states " I feel better than when I came in"  Affect:  more reactive, less irritable  Thought Process:  Linear  Orientation:  Full (Time, Place, and Person)  Thought Content:  no hallucinations, no delusions, not internally preoccupied   Suicidal Thoughts:  No denies any suicidal or self injurious ideations, no homicidal or violent  ideations   Homicidal Thoughts:  No  Memory:  recent and remote grossly intact   Judgement:  Other:  improving   Insight:  improving   Psychomotor Activity:  Normal  Concentration:  Good  Recall:  Good  Fund of Knowledge:Good  Language: Good  Akathisia:  Negative  Handed:  Right  AIMS (if indicated):   no abnormal or involuntary movements noted or reported   Assets:  Desire for Improvement Resilience  Sleep:  Number of Hours: 5.5  Cognition: WNL  ADL's:  Intact   Mental Status Per Nursing Assessment::   On Admission:     Demographic Factors:  51 year old divorced female , 2 sons, plans to go live with her elderly father   Loss Factors: Recent death of her mother, 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 No thought disorder, no suicidal ideations, no homicidal ideations, no hallucinations, no delusions, not internally preoccupied. Denies medication side effects. No disruptive or agitated behaviors on unit.  Cognitive Features That Contribute To Risk:  No gross cognitive deficits noted upon discharge. Is alert , attentive, and oriented x 3    Suicide Risk:  Mild:  Suicidal ideation of limited frequency, intensity, duration, and specificity.  There are  no identifiable plans, no associated intent, mild dysphoria and related symptoms, good self-control (both objective and subjective assessment), few other risk factors, and identifiable protective factors, including available and accessible social  support.  Follow-up Information    [facility] Follow up.   Specialty:  Professional Counselor Why:  Please go within 1-3 days of discharge in order to be assessed for medication management services. Walk-in hours are from 8:30am-12:00pm Monday-Friday Contact information: [address] [place] [address] [phone]        [facility] Follow up on 11/08/2016.   Why:  at 10:00am with Aleima for therapy. Contact information: [facility] [address]  [address]  [place], [place] [address] PHONE: [phone] FAX: [phone]          Plan Of Care/Follow-up recommendations:  Activity:  as tolerated  Diet:  Heart Healthy, Diabetic DIet  Tests:  NA Other:  See below Patient is leaving unit in good spirits  Plans to return home Plans to follow up with [facility] [place] for medical  Follow ups and treatment .  Nehemiah Massed, MD 11/05/2016, 2:17 PM

## 2016-12-29 ENCOUNTER — Ambulatory Visit (INDEPENDENT_AMBULATORY_CARE_PROVIDER_SITE_OTHER): Payer: Self-pay | Admitting: Internal Medicine

## 2016-12-29 ENCOUNTER — Encounter: Payer: Self-pay | Admitting: Internal Medicine

## 2016-12-29 ENCOUNTER — Ambulatory Visit: Payer: Self-pay | Admitting: Internal Medicine

## 2016-12-29 VITALS — BP 136/94 | HR 78 | Resp 14 | Ht 68.0 in | Wt 300.0 lb

## 2016-12-29 DIAGNOSIS — K029 Dental caries, unspecified: Secondary | ICD-10-CM

## 2016-12-29 DIAGNOSIS — G4700 Insomnia, unspecified: Secondary | ICD-10-CM

## 2016-12-29 DIAGNOSIS — Z794 Long term (current) use of insulin: Secondary | ICD-10-CM

## 2016-12-29 DIAGNOSIS — F418 Other specified anxiety disorders: Secondary | ICD-10-CM

## 2016-12-29 DIAGNOSIS — F329 Major depressive disorder, single episode, unspecified: Secondary | ICD-10-CM

## 2016-12-29 DIAGNOSIS — F332 Major depressive disorder, recurrent severe without psychotic features: Secondary | ICD-10-CM

## 2016-12-29 DIAGNOSIS — E118 Type 2 diabetes mellitus with unspecified complications: Secondary | ICD-10-CM

## 2016-12-29 DIAGNOSIS — F410 Panic disorder [episodic paroxysmal anxiety] without agoraphobia: Secondary | ICD-10-CM

## 2016-12-29 DIAGNOSIS — F419 Anxiety disorder, unspecified: Secondary | ICD-10-CM

## 2016-12-29 LAB — GLUCOSE, POCT (MANUAL RESULT ENTRY): POC GLUCOSE: 230 mg/dL — AB (ref 70–99)

## 2016-12-29 MED ORDER — GABAPENTIN 100 MG PO CAPS: ORAL_CAPSULE | ORAL | 11 refills | Status: DC

## 2016-12-29 MED ORDER — LORAZEPAM 1 MG PO TABS: 1.0000 mg | ORAL_TABLET | Freq: Three times a day (TID) | ORAL | 0 refills | Status: DC | PRN

## 2016-12-29 MED ORDER — ARIPIPRAZOLE 5 MG PO TABS: 5.0000 mg | ORAL_TABLET | Freq: Every day | ORAL | 11 refills | Status: DC

## 2016-12-29 MED ORDER — OMEPRAZOLE MAGNESIUM 20 MG PO TBEC: 20.0000 mg | DELAYED_RELEASE_TABLET | Freq: Every day | ORAL | 11 refills | Status: DC

## 2016-12-29 MED ORDER — OMEPRAZOLE MAGNESIUM 20 MG PO TBEC
20.0000 mg | DELAYED_RELEASE_TABLET | Freq: Every day | ORAL | 11 refills | Status: DC
Start: 2016-12-29 — End: 2016-12-29

## 2016-12-29 MED ORDER — INSULIN ASPART PROT & ASPART (70-30 MIX) 100 UNIT/ML ~~LOC~~ SUSP: 30.0000 [IU] | Freq: Every day | SUBCUTANEOUS | 11 refills | Status: DC

## 2016-12-29 MED ORDER — INSULIN ASPART PROT & ASPART (70-30 MIX) 100 UNIT/ML ~~LOC~~ SUSP: 40.0000 [IU] | Freq: Every day | SUBCUTANEOUS | 11 refills | Status: DC

## 2016-12-29 MED ORDER — METFORMIN HCL 850 MG PO TABS: 850.0000 mg | ORAL_TABLET | Freq: Two times a day (BID) | ORAL | 11 refills | Status: DC

## 2016-12-29 MED ORDER — MIRTAZAPINE 7.5 MG PO TABS: 7.5000 mg | ORAL_TABLET | Freq: Every day | ORAL | 11 refills | Status: DC

## 2016-12-29 MED ORDER — MELOXICAM 15 MG PO TABS: 15.0000 mg | ORAL_TABLET | Freq: Every day | ORAL | 11 refills | Status: DC

## 2016-12-29 MED ORDER — CITALOPRAM HYDROBROMIDE 10 MG PO TABS: 10.0000 mg | ORAL_TABLET | Freq: Every day | ORAL | 11 refills | Status: DC

## 2016-12-29 NOTE — Patient Instructions (Signed)
Call if you are having difficulties obtaining meds

## 2016-12-29 NOTE — Progress Notes (Signed)
Subjective:    Patient ID: Claudia Erickson, female    DOB: 06/20/1966, 51 y.o.   MRN: [id number]  HPI   Here to establish  1.  Severe Major Depression and Anxiety:  Chronic issue for her for over 20 years.  Had suicide attempt with OD of Trazodone in January followed by 2 week [facility].   Was given Aripiprazole 5 mg daily for mood stability, Citalopram 10 mg daily for depression, Mirtazapine 7.5 mg at bedtime for sleep, Lorazepam 1 mg every 8 hours as needed for sleep.   Has been out of all meds above for 2.5 weeks.   Felt she did very well on this combination.   Each day off meds, she has been feeling worse with regards to her anxiety and depression.  Anxiety is most prominent.  No suicidal ideation. She is taking care of her father and moved with him recently.  He has alzheimers.   Previously, took care of her bedridden mother for 10 years, who died 06/2016. Patient was previously a Advertising account plannernail technician for 25 years before moving to [place] to take care of her mother.  The two of them moved back here 2014.  2.  DM: diagnosed 6 years ago.  Living in [place].  Was treated initially with oral medications.  Insulin initiated about 6 months after diagnosis and not well controlled on just the oral meds.   Metformin 850 mg twice daily Novolog 70/30 40 units in am, 30 units in pm Last A1C was 12.0 % Does have symptomatic peripheral neuropathy for which she takes Gabapentin.   Does not check feet daily, but does check most days.  Does not really do foot care. Not sure if has retinopathy.  Last eye check was 1 year ago.   Her father has been her optometrist--retired now.  Her last visit, however, was with another eye doctor.  No known heart disease.   Immunization History  Administered Date(s) Administered  . Influenza,inj,Quad PF,36+ Mos 12/07/2013, 10/28/2016  . Pneumococcal Polysaccharide-23 12/07/2013, 10/28/2016  . Tdap 02/23/2015   3.  History of hyperlipidemia:   Cannot recall medication for this, but does take Fish oil 1000 mg once daily.  4.  Pain in knees and shoulders as bilateral carpal tunnel syndrome, left greater than right.  Uses Meloxicam for this.  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  5.  Elevated BP:  Has never been diagnosed with hypertension.  Incredibly anxious today.   Current Outpatient Prescriptions:  .  insulin aspart protamine- aspart (NOVOLOG MIX 70/30) (70-30) 100 UNIT/ML injection, Inject 0.3 mLs (30 Units total) into the skin daily with supper. For diabetes management, Disp: 10 mL, Rfl: 0 .  insulin aspart protamine- aspart (NOVOLOG MIX 70/30) (70-30) 100 UNIT/ML injection, Inject 0.4 mLs (40 Units total) into the skin daily with breakfast. For diabetes management, Disp: 10 mL, Rfl: 0 .  ARIPiprazole (ABILIFY) 5 MG tablet, Take 1 tablet (5 mg total) by mouth daily. For mood stability (Patient not taking: Reported on 12/29/2016), Disp: 30 tablet, Rfl: 0 .  citalopram (CELEXA) 10 MG tablet, Take 1 tablet (10 mg total) by mouth daily. For depression (Patient not taking: Reported on 12/29/2016), Disp: 30 tablet, Rfl: 0 .  gabapentin (NEURONTIN) 400 MG capsule, Take 1 capsule (400 mg total) by mouth 3 (three) times daily. For agitation/diabetic neuropathy (Patient not taking: Reported on 12/29/2016), Disp: 90 capsule, Rfl: 0 .  hydrocortisone cream 0.5 %, Apply topically 3 (three)  times daily. For itching (Patient not taking: Reported on 12/29/2016), Disp: 30 g, Rfl: 0 .  ibuprofen (ADVIL,MOTRIN) 200 MG tablet, Take 4 tablets (800 mg total) by mouth every 6 (six) hours as needed. For moderate pain (Patient not taking: Reported on 12/29/2016), Disp: 1 tablet, Rfl: 0 .  LORazepam (ATIVAN) 1 MG tablet, Take 1 tablet (1 mg total) by mouth every 8 (eight) hours as needed for anxiety. (Patient not taking: Reported on 12/29/2016), Disp: 12 tablet, Rfl: 0 .  metFORMIN (GLUCOPHAGE) 850 MG tablet, Take 1 tablet (850 mg total) by mouth 2 (two)  times daily with a meal. For diabetes management (Patient not taking: Reported on 12/29/2016), Disp: 30 tablet, Rfl: 0 .  mirtazapine (REMERON) 7.5 MG tablet, Take 1 tablet (7.5 mg total) by mouth at bedtime. For insomnia (Patient not taking: Reported on 12/29/2016), Disp: 30 tablet, Rfl: 0 .  nicotine (NICODERM CQ - DOSED IN MG/24 HOURS) 21 mg/24hr patch, Place 1 patch (21 mg total) onto the skin daily. For smoking cessation (Patient not taking: Reported on 12/29/2016), Disp: 28 patch, Rfl: 0 .  omeprazole (PRILOSEC OTC) 20 MG tablet, Take 1 tablet (20 mg total) by mouth daily. For acid reflux (Patient not taking: Reported on 12/29/2016), Disp: , Rfl:    No Known Allergies   Past Medical History:  Diagnosis Date  . Anxiety and depression   . Chronic back pain   . Diabetes mellitus without complication (HCC)   . GERD (gastroesophageal reflux disease)   . Hypertension   . Panic attack 1990s  . Peripheral neuropathy (HCC)   . Positive TB test   . Substance abuse   . Substance induced mood disorder (HCC)   . UTI (lower urinary tract infection)    Past Surgical History:  Procedure Laterality Date  . ABDOMINAL HYSTERECTOMY    . CHOLECYSTECTOMY    . ROTATOR CUFF REPAIR    . TONSILLECTOMY      Social History   Social History  . Marital status: Single    Spouse name: N/A  . Number of children: 2  . Years of education: 14   Occupational History  . 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 Smoking status: Current Every Day Smoker    Packs/day: 0.50    Years: 30.00    Types: Cigarettes  . Smokeless tobacco: Never Used  . Alcohol use No  . Drug use: Yes    Types: Cocaine     Comment: Heroine--only used briefly before.  Marland Kitchen Sexual activity: Not on file   Other Topics Concern  . Not on file   Social History Narrative   Born and raised in [place], [place].   Fun: Doesn't do a thing - never leaves the house.   Denies religious  beliefs affecting health care.    Recently moved in with father for caregiver purposes   Not in touch with older son.   Younger son incarcerated for drug related reasons.    Family History  Problem Relation Age of Onset  . Diabetes Mellitus II Father   . Hyperlipidemia Father   . Diabetes Father   . Colon cancer Father 21    not sure of age he was diagnosed  . Arthritis Mother   . Hypertension Mother   . Heart disease Mother   . Drug abuse Son     incarcerated with drug issues  . CAD Neg Hx  Review of Systems     Objective:   Physical Exam   NAD Morbidly obese HEENT:  PERRL, EOMI, TMs pearly gray, throat without injection.  Right upper molar caried and broken off at gum line. Neck:  Supple, No adenopathy, no thyromegaly Chest:  CTA CV:  RRR with normal S1 and S2, No S3, S4 or murmur, radial and DP pulses normal and equal. Abd:  S, NT, No HSM or masses, + BS Legs obese at ankles with possible mild edema        Assessment & Plan:  1.  Major Depression with anxiety:  Trying to get her set back up with meds.  Call from [facility] with information that all available there that were sent.  Will need to obtain Lorazepam from another pharmacy as discussed with her.  2  DM:  Discussed that Mirtazapine can cause increased appetite and thus weight gain.  If cannot keep appetite under control and diabetic control worsens because of this, may need to consider medication change. Restart meds and follow up in 1 month Optometry referral for diabetic eye exam.  3.  Elevated BP:  Did find in history a diagnosis of Hypertension in the past.  Will reassess when she follows up in 1 month on all her medication.  4.  Joint complaints:  Refill Meloxicam

## 2016-12-29 NOTE — Progress Notes (Signed)
Social Work Tax inspectorntern completed new patient screener with [employer]. Jill Sidelison mentioned many depressive and anxious symptoms. She mentioned that she had a suicide attempt in January in which she attempted by overdose. Although she was very anxious and mentions being anxious often, she mentioned only tow stressors. Her main stressors were taking care of her father who has alzheimers  and financial burden. She mentioned that she currently has a therapist but is still very interested in counseling. SWI completed a grounding exercise with Revonda [employer]ison at the end of screening. Kathryne Hitchatosha will follow up with Revonda StandardAllison to schedule an appointment.

## 2016-12-30 ENCOUNTER — Telehealth: Payer: Self-pay

## 2016-12-30 NOTE — Telephone Encounter (Signed)
[facility] and patient is able to get Omeprazole and Gabapentin. Novolog and Abilify on hold. They will give patient samples of Novolog and Abilify until patient gets set up with MAP program. Patient informed and verbalized understanding.

## 2016-12-30 NOTE — Telephone Encounter (Signed)
Patient called stating she did not pick up her Rx's from [facility] health department yesterday but she is going today. States she did get the 4 Rx's from [facility] and the citalopram was $18. She just wanted us to be aware of this. Patient was confused by what Dr. Delrae AlfredMulberry meant by having to go to 3 visits at the health department. Patient was informed to go to health department pharmacy and they will explain the process for the MAP program.

## 2017-01-10 ENCOUNTER — Other Ambulatory Visit: Payer: Self-pay | Admitting: Licensed Clinical Social Worker

## 2017-01-17 ENCOUNTER — Other Ambulatory Visit: Payer: Self-pay | Admitting: Internal Medicine

## 2017-01-21 NOTE — Telephone Encounter (Signed)
To Dr. Mulberry for approval 

## 2017-01-24 ENCOUNTER — Other Ambulatory Visit: Payer: Self-pay

## 2017-01-24 MED ORDER — LORAZEPAM 1 MG PO TABS: 1.0000 mg | ORAL_TABLET | Freq: Three times a day (TID) | ORAL | 0 refills | Status: DC | PRN

## 2017-01-24 NOTE — Telephone Encounter (Signed)
To Dr. Mulberry for approval 

## 2017-02-02 ENCOUNTER — Encounter: Payer: Self-pay | Admitting: Internal Medicine

## 2017-02-02 ENCOUNTER — Ambulatory Visit (INDEPENDENT_AMBULATORY_CARE_PROVIDER_SITE_OTHER): Payer: Self-pay | Admitting: Internal Medicine

## 2017-02-02 VITALS — BP 124/82 | HR 90 | Temp 98.2°F | Resp 14 | Ht 68.0 in | Wt 298.0 lb

## 2017-02-02 DIAGNOSIS — Z794 Long term (current) use of insulin: Secondary | ICD-10-CM

## 2017-02-02 DIAGNOSIS — Z720 Tobacco use: Secondary | ICD-10-CM | POA: Insufficient documentation

## 2017-02-02 DIAGNOSIS — J0140 Acute pansinusitis, unspecified: Secondary | ICD-10-CM

## 2017-02-02 DIAGNOSIS — I872 Venous insufficiency (chronic) (peripheral): Secondary | ICD-10-CM

## 2017-02-02 DIAGNOSIS — M25562 Pain in left knee: Secondary | ICD-10-CM

## 2017-02-02 DIAGNOSIS — M25561 Pain in right knee: Secondary | ICD-10-CM

## 2017-02-02 DIAGNOSIS — F332 Major depressive disorder, recurrent severe without psychotic features: Secondary | ICD-10-CM

## 2017-02-02 DIAGNOSIS — E118 Type 2 diabetes mellitus with unspecified complications: Secondary | ICD-10-CM

## 2017-02-02 DIAGNOSIS — G8929 Other chronic pain: Secondary | ICD-10-CM

## 2017-02-02 LAB — GLUCOSE, POCT (MANUAL RESULT ENTRY): POC Glucose: 236 mg/dl — AB (ref 70–99)

## 2017-02-02 MED ORDER — CITALOPRAM HYDROBROMIDE 20 MG PO TABS: ORAL_TABLET | ORAL | 11 refills | Status: DC

## 2017-02-02 MED ORDER — HYDROCHLOROTHIAZIDE 25 MG PO TABS: 25.0000 mg | ORAL_TABLET | Freq: Every day | ORAL | 11 refills | Status: DC

## 2017-02-02 MED ORDER — AZITHROMYCIN 250 MG PO TABS: ORAL_TABLET | ORAL | 0 refills | Status: DC

## 2017-02-02 NOTE — Patient Instructions (Addendum)
Drink a glass of water before every meal Drink 6-8 glasses of water daily Eat three meals daily Eat a protein and healthy fat with every meal (eggs,fish, chicken, [place] and limit red meats) Eat 5 servings of vegetables daily, mix the colors Eat 2 servings of fruit daily with skin, if skin is edible Use smaller plates Put food/utensils down as you chew and swallow each bite Eat at a table with friends/family at least once daily, no TV Do not eat in front of the TV  Make little goals with diet and physical activity  Wait until done with antibiotic before increasing Citalopram  Use Guafenesin and decongestant for mucous relief  Consider getting started on nicotine patches as previously discussed

## 2017-02-02 NOTE — Progress Notes (Signed)
Subjective:    Patient ID: Claudia Erickson, female    DOB: 11-20-1965, 51 y.o.   MRN: [id number]  HPI   1.  Major Depression:  Was able to get all her meds.  Has been taking all for 1 month.  Feels her Citalopram needs to be increased.  Was on 40 mg at one point.  Had been off medication for 2 months and was restarted after her suicide attempt, was only started on 10 mg.  Feels she definitely did better on 20 mg, but not a big difference from 20 mg to 40 mg. Very stressed as trying to sell a home. Not doing any counseling.  States she did not have $10.  She would like to go.    2.  Cough productive of thick yellow mucous for 1 week.  Had fever one day.  Having sinus pressure, chest tightness.  She does feel she is doing better as she is producing more mucous.  The last 2 days she has had significantly more production of mucous.   She used Claritin D at one point with improvement of itchy eyes.  Has had minimal sneezing.  No itchy throat.   Currently using Delsym. Others in family with similar illness.  3.  Elevated BP: better today, despite using decongestant and illness, though was also quite anxious last visit as out of her antidepressants.   4. DM:  Diagnosed in 2012. Utilizing  Novolin 70/30 40 units in the morning and 30 units before dinner.  Not testing sugars.   Admits she is not take caring for this seriously.   Feels she eats well, but  Has not completed MAP for Novolog.  5.  Knee pain :  Better with Meloxicam  6.  Swelling of ankles:  Legs often dependent.  Has been on Furosemide.  7.  Tobacco Use:  Did not get patches.  Current Meds  Medication Sig  . ARIPiprazole (ABILIFY) 5 MG tablet Take 1 tablet (5 mg total) by mouth daily. For mood stability  . citalopram (CELEXA) 20 MG tablet 1 tab by mouth daily for depression  . gabapentin (NEURONTIN) 100 MG capsule 4 caps by mouth 3 times daily for agitation/diabetic neuropathy  . insulin aspart protamine- aspart (NOVOLOG MIX  70/30) (70-30) 100 UNIT/ML injection Inject 0.3 mLs (30 Units total) into the skin daily with supper.  . insulin aspart protamine- aspart (NOVOLOG MIX 70/30) (70-30) 100 UNIT/ML injection Inject 0.4 mLs (40 Units total) into the skin daily with breakfast.  . LORazepam (ATIVAN) 1 MG tablet Take 1 tablet (1 mg total) by mouth every 8 (eight) hours as needed for anxiety.  . meloxicam (MOBIC) 15 MG tablet Take 1 tablet (15 mg total) by mouth daily.  . metFORMIN (GLUCOPHAGE) 850 MG tablet Take 1 tablet (850 mg total) by mouth 2 (two) times daily with a meal. For diabetes management  . mirtazapine (REMERON) 7.5 MG tablet Take 1 tablet (7.5 mg total) by mouth at bedtime. For insomnia  . omeprazole (PRILOSEC OTC) 20 MG tablet Take 1 tablet (20 mg total) by mouth daily. For acid reflux  . [DISCONTINUED] citalopram (CELEXA) 10 MG tablet Take 1 tablet (10 mg total) by mouth daily. For depression    No Known Allergies    Review of Systems     Objective:   Physical Exam  NAD HEENT:  PERRL, EOMI, TMs pearly gray, throat without injection, thick yellow mucous in nasal passages, decreased sinus pain with pressure over bilateral frontal  and maxillary sinus areas.   Neck:  Supple, No adenopathy Chest:  CTA CV: RRR with normal S1 and S2, No S3, S4 or murmur, radial pulses normal and equal.  No JVD Abd:  S, NT, No HSM or mass. + BS Legs with swelling below knee, minimal pitting at pretibial area.       Assessment & Plan:   1.  Major Depression:  Increase Citalopram to 40 mg daily once done with antibiotic for sinusitis.  Followup in 2 months.  2.  LE edema/chronic venous insufficiency:  HCTZ 25 mg daily in morning.  BMP in 2 weeks with bp check.  Follow up in 2 months.  3.  Acute sinusitis:  Azithromycin pack.  OTC guaifenesin and decongestant.  4.  Tobacco Abuse:  To consider getting started on nicotine patches as we discussed.   5.  Chronic knee pain:  Improved with Meloxicam  6.  DM:   Encouraged following through with treatment for DM.  Went over improving diet and gradually adding in physical activity as her depression improves with getting back on treatment.

## 2017-02-16 ENCOUNTER — Other Ambulatory Visit: Payer: Self-pay

## 2017-02-22 IMAGING — CR DG KNEE COMPLETE 4+V*L*
4 series · 4 of 4 positions shown · non-contrast
Comparison: None.

CLINICAL DATA: Trip and fall on wheelchair ramp earlier this
evening. Now with left knee pain.

EXAM:
LEFT KNEE - COMPLETE 4+ VIEW

[x knee ap left (1 of 3)]
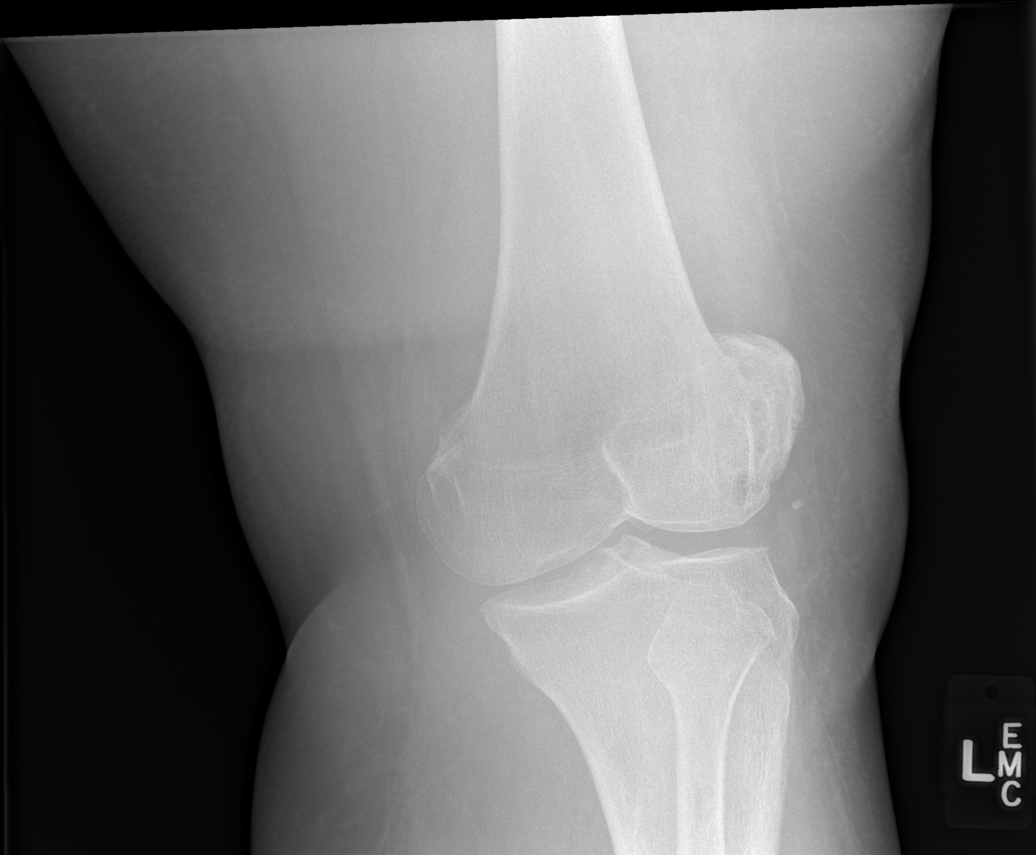

[x knee ap left (2 of 3)]
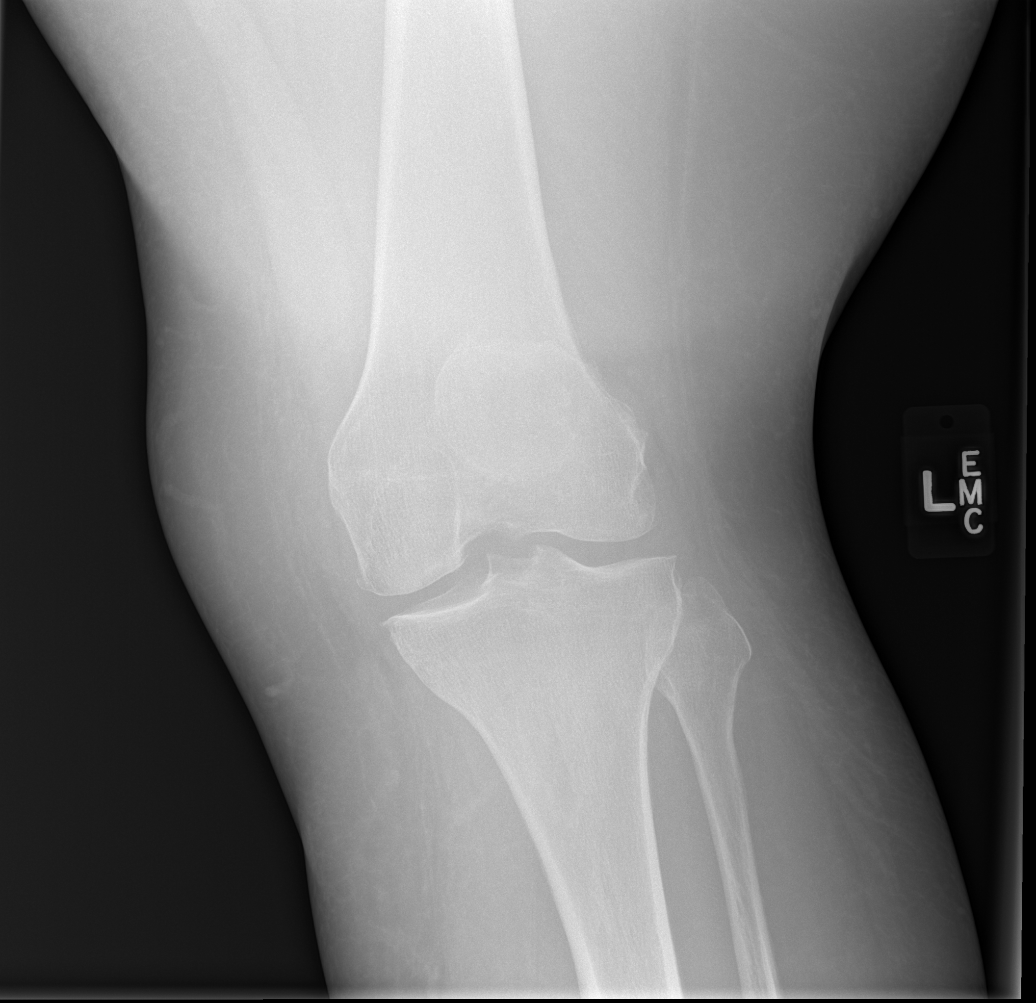

[x knee ap left (3 of 3)]
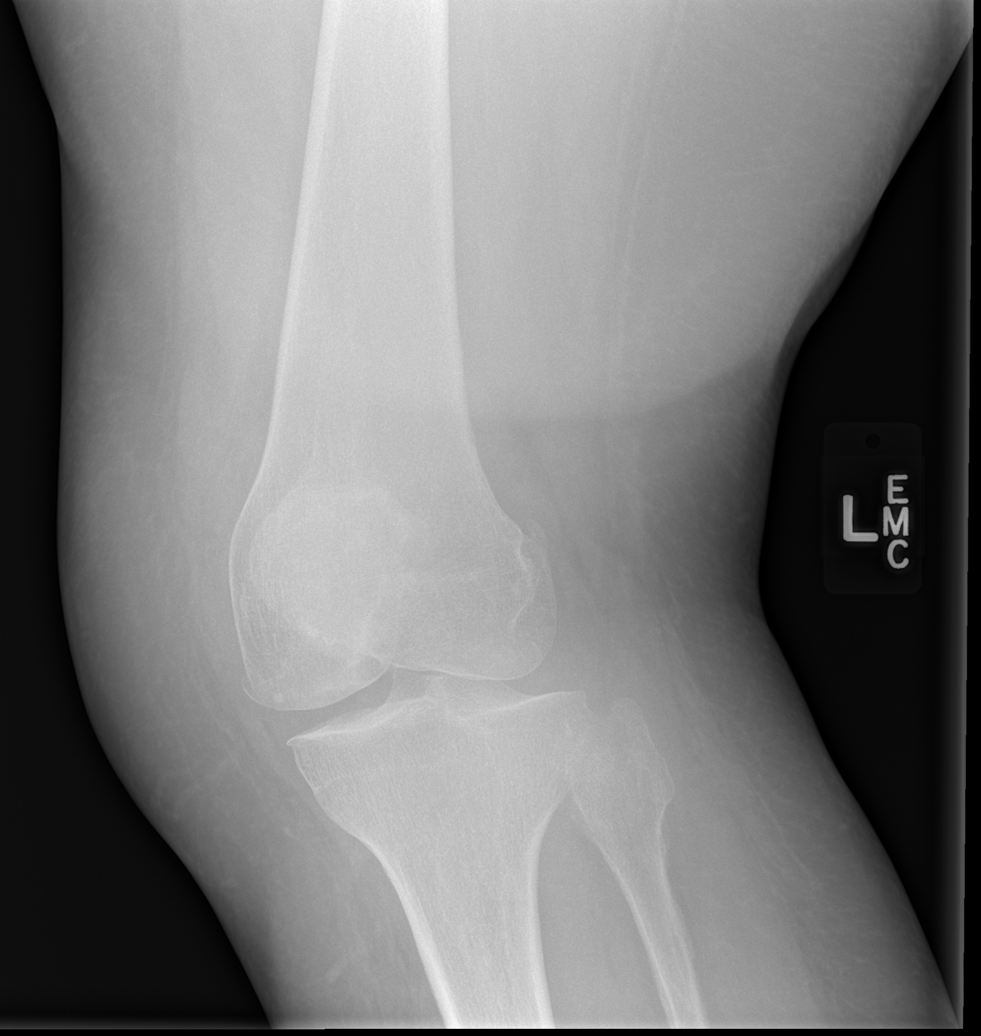

[x knee lat left]
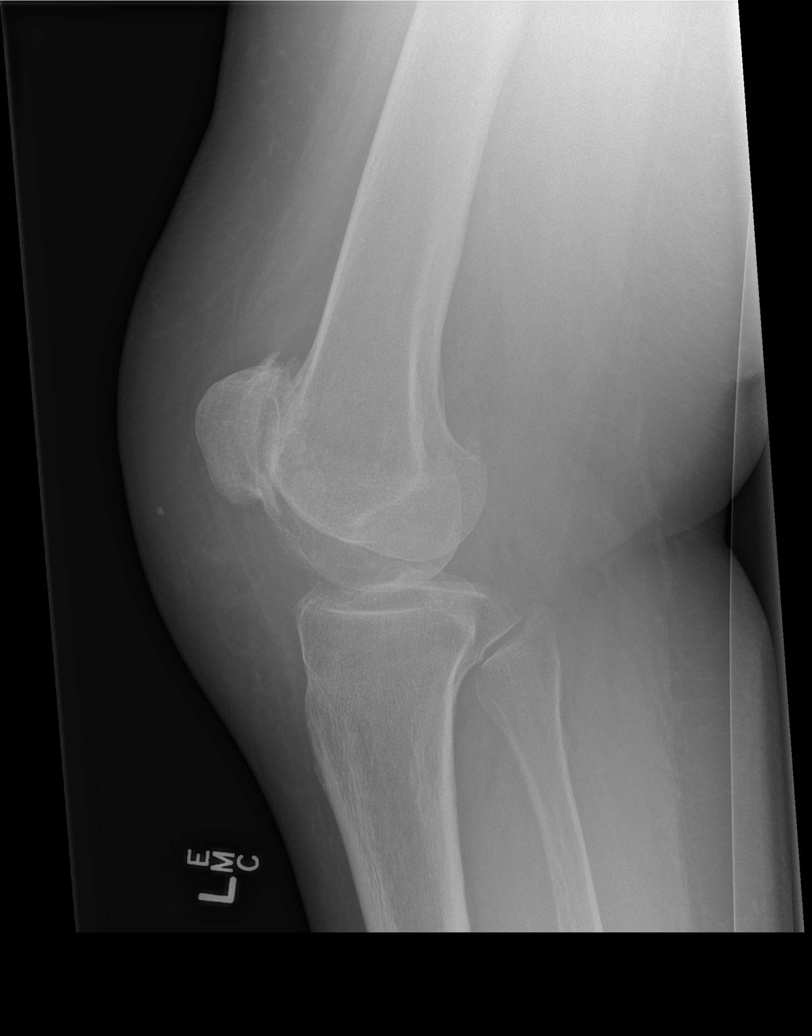

[4 of 4 positions shown; findings below may reference images not displayed]

FINDINGS: No fracture or dislocation. The alignment is maintained. Moderate
patellofemoral osteoarthritis with joint space narrowing and
osteophytes. Small medial tibial femoral osteophytes. No joint
effusion. Phlebolith noted in the anterior soft tissues.
IMPRESSION: 1. No fracture or dislocation of the left knee.
2. Mild-moderate osteoarthritis most significant in the
patellofemoral compartment.

## 2017-03-15 ENCOUNTER — Ambulatory Visit (HOSPITAL_COMMUNITY)
Admission: RE | Admit: 2017-03-15 | Discharge: 2017-03-15 | Disposition: A | Discharge status: Home / Self Care | Payer: Federal, State, Local not specified - Other | Attending: Psychiatry | Admitting: Psychiatry

## 2017-03-15 ENCOUNTER — Emergency Department (HOSPITAL_COMMUNITY)
Admission: EM | Admit: 2017-03-15 | Discharge: 2017-03-16 | Disposition: A | Discharge status: Other Acute Inpatient Hospital | Payer: Self-pay | Attending: Emergency Medicine | Admitting: Emergency Medicine

## 2017-03-15 ENCOUNTER — Encounter (HOSPITAL_COMMUNITY): Payer: Self-pay | Admitting: Emergency Medicine

## 2017-03-15 DIAGNOSIS — F332 Major depressive disorder, recurrent severe without psychotic features: Secondary | ICD-10-CM

## 2017-03-15 DIAGNOSIS — R739 Hyperglycemia, unspecified: Secondary | ICD-10-CM

## 2017-03-15 DIAGNOSIS — Z91148 Patient's other noncompliance with medication regimen for other reason: Secondary | ICD-10-CM

## 2017-03-15 DIAGNOSIS — R45851 Suicidal ideations: Secondary | ICD-10-CM

## 2017-03-15 DIAGNOSIS — E1165 Type 2 diabetes mellitus with hyperglycemia: Secondary | ICD-10-CM | POA: Insufficient documentation

## 2017-03-15 DIAGNOSIS — I10 Essential (primary) hypertension: Secondary | ICD-10-CM | POA: Insufficient documentation

## 2017-03-15 DIAGNOSIS — F1410 Cocaine abuse, uncomplicated: Secondary | ICD-10-CM

## 2017-03-15 DIAGNOSIS — Z046 Encounter for general psychiatric examination, requested by authority: Secondary | ICD-10-CM | POA: Insufficient documentation

## 2017-03-15 DIAGNOSIS — Z9114 Patient's other noncompliance with medication regimen: Secondary | ICD-10-CM | POA: Insufficient documentation

## 2017-03-15 DIAGNOSIS — F323 Major depressive disorder, single episode, severe with psychotic features: Secondary | ICD-10-CM | POA: Insufficient documentation

## 2017-03-15 DIAGNOSIS — Z79899 Other long term (current) drug therapy: Secondary | ICD-10-CM | POA: Insufficient documentation

## 2017-03-15 DIAGNOSIS — F17210 Nicotine dependence, cigarettes, uncomplicated: Secondary | ICD-10-CM | POA: Insufficient documentation

## 2017-03-15 DIAGNOSIS — Z794 Long term (current) use of insulin: Secondary | ICD-10-CM | POA: Insufficient documentation

## 2017-03-15 LAB — COMPREHENSIVE METABOLIC PANEL
ALK PHOS: 71 U/L (ref 38–126)
ALT: 25 U/L (ref 14–54)
AST: 21 U/L (ref 15–41)
Albumin: 3.7 g/dL (ref 3.5–5.0)
Anion gap: 13 (ref 5–15)
BILIRUBIN TOTAL: 0.5 mg/dL (ref 0.3–1.2)
BUN: 17 mg/dL (ref 6–20)
CO2: 21 mmol/L — ABNORMAL LOW (ref 22–32)
CREATININE: 0.75 mg/dL (ref 0.44–1.00)
Calcium: 9.4 mg/dL (ref 8.9–10.3)
Chloride: 99 mmol/L — ABNORMAL LOW (ref 101–111)
GFR calc Af Amer: >60 mL/min (ref >60)
GLUCOSE: 336 mg/dL — AB (ref 65–99)
Potassium: 4 mmol/L (ref 3.5–5.1)
Sodium: 133 mmol/L — ABNORMAL LOW (ref 135–145)
TOTAL PROTEIN: 6.8 g/dL (ref 6.5–8.1)

## 2017-03-15 LAB — RAPID URINE DRUG SCREEN, HOSP PERFORMED
AMPHETAMINES: NOT DETECTED
BARBITURATES: NOT DETECTED
Benzodiazepines: NOT DETECTED
Cocaine: POSITIVE — AB
Opiates: NOT DETECTED
Tetrahydrocannabinol: NOT DETECTED

## 2017-03-15 LAB — CBC
HEMATOCRIT: 43.4 % (ref 36.0–46.0)
Hemoglobin: 14.9 g/dL (ref 12.0–15.0)
MCH: 29.4 pg (ref 26.0–34.0)
MCHC: 34.3 g/dL (ref 30.0–36.0)
MCV: 85.8 fL (ref 78.0–100.0)
PLATELETS: 261 10*3/uL (ref 150–400)
RBC: 5.06 MIL/uL (ref 3.87–5.11)
RDW: 13.3 % (ref 11.5–15.5)
WBC: 13.5 10*3/uL — ABNORMAL HIGH (ref 4.0–10.5)

## 2017-03-15 LAB — ACETAMINOPHEN LEVEL: Acetaminophen (Tylenol), Serum: <10 ug/mL — ABNORMAL LOW (ref 10–30)

## 2017-03-15 LAB — ETHANOL

## 2017-03-15 LAB — SALICYLATE LEVEL: Salicylate Lvl: <7 mg/dL (ref 2.8–30.0)

## 2017-03-15 MED ORDER — GABAPENTIN 400 MG PO CAPS
400.0000 mg | ORAL_CAPSULE | Freq: Three times a day (TID) | ORAL | Status: DC
  Administered 2017-03-16: 400 mg via ORAL
  Filled 2017-03-15: qty 1

## 2017-03-15 MED ORDER — METFORMIN HCL 850 MG PO TABS
850.0000 mg | ORAL_TABLET | Freq: Two times a day (BID) | ORAL | Status: DC
  Administered 2017-03-16: 850 mg via ORAL
  Filled 2017-03-15 (×2): qty 1

## 2017-03-15 MED ORDER — ONDANSETRON HCL 4 MG PO TABS: 4.0000 mg | ORAL_TABLET | Freq: Three times a day (TID) | ORAL | Status: DC | PRN

## 2017-03-15 MED ORDER — ZOLPIDEM TARTRATE 5 MG PO TABS
5.0000 mg | ORAL_TABLET | Freq: Every evening | ORAL | Status: DC | PRN
  Administered 2017-03-16: 5 mg via ORAL
  Filled 2017-03-15: qty 1

## 2017-03-15 MED ORDER — INSULIN ASPART PROT & ASPART (70-30 MIX) 100 UNIT/ML ~~LOC~~ SUSP: 30.0000 [IU] | Freq: Every day | SUBCUTANEOUS | Status: DC

## 2017-03-15 MED ORDER — ARIPIPRAZOLE 5 MG PO TABS
5.0000 mg | ORAL_TABLET | Freq: Every day | ORAL | Status: DC
  Administered 2017-03-16: 5 mg via ORAL
  Filled 2017-03-15 (×2): qty 1

## 2017-03-15 MED ORDER — CITALOPRAM HYDROBROMIDE 10 MG PO TABS
20.0000 mg | ORAL_TABLET | Freq: Every day | ORAL | Status: DC
  Administered 2017-03-16: 20 mg via ORAL
  Filled 2017-03-15: qty 2

## 2017-03-15 MED ORDER — LORAZEPAM 1 MG PO TABS
1.0000 mg | ORAL_TABLET | Freq: Three times a day (TID) | ORAL | Status: DC | PRN
  Administered 2017-03-16: 1 mg via ORAL
  Filled 2017-03-15: qty 1

## 2017-03-15 MED ORDER — NICOTINE 21 MG/24HR TD PT24
21.0000 mg | MEDICATED_PATCH | Freq: Every day | TRANSDERMAL | Status: DC
  Administered 2017-03-16: 21 mg via TRANSDERMAL
  Filled 2017-03-15: qty 1

## 2017-03-15 MED ORDER — HYDROCHLOROTHIAZIDE 25 MG PO TABS
25.0000 mg | ORAL_TABLET | Freq: Every day | ORAL | Status: DC
  Administered 2017-03-16: 25 mg via ORAL
  Filled 2017-03-15 (×2): qty 1

## 2017-03-15 MED ORDER — INSULIN ASPART PROT & ASPART (70-30 MIX) 100 UNIT/ML ~~LOC~~ SUSP
40.0000 [IU] | Freq: Every day | SUBCUTANEOUS | Status: DC
  Administered 2017-03-16: 40 [IU] via SUBCUTANEOUS
  Filled 2017-03-15: qty 10

## 2017-03-15 MED ORDER — MIRTAZAPINE 7.5 MG PO TABS
7.5000 mg | ORAL_TABLET | Freq: Every day | ORAL | Status: DC
  Administered 2017-03-16: 7.5 mg via ORAL
  Filled 2017-03-15: qty 1

## 2017-03-15 MED ORDER — PANTOPRAZOLE SODIUM 40 MG PO TBEC
40.0000 mg | DELAYED_RELEASE_TABLET | Freq: Every day | ORAL | Status: DC
  Administered 2017-03-16: 40 mg via ORAL
  Filled 2017-03-15: qty 1

## 2017-03-15 MED ORDER — ACETAMINOPHEN 325 MG PO TABS: 650.0000 mg | ORAL_TABLET | ORAL | Status: DC | PRN

## 2017-03-15 NOTE — ED Triage Notes (Signed)
Patient c/o SI without plan x "a few weeks." Denies HI/A/V/H. Reports last used cocaine 2 days ago.
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
## 2017-03-15 NOTE — ED Provider Notes (Signed)
[facility]-EMERGENCY DEPT Provider Note   CSN: [id number] Arrival date & time: 03/15/17  2037     History   Chief Complaint Chief Complaint  Patient presents with  . Suicidal    HPI Claudia Erickson is a 51 y.o. female.  Patient is here for evaluation of depression and anxiety, and thoughts of suicide.  Went to the behavioral health Hospital, earlier this evening. She described stressors in her life, including caregiving for her father.  She has not been able to sleep much and is not hungry enough to eat.  She does not have a suicidal plan.  She continues to use crack cocaine. The patient was sent here for "medical clearance."  She denies recent fever, chills, nausea, vomiting, weakness or dizziness.  There are no other known modifying factors.  She is not taking any of her medicines for 1 month, by her choice.  There are no other known modifying factors.  HPI  Past Medical History:  Diagnosis Date  . Anxiety and depression   . Chronic back pain   . Diabetes mellitus without complication (HCC)   . GERD (gastroesophageal reflux disease)   . Hypertension   . Panic attack 1990s  . Peripheral neuropathy   . Positive TB test   . Substance abuse   . Substance induced mood disorder (HCC)   . UTI (lower urinary tract infection)     Patient Active Problem List   Diagnosis Date Noted  . Tobacco abuse 02/02/2017  . Panic attack   . Chronic venous insufficiency 10/28/2016  . Hyponatremia 10/28/2016  . Hyperglycemia   . Intentional drug overdose (HCC)   . Suicidal ideation   . Urinary frequency 04/08/2015  . Anxiety and depression 12/26/2014  . Insomnia 12/26/2014  . MDD (major depressive disorder), recurrent severe, without psychosis (HCC) 08/27/2014  . Depressive disorder 07/28/2014  . Aspiration into airway 12/06/2013  . Aspiration pneumonia (HCC) 12/06/2013  . Polysubstance abuse 12/06/2013  . Diabetes mellitus (HCC) 12/06/2013  . Syncope 12/06/2013  . Leucocytosis 12/06/2013    . Acute respiratory failure (HCC) 12/06/2013  . Elevated LFTs 12/06/2013    Past Surgical History:  Procedure Laterality Date  . ABDOMINAL HYSTERECTOMY    . CHOLECYSTECTOMY    . ROTATOR CUFF REPAIR    . TONSILLECTOMY      OB History    No data available       Home Medications    Prior to Admission medications   Medication Sig Start Date End Date Taking? Authorizing Provider  ARIPiprazole (ABILIFY) 5 MG tablet Take 1 tablet (5 mg total) by mouth daily. For mood stability 12/29/16  Yes Julieanne Manson, MD  citalopram (CELEXA) 20 MG tablet 1 tab by mouth daily for depression 02/02/17  Yes Julieanne Manson, MD  gabapentin (NEURONTIN) 100 MG capsule 4 caps by mouth 3 times daily for agitation/diabetic neuropathy 12/29/16  Yes Julieanne Manson, MD  hydrochlorothiazide (HYDRODIURIL) 25 MG tablet Take 1 tablet (25 mg total) by mouth daily. 02/02/17  Yes Julieanne Manson, MD  insulin aspart protamine- aspart (NOVOLOG MIX 70/30) (70-30) 100 UNIT/ML injection Inject 0.3 mLs (30 Units total) into the skin daily with supper. 12/29/16  Yes Julieanne Manson, MD  insulin aspart protamine- aspart (NOVOLOG MIX 70/30) (70-30) 100 UNIT/ML injection Inject 0.4 mLs (40 Units total) into the skin daily with breakfast. 12/29/16  Yes Julieanne Manson, MD  loratadine (CLARITIN) 10 MG tablet Take 10 mg by mouth daily.   Yes [provider]  LORazepam (ATIVAN)  1 MG tablet Take 1 tablet (1 mg total) by mouth every 8 (eight) hours as needed for anxiety. 01/24/17  Yes Julieanne Manson, MD  meloxicam (MOBIC) 15 MG tablet Take 1 tablet (15 mg total) by mouth daily. 12/29/16  Yes Julieanne Manson, MD  metFORMIN (GLUCOPHAGE) 850 MG tablet Take 1 tablet (850 mg total) by mouth 2 (two) times daily with a meal. For diabetes management 12/29/16  Yes Julieanne Manson, MD  mirtazapine (REMERON) 7.5 MG tablet Take 1 tablet (7.5 mg total) by mouth at bedtime. For insomnia 12/29/16  Yes Julieanne Manson, MD  omeprazole (PRILOSEC OTC) 20 MG tablet Take 1 tablet (20 mg total) by mouth daily. For acid reflux 12/29/16  Yes Julieanne Manson, MD    Family History Family History  Problem Relation Age of Onset  . Diabetes Mellitus II Father   . Hyperlipidemia Father   . Diabetes Father   . Colon cancer Father 57       not sure of age he was diagnosed  . Arthritis Mother   . Hypertension Mother   . Heart disease Mother   . Drug abuse Son        incarcerated with drug issues  . CAD Neg Hx     Social History Social History  Substance Use Topics  . Smoking status: Current Every Day Smoker    Packs/day: 0.50    Years: 30.00    Types: Cigarettes  . Smokeless tobacco: Never Used  . Alcohol use No     Allergies   Patient has no known allergies.   Review of Systems Review of Systems  All other systems reviewed and are negative.    Physical Exam Updated Vital Signs BP 121/86 (BP Location: Left Arm)   Pulse (!) 102   Temp 98.1 F (36.7 C) (Oral)   Resp 20   Ht 5\' 9"  (1.753 m)   Wt 136.1 kg (300 lb)   SpO2 97%   BMI 44.30 kg/m    Physical Exam  Constitutional: She is oriented to person, place, and time. She appears well-developed and well-nourished.  HENT:  Head: Normocephalic and atraumatic.  Eyes: Conjunctivae and EOM are normal. Pupils are equal, round, and reactive to light.  Neck: Normal range of motion and phonation normal. Neck supple.  Cardiovascular: Normal rate and regular rhythm.   Pulmonary/Chest: Effort normal and breath sounds normal. She exhibits no tenderness.  Abdominal: Soft. She exhibits no distension. There is no tenderness. There is no guarding.  Musculoskeletal: Normal range of motion.  Neurological: She is alert and oriented to person, place, and time. She exhibits normal muscle tone.  Skin: Skin is warm and dry.  Psychiatric: She has a normal mood and affect. Her behavior is normal. Judgment and thought content normal.  Nursing note  and vitals reviewed.    ED Treatments / Results  Labs (all labs ordered are listed, but only abnormal results are displayed) Labs Reviewed  COMPREHENSIVE METABOLIC PANEL - Abnormal; Notable for the following:       Result Value   Sodium 133 (*)    Chloride 99 (*)    CO2 21 (*)    Glucose, Bld 336 (*)    All other components within normal limits  ACETAMINOPHEN LEVEL - Abnormal; Notable for the following:    Acetaminophen (Tylenol), Serum <10 (*)    All other components within normal limits  CBC - Abnormal; Notable for the following:    WBC 13.5 (*)    All  other components within normal limits  RAPID URINE DRUG SCREEN, HOSP PERFORMED - Abnormal; Notable for the following:    Cocaine POSITIVE (*)    All other components within normal limits  ETHANOL  SALICYLATE LEVEL  PREGNANCY, URINE    EKG  EKG Interpretation None       Radiology No results found.  Procedures Procedures (including critical care time)  Medications Ordered in ED Medications  ARIPiprazole (ABILIFY) tablet 5 mg (not administered)  citalopram (CELEXA) tablet 20 mg (not administered)  gabapentin (NEURONTIN) capsule 400 mg (not administered)  hydrochlorothiazide (HYDRODIURIL) tablet 25 mg (not administered)  insulin aspart protamine- aspart (NOVOLOG MIX 70/30) injection 30 Units (not administered)  insulin aspart protamine- aspart (NOVOLOG MIX 70/30) injection 40 Units (not administered)  LORazepam (ATIVAN) tablet 1 mg (not administered)  metFORMIN (GLUCOPHAGE) tablet 850 mg (not administered)  mirtazapine (REMERON) tablet 7.5 mg (not administered)  pantoprazole (PROTONIX) EC tablet 40 mg (not administered)  acetaminophen (TYLENOL) tablet 650 mg (not administered)  ondansetron (ZOFRAN) tablet 4 mg (not administered)  zolpidem (AMBIEN) tablet 5 mg (not administered)  nicotine (NICODERM CQ - dosed in mg/24 hours) patch 21 mg (not administered)     Initial Impression / Assessment and Plan / ED Course   I have reviewed the triage vital signs and the nursing notes.  Pertinent labs & imaging results that were available during my care of the patient were reviewed by me and considered in my medical decision making (see chart for details).  Clinical Course as of Mar 16 2323  Tue Mar 15, 2017  2321 Substance abuse COCAINE: (!) POSITIVE [EW]  2321 Moderate elevation Glucose: (!) 336 [EW]  2321 Normal Anion gap: 13 [EW]  2322 At this time the patient is medically cleared for treatment by psychiatry  [EW]    Clinical Course User Index [EW] Mancel BaleWentz, Zenovia Justman, MD     Patient Vitals for the past 24 hrs:  BP Temp Temp src Pulse Resp SpO2 Height Weight  03/15/17 2053 121/86 98.1 F (36.7 C) Oral (!) 102 20 97 % 5\' 9"  (1.753 m) 136.1 kg (300 lb)    T TS consultation   Final Clinical Impressions(s) / ED Diagnoses   Final diagnoses:  Suicidal ideation  Noncompliance with medication regimen  Cocaine abuse  Hyperglycemia   Nontoxic patient presenting with medication noncompliance, suicidal ideation and ongoing substance abuse.  Patient with psychosocial stressors, and poor coping mechanism.  Doubt serious bacterial infection, metabolic instability or impending vascular collapse.  She will require evaluation by psychiatry, prior to disposition.  Nursing Notes Reviewed/ Care Coordinated Applicable Imaging Reviewed Interpretation of Laboratory Data incorporated into ED treatment   Plan: Admit  New Prescriptions New Prescriptions   No medications on file    Mancel BaleWentz, Samiksha Pellicano, MD 03/15/17 2324

## 2017-03-16 ENCOUNTER — Inpatient Hospital Stay (HOSPITAL_COMMUNITY)
Admission: AD | Admit: 2017-03-16 | Discharge: 2017-03-22 | DRG: 885 | Disposition: A | Discharge status: Home / Self Care | Payer: No Typology Code available for payment source | Source: Intra-hospital | Attending: Psychiatry | Admitting: Psychiatry

## 2017-03-16 ENCOUNTER — Encounter (HOSPITAL_COMMUNITY): Payer: Self-pay

## 2017-03-16 DIAGNOSIS — F4000 Agoraphobia, unspecified: Secondary | ICD-10-CM | POA: Diagnosis present

## 2017-03-16 DIAGNOSIS — I872 Venous insufficiency (chronic) (peripheral): Secondary | ICD-10-CM | POA: Diagnosis present

## 2017-03-16 DIAGNOSIS — F1410 Cocaine abuse, uncomplicated: Secondary | ICD-10-CM | POA: Diagnosis present

## 2017-03-16 DIAGNOSIS — Z9049 Acquired absence of other specified parts of digestive tract: Secondary | ICD-10-CM

## 2017-03-16 DIAGNOSIS — F17210 Nicotine dependence, cigarettes, uncomplicated: Secondary | ICD-10-CM | POA: Diagnosis present

## 2017-03-16 DIAGNOSIS — G479 Sleep disorder, unspecified: Secondary | ICD-10-CM

## 2017-03-16 DIAGNOSIS — Z9114 Patient's other noncompliance with medication regimen: Secondary | ICD-10-CM

## 2017-03-16 DIAGNOSIS — Z8349 Family history of other endocrine, nutritional and metabolic diseases: Secondary | ICD-10-CM | POA: Diagnosis not present

## 2017-03-16 DIAGNOSIS — E1142 Type 2 diabetes mellitus with diabetic polyneuropathy: Secondary | ICD-10-CM | POA: Diagnosis present

## 2017-03-16 DIAGNOSIS — F410 Panic disorder [episodic paroxysmal anxiety] without agoraphobia: Secondary | ICD-10-CM | POA: Diagnosis present

## 2017-03-16 DIAGNOSIS — Z811 Family history of alcohol abuse and dependence: Secondary | ICD-10-CM | POA: Diagnosis not present

## 2017-03-16 DIAGNOSIS — I10 Essential (primary) hypertension: Secondary | ICD-10-CM | POA: Diagnosis present

## 2017-03-16 DIAGNOSIS — Z915 Personal history of self-harm: Secondary | ICD-10-CM | POA: Diagnosis not present

## 2017-03-16 DIAGNOSIS — Z813 Family history of other psychoactive substance abuse and dependence: Secondary | ICD-10-CM | POA: Diagnosis not present

## 2017-03-16 DIAGNOSIS — G4700 Insomnia, unspecified: Secondary | ICD-10-CM | POA: Diagnosis present

## 2017-03-16 DIAGNOSIS — F332 Major depressive disorder, recurrent severe without psychotic features: Secondary | ICD-10-CM | POA: Diagnosis present

## 2017-03-16 DIAGNOSIS — Z90710 Acquired absence of both cervix and uterus: Secondary | ICD-10-CM

## 2017-03-16 DIAGNOSIS — Z810 Family history of intellectual disabilities: Secondary | ICD-10-CM | POA: Diagnosis not present

## 2017-03-16 DIAGNOSIS — Z8249 Family history of ischemic heart disease and other diseases of the circulatory system: Secondary | ICD-10-CM

## 2017-03-16 DIAGNOSIS — Z833 Family history of diabetes mellitus: Secondary | ICD-10-CM

## 2017-03-16 DIAGNOSIS — T1491XA Suicide attempt, initial encounter: Secondary | ICD-10-CM | POA: Diagnosis not present

## 2017-03-16 DIAGNOSIS — Z800 Family history of malignant neoplasm of digestive organs: Secondary | ICD-10-CM | POA: Diagnosis not present

## 2017-03-16 DIAGNOSIS — Z599 Problem related to housing and economic circumstances, unspecified: Secondary | ICD-10-CM

## 2017-03-16 DIAGNOSIS — Z794 Long term (current) use of insulin: Secondary | ICD-10-CM

## 2017-03-16 DIAGNOSIS — R45851 Suicidal ideations: Secondary | ICD-10-CM

## 2017-03-16 DIAGNOSIS — T43212A Poisoning by selective serotonin and norepinephrine reuptake inhibitors, intentional self-harm, initial encounter: Secondary | ICD-10-CM | POA: Diagnosis not present

## 2017-03-16 DIAGNOSIS — K219 Gastro-esophageal reflux disease without esophagitis: Secondary | ICD-10-CM | POA: Diagnosis present

## 2017-03-16 DIAGNOSIS — Z560 Unemployment, unspecified: Secondary | ICD-10-CM

## 2017-03-16 DIAGNOSIS — Z8261 Family history of arthritis: Secondary | ICD-10-CM | POA: Diagnosis not present

## 2017-03-16 LAB — GLUCOSE, CAPILLARY: GLUCOSE-CAPILLARY: 258 mg/dL — AB (ref 65–99)

## 2017-03-16 LAB — PREGNANCY, URINE: Preg Test, Ur: NEGATIVE

## 2017-03-16 MED ORDER — METFORMIN HCL 850 MG PO TABS
850.0000 mg | ORAL_TABLET | Freq: Two times a day (BID) | ORAL | Status: DC
Start: 2017-03-17 — End: 2017-03-22
  Administered 2017-03-17 – 2017-03-22 (×11): 850 mg via ORAL
  Filled 2017-03-16 (×16): qty 1

## 2017-03-16 MED ORDER — GABAPENTIN 300 MG PO CAPS
300.0000 mg | ORAL_CAPSULE | Freq: Three times a day (TID) | ORAL | Status: DC
  Administered 2017-03-16: 300 mg via ORAL
  Filled 2017-03-16: qty 1

## 2017-03-16 MED ORDER — MAGNESIUM HYDROXIDE 400 MG/5ML PO SUSP
30.0000 mL | Freq: Every day | ORAL | Status: DC | PRN
Start: 2017-03-16 — End: 2017-03-22

## 2017-03-16 MED ORDER — GABAPENTIN 300 MG PO CAPS
300.0000 mg | ORAL_CAPSULE | Freq: Three times a day (TID) | ORAL | Status: DC
  Administered 2017-03-17 – 2017-03-18 (×4): 300 mg via ORAL
  Filled 2017-03-16 (×10): qty 1

## 2017-03-16 MED ORDER — ACETAMINOPHEN 325 MG PO TABS
650.0000 mg | ORAL_TABLET | ORAL | Status: DC | PRN
  Administered 2017-03-16 – 2017-03-20 (×5): 650 mg via ORAL
  Filled 2017-03-16 (×5): qty 2

## 2017-03-16 MED ORDER — ARIPIPRAZOLE 5 MG PO TABS
5.0000 mg | ORAL_TABLET | Freq: Every day | ORAL | Status: DC
  Administered 2017-03-17: 5 mg via ORAL
  Filled 2017-03-16 (×3): qty 1

## 2017-03-16 MED ORDER — NICOTINE 21 MG/24HR TD PT24
21.0000 mg | MEDICATED_PATCH | Freq: Every day | TRANSDERMAL | Status: DC
  Administered 2017-03-17 – 2017-03-22 (×6): 21 mg via TRANSDERMAL
  Filled 2017-03-16 (×9): qty 1

## 2017-03-16 MED ORDER — ALUM & MAG HYDROXIDE-SIMETH 200-200-20 MG/5ML PO SUSP
30.0000 mL | ORAL | Status: DC | PRN
Start: 2017-03-16 — End: 2017-03-22

## 2017-03-16 MED ORDER — ONDANSETRON HCL 4 MG PO TABS: 4.0000 mg | ORAL_TABLET | Freq: Three times a day (TID) | ORAL | Status: DC | PRN

## 2017-03-16 MED ORDER — INSULIN ASPART PROT & ASPART (70-30 MIX) 100 UNIT/ML ~~LOC~~ SUSP
30.0000 [IU] | Freq: Every day | SUBCUTANEOUS | Status: DC
  Administered 2017-03-16 – 2017-03-20 (×5): 30 [IU] via SUBCUTANEOUS

## 2017-03-16 MED ORDER — HYDROXYZINE HCL 25 MG PO TABS
25.0000 mg | ORAL_TABLET | Freq: Four times a day (QID) | ORAL | Status: DC | PRN
  Administered 2017-03-16 – 2017-03-17 (×2): 25 mg via ORAL
  Filled 2017-03-16 (×3): qty 1

## 2017-03-16 MED ORDER — MIRTAZAPINE 7.5 MG PO TABS
7.5000 mg | ORAL_TABLET | Freq: Every day | ORAL | Status: DC
  Administered 2017-03-16 – 2017-03-18 (×3): 7.5 mg via ORAL
  Filled 2017-03-16 (×5): qty 1

## 2017-03-16 MED ORDER — HYDROCHLOROTHIAZIDE 25 MG PO TABS
25.0000 mg | ORAL_TABLET | Freq: Every day | ORAL | Status: DC
  Administered 2017-03-17 – 2017-03-22 (×6): 25 mg via ORAL
  Filled 2017-03-16 (×10): qty 1

## 2017-03-16 MED ORDER — INSULIN ASPART PROT & ASPART (70-30 MIX) 100 UNIT/ML ~~LOC~~ SUSP
40.0000 [IU] | Freq: Every day | SUBCUTANEOUS | Status: DC
  Administered 2017-03-18 – 2017-03-22 (×5): 40 [IU] via SUBCUTANEOUS

## 2017-03-16 MED ORDER — ONDANSETRON HCL 4 MG PO TABS
4.0000 mg | ORAL_TABLET | Freq: Three times a day (TID) | ORAL | Status: DC | PRN
Start: 2017-03-16 — End: 2017-03-16

## 2017-03-16 MED ORDER — CITALOPRAM HYDROBROMIDE 20 MG PO TABS
20.0000 mg | ORAL_TABLET | Freq: Every day | ORAL | Status: DC
  Administered 2017-03-17 – 2017-03-19 (×3): 20 mg via ORAL
  Filled 2017-03-16 (×5): qty 1

## 2017-03-16 MED ORDER — PANTOPRAZOLE SODIUM 40 MG PO TBEC
40.0000 mg | DELAYED_RELEASE_TABLET | Freq: Every day | ORAL | Status: DC
  Administered 2017-03-17 – 2017-03-22 (×6): 40 mg via ORAL
  Filled 2017-03-16 (×9): qty 1

## 2017-03-16 NOTE — ED Notes (Signed)
Introduced self to patient. Pt oriented to unit expectations.  Assessed pt for:  A) Anxiety &/or agitation: On admission to the [facility] pt's affect is flat and she is depressed and anxious.  She is disgruntled that she cannot have Ativan and [facility].  S) Safety: Safety maintained with q-15-minute checks and hourly rounds by staff.  A) ADLs: Pt able to perform ADLs independently.  P) Pick-Up (room cleanliness): Pt's room clean and free of clutter.

## 2017-03-16 NOTE — ED Notes (Signed)
[facility] [employer] [facility] She was calm and cooperative. All belongings returned to pt who signed for same.

## 2017-03-16 NOTE — Progress Notes (Signed)
03/16/17 1335:  LRT went to pt room, pt was sleep.  Alizay Bronkema, LRT/CTRS 

## 2017-03-16 NOTE — Progress Notes (Signed)
Patient ID: Claudia Erickson, female   DOB: 04/02/1966, 51 y.o.   MRN: [id number]  D) Pt is a 51 year old who is voluntarily admitted to the service of Dr. Jama Flavorsobos. Pt reports that she is feeling actively suicidal and has a plan to use carbon dioxide in her fathers garage. Pt. States that she was last here a few months ago and did learn some good coping skills "I just don't follow through with things. I brought myself here because I want to work hard on feeling better". Pt was tearful throughout the admission process stating that "I find no joy in anything at all. The only reason I would want to live is for my cat and my grandson". Pt is diabetic and her blood sugar was 258. Pt has not been taking any of her medications for over a month. Reports that she has pain in both her feet, rating it at a 8 and states cold helps her neuropathy. A) Contracting with Pt for safety. Orienting Pt to the unit. Giving praise and reassurance along with praise. Provided with a 1:1 R) Pt contracts for safety while on the unit. States that she will remain safe while on the unit.

## 2017-03-16 NOTE — Tx Team (Signed)
Initial Treatment Plan 03/16/2017 7:55 PM Claudia Erickson [id number]:[id number]    PATIENT STRESSORS: Medication change or noncompliance   PATIENT STRENGTHS: Ability for insight Active sense of humor Average or above average intelligence General fund of knowledge Work skills   PATIENT IDENTIFIED PROBLEMS: Suicidally Depression Anxiety Non compliance with medications                     DISCHARGE CRITERIA:  Ability to meet basic life and health needs Improved stabilization in mood, thinking, and/or behavior  PRELIMINARY DISCHARGE PLAN: Outpatient therapy Return to previous living arrangement  PATIENT/FAMILY INVOLVEMENT: This treatment plan has been presented to and reviewed with the patient, Claudia Erickson, and/or family member, .  The patient and family have been given the opportunity to ask questions and make suggestions.  Dione HousekeeperJudge, Claudia Zuercher A, RN 03/16/2017, 7:55 PM

## 2017-03-16 NOTE — ED Notes (Signed)
[facility]: No one answered the telephone.

## 2017-03-16 NOTE — ED Notes (Signed)
Bed: [facility] Expected date:  Expected time:  Means of arrival:  Comments: Triage 4 

## 2017-03-16 NOTE — Plan of Care (Signed)
Problem: Safety: Goal: Periods of time without injury will increase Outcome: Progressing Pt. remains a low fall risk, denies SI/HI/AVH at this time, Q 15 checks in place.    

## 2017-03-16 NOTE — Progress Notes (Signed)
Inpatient Diabetes Program Recommendations  AACE/ADA: New Consensus Statement on Inpatient Glycemic Control (2015)  Target Ranges:  Prepandial:   less than 140 mg/dL      Peak postprandial:   less than 180 mg/dL (1-2 hours)      Critically ill patients:  140 - 180 mg/dL   Results for Claudia BickersLVIS, [name] (MRN [id number]) as of 03/16/2017 11:37  Ref. Range 03/15/2017 21:47  Glucose Latest Ref Range: 65 - 99 mg/dL [id number] (H)    To [facility] ED: Depression and anxiety, Suicidal Thoughts  History: DM  Home DM Meds: Metformin 850 mg BID       70/30 Insulin: 40 units AM/ 30 units PM  Current Orders: Metformin 850 mg BID      70/30 Insulin: 40 units AM/ 30 units PM     MD- Please consider placing orders for Novolog Moderate Correction Scale/ SSI (0-15 units) TID AC + HS in addition to current insulin orders     --Will follow patient during hospitalization--  [name] RN, MSN, CDE Diabetes Coordinator Inpatient Glycemic Control Team Team Pager: [phone] (8a-5p)

## 2017-03-16 NOTE — BH Assessment (Addendum)
[facility] Assessment Progress Note  Per Thedore MinsMojeed Akintayo, MD, this pt requires psychiatric hospitalization at this time.  Claudia LimesLinsey Strader, RN, [facility] reports that a 300 hall bed will be available for this pt this afternoon, and she advises this writer to have pt sign consent forms.  Pt has signed Voluntary Admission and Consent for Treatment, as well as Consent to Release Information to her PCP, and [facility].  Pt's nurse, Claudia Erickson, has been notified, and agrees to send original paperwork along with pt via [facility] [place], and to call report to [phone] when the time comes.  Claudia Canninghomas Meleena Munroe, MA Triage Specialist [phone]   Addendum:  Claudia LimesLinsey Strader, RN, [facility] calls back to report that pt has been assigned to 305-1, and they will be ready to receive her at 15:30.  Pt's nurse, Claudia Erickson, has been notified.  Claudia Canninghomas Yavuz Kirby, MA Triage Specialist [phone]

## 2017-03-16 NOTE — Consult Note (Signed)
[facility] Face-to-Face Psychiatry Consult   Reason for Consult:  Depression and suicide plan Referring Physician:  EDP Patient Identification: Claudia Erickson MRN:  [id number] Principal Diagnosis: Major depressive disorder, recurrent severe without psychotic features [facility]) Diagnosis:   Patient Active Problem List   Diagnosis Date Noted  . Cocaine abuse [F14.10] 03/16/2017    Priority: High  . Major depressive disorder, recurrent severe without psychotic features ([place]) [F33.2] 08/27/2014    Priority: High  . Tobacco abuse [Z72.0] 02/02/2017  . Panic attack [F41.0]   . Chronic venous insufficiency [I87.2] 10/28/2016  . Hyponatremia [E87.1] 10/28/2016  . Hyperglycemia [R73.9]   . Intentional drug overdose ([place]) [T50.902A]   . Suicidal ideation [R45.851]   . Urinary frequency [R35.0] 04/08/2015  . Anxiety and depression [F41.9, F32.9] 12/26/2014  . Insomnia [G47.00] 12/26/2014  . Aspiration into airway [T17.908A] 12/06/2013  . Aspiration pneumonia ([place]) [J69.0] 12/06/2013  . Polysubstance abuse [F19.10] 12/06/2013  . Diabetes mellitus ([place]) [E11.9] 12/06/2013  . Syncope [R55] 12/06/2013  . Leucocytosis [D72.829] 12/06/2013  . Acute respiratory failure ([place]) [J96.00] 12/06/2013  . Elevated LFTs [R79.89] 12/06/2013    Total Time spent with patient: 45 minutes  Subjective:   Claudia Erickson is a 51 y.o. female patient admitted with suicide plan.  HPI:  51 yo female who presented to the ED with cocaine abuse and suicidal ideations with plan to overdose.  Endorses feelings of worthlessness and hopelessness.  Minimizes her drug use, frustrated with caring for her father who has dementia.  No homicidal ideations or hallucinations.  Decrease in energy, irritable.    Past Psychiatric History: depression, cocaine abuse  Risk to Self: Is patient at risk for suicide?: Yes Risk to Others:  no Prior Inpatient Therapy:  [facility] in Jan [facility] Outpatient [facility]  Past Medical  History:  Past Medical History:  Diagnosis Date  . Anxiety and depression   . Chronic back pain   . Diabetes mellitus without complication ([place])   . GERD (gastroesophageal reflux disease)   . Hypertension   . Panic attack 1990s  . Peripheral neuropathy   . Positive TB test   . Substance abuse   . Substance induced mood disorder ([place])   . UTI (lower urinary tract infection)     Past Surgical History:  Procedure Laterality Date  . ABDOMINAL HYSTERECTOMY    . CHOLECYSTECTOMY    . ROTATOR CUFF REPAIR    . TONSILLECTOMY     Family History:  Family History  Problem Relation Age of Onset  . Diabetes Mellitus II Father   . Hyperlipidemia Father   . Diabetes Father   . Colon cancer Father 82       not sure of age he was diagnosed  . Arthritis Mother   . Hypertension Mother   . Heart disease Mother   . Drug abuse Son        incarcerated with drug issues  . CAD Neg Hx    Family Psychiatric  History: unknown Social History:  History  Alcohol Use No     History  Drug Use  . Types: Cocaine    Comment: Heroine--only used briefly before.    Social History   Social History  . Marital status: Single    Spouse name: N/A  . Number of children: 2  . Years of education: 14   Occupational History  . 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  Smoking status: Current Every Day Smoker    Packs/day: 0.50    Years: 30.00    Types: Cigarettes  . Smokeless tobacco: Never Used  . Alcohol use No  . Drug use: Yes    Types: Cocaine     Comment: Heroine--only used briefly before.  Marland Kitchen Sexual activity: Not Asked   Other Topics Concern  . None   Social History Narrative   Born and raised in [place], [place].   Fun: Doesn't do a thing - never leaves the house.   Denies religious beliefs affecting health care.    Recently moved in with father for caregiver purposes   Not in touch with older son.   Younger son incarcerated for  drug related reasons.   Additional Social History:    Allergies:  No Known Allergies  Labs:  Results for orders placed or performed during the hospital encounter of 03/15/17 (from the past 48 hour(s))  Rapid urine drug screen (hospital performed)     Status: Abnormal   Collection Time: 03/15/17  9:17 PM  Result Value Ref Range   Opiates NONE DETECTED NONE DETECTED   Cocaine POSITIVE (A) NONE DETECTED   Benzodiazepines NONE DETECTED NONE DETECTED   Amphetamines NONE DETECTED NONE DETECTED   Tetrahydrocannabinol NONE DETECTED NONE DETECTED   Barbiturates NONE DETECTED NONE DETECTED    Comment:        DRUG SCREEN FOR MEDICAL PURPOSES ONLY.  IF CONFIRMATION IS NEEDED FOR ANY PURPOSE, NOTIFY LAB WITHIN 5 DAYS.        LOWEST DETECTABLE LIMITS FOR URINE DRUG SCREEN Drug Class       Cutoff (ng/mL) Amphetamine      1000 Barbiturate      200 Benzodiazepine   466 Tricyclics       599 Opiates          300 Cocaine          300 THC              50   Comprehensive metabolic panel     Status: Abnormal   Collection Time: 03/15/17  9:47 PM  Result Value Ref Range   Sodium 133 (L) 135 - 145 mmol/L   Potassium 4.0 3.5 - 5.1 mmol/L   Chloride 99 (L) 101 - 111 mmol/L   CO2 21 (L) 22 - 32 mmol/L   Glucose, Bld 336 (H) 65 - 99 mg/dL   BUN 17 6 - 20 mg/dL   Creatinine, Ser 0.75 0.44 - 1.00 mg/dL   Calcium 9.4 8.9 - 10.3 mg/dL   Total Protein 6.8 6.5 - 8.1 g/dL   Albumin 3.7 3.5 - 5.0 g/dL   AST 21 15 - 41 U/L   ALT 25 14 - 54 U/L   Alkaline Phosphatase 71 38 - 126 U/L   Total Bilirubin 0.5 0.3 - 1.2 mg/dL   GFR calc non Af Amer >60 >60 mL/min   GFR calc Af Amer >60 >60 mL/min    Comment: (NOTE) The eGFR has been calculated using the CKD EPI equation. This calculation has not been validated in all clinical situations. eGFR's persistently <60 mL/min signify possible Chronic Kidney Disease.    Anion gap 13 5 - 15  Ethanol     Status: None   Collection Time: 03/15/17  9:47 PM   Result Value Ref Range   Alcohol, Ethyl (B) <5 <5 mg/dL    Comment:        LOWEST DETECTABLE LIMIT FOR SERUM ALCOHOL IS  5 mg/dL FOR MEDICAL PURPOSES ONLY   Salicylate level     Status: None   Collection Time: 03/15/17  9:47 PM  Result Value Ref Range   Salicylate Lvl <6.8 2.8 - 30.0 mg/dL  Acetaminophen level     Status: Abnormal   Collection Time: 03/15/17  9:47 PM  Result Value Ref Range   Acetaminophen (Tylenol), Serum <10 (L) 10 - 30 ug/mL    Comment:        THERAPEUTIC CONCENTRATIONS VARY SIGNIFICANTLY. A RANGE OF 10-30 ug/mL MAY BE AN EFFECTIVE CONCENTRATION FOR MANY PATIENTS. HOWEVER, SOME ARE BEST TREATED AT CONCENTRATIONS OUTSIDE THIS RANGE. ACETAMINOPHEN CONCENTRATIONS >150 ug/mL AT 4 HOURS AFTER INGESTION AND >50 ug/mL AT 12 HOURS AFTER INGESTION ARE OFTEN ASSOCIATED WITH TOXIC REACTIONS.   cbc     Status: Abnormal   Collection Time: 03/15/17  9:47 PM  Result Value Ref Range   WBC 13.5 (H) 4.0 - 10.5 K/uL   RBC 5.06 3.87 - 5.11 MIL/uL   Hemoglobin 14.9 12.0 - 15.0 g/dL   HCT 43.4 36.0 - 46.0 %   MCV 85.8 78.0 - 100.0 fL   MCH 29.4 26.0 - 34.0 pg   MCHC 34.3 30.0 - 36.0 g/dL   RDW 13.3 11.5 - 15.5 %   Platelets 261 150 - 400 K/uL  Pregnancy, urine     Status: None   Collection Time: 03/15/17  9:47 PM  Result Value Ref Range   Preg Test, Ur NEGATIVE NEGATIVE    Comment:        THE SENSITIVITY OF THIS METHODOLOGY IS >20 mIU/mL.     Current Facility-Administered Medications  Medication Dose Route Frequency Provider Last Rate Last Dose  . acetaminophen (TYLENOL) tablet 650 mg  650 mg Oral Q4H PRN Daleen Bo, MD      . ARIPiprazole (ABILIFY) tablet 5 mg  5 mg Oral Daily Daleen Bo, MD      . citalopram (CELEXA) tablet 20 mg  20 mg Oral Daily Daleen Bo, MD      . gabapentin (NEURONTIN) capsule 300 mg  300 mg Oral TID Patrecia Pour, NP      . hydrochlorothiazide (HYDRODIURIL) tablet 25 mg  25 mg Oral Daily Daleen Bo, MD      .  insulin aspart protamine- aspart (NOVOLOG MIX 70/30) injection 30 Units  30 Units Subcutaneous Q supper Daleen Bo, MD      . insulin aspart protamine- aspart (NOVOLOG MIX 70/30) injection 40 Units  40 Units Subcutaneous Q breakfast Daleen Bo, MD   40 Units at 03/16/17 0847  . metFORMIN (GLUCOPHAGE) tablet 850 mg  850 mg Oral BID WC Daleen Bo, MD   850 mg at 03/16/17 0847  . mirtazapine (REMERON) tablet 7.5 mg  7.5 mg Oral QHS Daleen Bo, MD   7.5 mg at 03/16/17 0111  . nicotine (NICODERM CQ - dosed in mg/24 hours) patch 21 mg  21 mg Transdermal Daily Daleen Bo, MD      . ondansetron [facility] An) tablet 4 mg  4 mg Oral Q8H PRN Daleen Bo, MD      . pantoprazole (PROTONIX) EC tablet 40 mg  40 mg Oral Daily Daleen Bo, MD       Current Outpatient Prescriptions  Medication Sig Dispense Refill  . ARIPiprazole (ABILIFY) 5 MG tablet Take 1 tablet (5 mg total) by mouth daily. For mood stability 30 tablet 11  . citalopram (CELEXA) 20 MG tablet 1 tab by mouth daily for depression 30  tablet 11  . gabapentin (NEURONTIN) 100 MG capsule 4 caps by mouth 3 times daily for agitation/diabetic neuropathy 360 capsule 11  . hydrochlorothiazide (HYDRODIURIL) 25 MG tablet Take 1 tablet (25 mg total) by mouth daily. 30 tablet 11  . insulin aspart protamine- aspart (NOVOLOG MIX 70/30) (70-30) 100 UNIT/ML injection Inject 0.3 mLs (30 Units total) into the skin daily with supper. 10 mL 11  . insulin aspart protamine- aspart (NOVOLOG MIX 70/30) (70-30) 100 UNIT/ML injection Inject 0.4 mLs (40 Units total) into the skin daily with breakfast. 10 mL 11  . loratadine (CLARITIN) 10 MG tablet Take 10 mg by mouth daily.    Marland Kitchen LORazepam (ATIVAN) 1 MG tablet Take 1 tablet (1 mg total) by mouth every 8 (eight) hours as needed for anxiety. 12 tablet 0  . meloxicam (MOBIC) 15 MG tablet Take 1 tablet (15 mg total) by mouth daily. 30 tablet 11  . metFORMIN (GLUCOPHAGE) 850 MG tablet Take 1 tablet (850 mg total)  by mouth 2 (two) times daily with a meal. For diabetes management 60 tablet 11  . mirtazapine (REMERON) 7.5 MG tablet Take 1 tablet (7.5 mg total) by mouth at bedtime. For insomnia 30 tablet 11  . omeprazole (PRILOSEC OTC) 20 MG tablet Take 1 tablet (20 mg total) by mouth daily. For acid reflux 30 tablet 11    Musculoskeletal: Strength & Muscle Tone: within normal limits Gait & Station: normal Patient leans: N/A  Psychiatric Specialty Exam: Physical Exam  Constitutional: She is oriented to person, place, and time. She appears well-developed and well-nourished.  HENT:  Head: Normocephalic.  Neck: Normal range of motion.  Respiratory: Effort normal.  Musculoskeletal: Normal range of motion.  Neurological: She is alert and oriented to person, place, and time.  Psychiatric: Her speech is normal and behavior is normal. Judgment normal. Cognition and memory are normal. She exhibits a depressed mood. She expresses suicidal ideation. She expresses suicidal plans.    Review of Systems  Psychiatric/Behavioral: Positive for depression, substance abuse and suicidal ideas.  All other systems reviewed and are negative.   Blood pressure 107/70, pulse 74, temperature 98.2 F (36.8 C), temperature source Oral, resp. rate 20, height 5' 9"  (1.753 m), weight 136.1 kg (300 lb), SpO2 98 %.Body mass index is 44.3 kg/m.  General Appearance: Casual  Eye Contact:  Fair  Speech:  Normal Rate  Volume:  Normal  Mood:  Depressed  Affect:  Congruent  Thought Process:  Coherent and Descriptions of Associations: Intact  Orientation:  Full (Time, Place, and Person)  Thought Content:  Rumination  Suicidal Thoughts:  Yes.  with intent/plan  Homicidal Thoughts:  No  Memory:  Immediate;   Fair Recent;   Fair Remote;   Fair  Judgement:  Fair  Insight:  Fair  Psychomotor Activity:  Decreased  Concentration:  Concentration: Fair and Attention Span: Fair  Recall:  [employer] of Knowledge:  Fair  Language:   Good  Akathisia:  No  Handed:  Right  AIMS (if indicated):     Assets:  Housing Leisure Time Physical Health Resilience Social Support  ADL's:  Intact  Cognition:  WNL  Sleep:        Treatment Plan Summary: Daily contact with patient to assess and evaluate symptoms and progress in treatment, Medication management and Plan major depressive disorder, recurrent, severe without psychosis:  -Crisis stabilization -Medication management:  Restarted medical medications along with Gabapentin 300 mg TID for withdrawal symptoms, Abilify 5 mg daily for  depression, and Remeron 7.5 mg at bedtime for sleep -Individual and substance abuse counseling -[facility]  Disposition: Recommend psychiatric Inpatient admission when medically cleared.  Waylan Boga, NP 03/16/2017 11:04 AM  Patient seen face-to-face for psychiatric evaluation, chart reviewed and case discussed with the physician extender and developed treatment plan. Reviewed the information documented and agree with the treatment plan. Corena Pilgrim, MD

## 2017-03-16 NOTE — ED Notes (Signed)
Provided patient a turkey sandwich.  

## 2017-03-17 DIAGNOSIS — F1410 Cocaine abuse, uncomplicated: Secondary | ICD-10-CM

## 2017-03-17 DIAGNOSIS — F332 Major depressive disorder, recurrent severe without psychotic features: Principal | ICD-10-CM

## 2017-03-17 DIAGNOSIS — Z810 Family history of intellectual disabilities: Secondary | ICD-10-CM

## 2017-03-17 DIAGNOSIS — F17210 Nicotine dependence, cigarettes, uncomplicated: Secondary | ICD-10-CM

## 2017-03-17 DIAGNOSIS — Z813 Family history of other psychoactive substance abuse and dependence: Secondary | ICD-10-CM

## 2017-03-17 LAB — GLUCOSE, CAPILLARY
Glucose-Capillary: 238 mg/dL — ABNORMAL HIGH (ref 65–99)
Glucose-Capillary: 326 mg/dL — ABNORMAL HIGH (ref 65–99)

## 2017-03-17 MED ORDER — LORAZEPAM 0.5 MG PO TABS
0.5000 mg | ORAL_TABLET | Freq: Four times a day (QID) | ORAL | Status: DC | PRN
  Administered 2017-03-17 – 2017-03-22 (×14): 0.5 mg via ORAL
  Filled 2017-03-17 (×14): qty 1

## 2017-03-17 NOTE — H&P (Signed)
Psychiatric Admission Assessment Adult  Patient Identification: Claudia Erickson MRN:  [id number] Date of Evaluation:  03/17/2017 Chief Complaint:  Worsening depression and anxiety Principal Diagnosis: MDD Diagnosis:   Patient Active Problem List   Diagnosis Date Noted  . Cocaine abuse [F14.10] 03/16/2017  . Tobacco abuse [Z72.0] 02/02/2017  . Panic attack [F41.0]   . Chronic venous insufficiency [I87.2] 10/28/2016  . Hyponatremia [E87.1] 10/28/2016  . Hyperglycemia [R73.9]   . Intentional drug overdose (HCC) [T50.902A]   . Suicidal ideation [R45.851]   . Urinary frequency [R35.0] 04/08/2015  . Anxiety and depression [F41.9, F32.9] 12/26/2014  . Insomnia [G47.00] 12/26/2014  . Major depressive disorder, recurrent severe without psychotic features (HCC) [F33.2] 08/27/2014  . Aspiration into airway [T17.908A] 12/06/2013  . Aspiration pneumonia (HCC) [J69.0] 12/06/2013  . Polysubstance abuse [F19.10] 12/06/2013  . Diabetes mellitus (HCC) [E11.9] 12/06/2013  . Syncope [R55] 12/06/2013  . Leucocytosis [D72.829] 12/06/2013  . Acute respiratory failure (HCC) [J96.00] 12/06/2013  . Elevated LFTs [R79.89] 12/06/2013   History of Present Illness: 51 year old female. She is known to our unit from prior psychiatric admission. She had been admitted on 10/2016. At the time presented for depression, anxiety and overdosing on Trazodone. She was discharged on Abilify,Celexa, Neurontin , Remeron.  She presented to ED voluntarily . Reports worsening depression, worsening anxiety, suicidal ideations, with thoughts of carbon monoxide poisoning. Reports worsening neuro-vegetative symptoms,as below, as well as worsening anxiety, which she describes both as panic attacks, agoraphobia,and a sense of apprehension. States she had stopped her psychiatric medications about one month ago, after running out . Reports history of cocaine abuse, states she has been using more irregularly, sometimes " going weeks or  months without using". States she has been using more often over recent days.  Associated Signs/Symptoms: Depression Symptoms:  depressed mood, anhedonia, insomnia, suicidal thoughts with specific plan, loss of energy/fatigue, decreased appetite,  Increased social isolation  (Hypo) Manic Symptoms:  Denies  Anxiety Symptoms:  Reports increased frequency of panic attacks, as well as persistent sense of anxiety, apprehension Psychotic Symptoms:  denies  PTSD Symptoms: denies Total Time spent with patient: 45 minutes  Past Psychiatric History: one prior psychiatric admission in January of 2018. History of prior suicide attempts by overdosing . Denies history of self cutting, no history of mania or hypomania, no history of psychosis, denies PTSD history , endorses long history of panic disorder, endorses agoraphobia. Denies history of violence .   Is the patient at risk to self? Yes.    Has the patient been a risk to self in the past 6 months? Yes.    Has the patient been a risk to self within the distant past? Yes.    Is the patient a risk to others? No.  Has the patient been a risk to others in the past 6 months? No.  Has the patient been a risk to others within the distant past? No.   Prior Inpatient Therapy:  as above  Prior Outpatient Therapy:  was following at [facility]   Alcohol Screening: 1. How often do you have a drink containing alcohol?: Never 9. Have you or someone else been injured as a result of your drinking?: No 10. Has a relative or friend or a doctor or another health worker been concerned about your drinking or suggested you cut down?: No Alcohol Use Disorder Identification Test Final Score (AUDIT): 0 Brief Intervention: AUDIT score less than 7 or less-screening does not suggest unhealthy drinking-brief intervention  not indicated Substance Abuse History in the last 12 months:  Denies history of alcohol abuse, endorses history of cocaine use disorder   Consequences of Substance Abuse: Drug related charges in the past   Previous Psychotropic Medications:  Most recently prescribed  medications have been Abilify,Celexa, Neurontin , Remeron. States she stopped these medications a few weeks ago after she ran out  Psychological Evaluations:  No  Past Medical History:  Past Medical History:  Diagnosis Date  . Anxiety and depression   . Chronic back pain   . Diabetes mellitus without complication (HCC)   . GERD (gastroesophageal reflux disease)   . Hypertension   . Panic attack 1990s  . Peripheral neuropathy   . Positive TB test   . Substance abuse   . Substance induced mood disorder (HCC)   . UTI (lower urinary tract infection)     Past Surgical History:  Procedure Laterality Date  . ABDOMINAL HYSTERECTOMY    . CHOLECYSTECTOMY    . ROTATOR CUFF REPAIR    . TONSILLECTOMY     Family History: Father is alive, has Dementia, Mother passed away 2016/06/17. Has one sister .   Family History  Problem Relation Age of Onset  . Diabetes Mellitus II Father   . Hyperlipidemia Father   . Diabetes Father   . Colon cancer Father 5       not sure of age he was diagnosed  . Arthritis Mother   . Hypertension Mother   . Heart disease Mother   . Drug abuse Son        incarcerated with drug issues  . CAD Neg Hx    Family Psychiatric  History: states there is a strong history of alcohol use disorder in family, no suicides in family Tobacco Screening: Have you used any form of tobacco in the last 30 days? (Cigarettes, Smokeless Tobacco, Cigars, and/or Pipes): Yes Tobacco use, Select all that apply: 5 or more cigarettes per day Are you interested in Tobacco Cessation Medications?: No, patient refused Counseled patient on smoking cessation including recognizing danger situations, developing coping skills and basic information about quitting provided: Yes  She smokes 1/2 PPD  Social History: divorced, has 2 adult sons, at this time patient is living  with her father, who has dementia. Currently unemployed, no source of income. Denies legal issues at this time. History  Alcohol Use No     History  Drug Use  . Types: Cocaine    Comment: Heroine--only used briefly before.    Additional Social History: Marital status: Divorced Divorced, when?: 2002 What types of issues is patient dealing with in the relationship?: None  Additional relationship information: no relationships at present Are you sexually active?: Yes What is your sexual orientation?: Heterosexual  Has your sexual activity been affected by drugs, alcohol, medication, or emotional stress?: No  Does patient have children?: Yes How many children?: 2 How is patient's relationship with their children?: Patient reported that she is very close with her youngest son. She also reported that she does not have any contact with her oldest son.   Allergies:  No Known Allergies Lab Results:  Results for orders placed or performed during the hospital encounter of 03/16/17 (from the past 48 hour(s))  Glucose, capillary     Status: Abnormal   Collection Time: 03/16/17  6:20 PM  Result Value Ref Range   Glucose-Capillary 258 (H) 65 - 99 mg/dL  Glucose, capillary     Status: Abnormal   Collection Time:  03/17/17  6:38 AM  Result Value Ref Range   Glucose-Capillary 238 (H) 65 - 99 mg/dL  Glucose, capillary     Status: Abnormal   Collection Time: 03/17/17  4:58 PM  Result Value Ref Range   Glucose-Capillary 326 (H) 65 - 99 mg/dL    Blood Alcohol level:  Lab Results  Component Value Date   ETH <5 03/15/2017   ETH <5 10/26/2016    Metabolic Disorder Labs:  Lab Results  Component Value Date   HGBA1C 12.0 (H) 10/29/2016   MPG 298 10/29/2016   MPG 278 (H) 12/06/2013   No results found for: PROLACTIN No results found for: CHOL, TRIG, HDL, CHOLHDL, VLDL, LDLCALC  Current Medications: Current Facility-Administered Medications  Medication Dose Route Frequency Provider Last Rate  Last Dose  . acetaminophen (TYLENOL) tablet 650 mg  650 mg Oral Q4H PRN Charm Rings, NP   650 mg at 03/16/17 2122  . alum & mag hydroxide-simeth (MAALOX/MYLANTA) 200-200-20 MG/5ML suspension 30 mL  30 mL Oral Q4H PRN Charm Rings, NP      . ARIPiprazole (ABILIFY) tablet 5 mg  5 mg Oral Daily Charm Rings, NP   5 mg at 03/17/17 1045  . citalopram (CELEXA) tablet 20 mg  20 mg Oral Daily Charm Rings, NP   20 mg at 03/17/17 1045  . gabapentin (NEURONTIN) capsule 300 mg  300 mg Oral TID Charm Rings, NP   300 mg at 03/17/17 1655  . hydrochlorothiazide (HYDRODIURIL) tablet 25 mg  25 mg Oral Daily Charm Rings, NP   25 mg at 03/17/17 1043  . hydrOXYzine (ATARAX/VISTARIL) tablet 25 mg  25 mg Oral Q6H PRN Cobos, Rockey Situ, MD   25 mg at 03/17/17 1046  . insulin aspart protamine- aspart (NOVOLOG MIX 70/30) injection 30 Units  30 Units Subcutaneous Q supper Charm Rings, NP   30 Units at 03/17/17 1702  . insulin aspart protamine- aspart (NOVOLOG MIX 70/30) injection 40 Units  40 Units Subcutaneous Q breakfast Lord, Jamison Y, NP      . magnesium hydroxide (MILK OF MAGNESIA) suspension 30 mL  30 mL Oral Daily PRN Charm Rings, NP      . metFORMIN (GLUCOPHAGE) tablet 850 mg  850 mg Oral BID WC Charm Rings, NP   850 mg at 03/17/17 1655  . mirtazapine (REMERON) tablet 7.5 mg  7.5 mg Oral QHS Charm Rings, NP   7.5 mg at 03/16/17 2122  . nicotine (NICODERM CQ - dosed in mg/24 hours) patch 21 mg  21 mg Transdermal Daily Charm Rings, NP   21 mg at 03/17/17 1043  . ondansetron (ZOFRAN) tablet 4 mg  4 mg Oral Q8H PRN Charm Rings, NP      . pantoprazole (PROTONIX) EC tablet 40 mg  40 mg Oral Daily Charm Rings, NP   40 mg at 03/17/17 1044   PTA Medications: Prescriptions Prior to Admission  Medication Sig Dispense Refill Last Dose  . ARIPiprazole (ABILIFY) 5 MG tablet Take 1 tablet (5 mg total) by mouth daily. For mood stability 30 tablet 11 More than a month at Unknown  time  . citalopram (CELEXA) 20 MG tablet 1 tab by mouth daily for depression 30 tablet 11 More than a month at Unknown time  . gabapentin (NEURONTIN) 100 MG capsule 4 caps by mouth 3 times daily for agitation/diabetic neuropathy 360 capsule 11 More than a month at Unknown time  .  hydrochlorothiazide (HYDRODIURIL) 25 MG tablet Take 1 tablet (25 mg total) by mouth daily. 30 tablet 11 More than a month at Unknown time  . insulin aspart protamine- aspart (NOVOLOG MIX 70/30) (70-30) 100 UNIT/ML injection Inject 0.3 mLs (30 Units total) into the skin daily with supper. 10 mL 11 More than a month at Unknown time  . insulin aspart protamine- aspart (NOVOLOG MIX 70/30) (70-30) 100 UNIT/ML injection Inject 0.4 mLs (40 Units total) into the skin daily with breakfast. 10 mL 11 More than a month at Unknown time  . loratadine (CLARITIN) 10 MG tablet Take 10 mg by mouth daily.   More than a month at Unknown time  . LORazepam (ATIVAN) 1 MG tablet Take 1 tablet (1 mg total) by mouth every 8 (eight) hours as needed for anxiety. 12 tablet 0 More than a month at Unknown time  . meloxicam (MOBIC) 15 MG tablet Take 1 tablet (15 mg total) by mouth daily. 30 tablet 11 More than a month at Unknown time  . metFORMIN (GLUCOPHAGE) 850 MG tablet Take 1 tablet (850 mg total) by mouth 2 (two) times daily with a meal. For diabetes management 60 tablet 11 More than a month at Unknown time  . mirtazapine (REMERON) 7.5 MG tablet Take 1 tablet (7.5 mg total) by mouth at bedtime. For insomnia 30 tablet 11 More than a month at Unknown time  . omeprazole (PRILOSEC OTC) 20 MG tablet Take 1 tablet (20 mg total) by mouth daily. For acid reflux 30 tablet 11 More than a month at Unknown time    Musculoskeletal: Strength & Muscle Tone: within normal limits Gait & Station: normal Patient leans: N/A  Psychiatric Specialty Exam: Physical Exam  Review of Systems  Constitutional: Negative.   HENT: Negative.   Eyes: Negative.   Respiratory:  Negative.   Cardiovascular: Negative.   Gastrointestinal: Negative.   Genitourinary: Negative.   Musculoskeletal: Negative.   Skin: Negative.   Neurological: Negative for seizures.  Endo/Heme/Allergies: Negative.   Psychiatric/Behavioral: Positive for depression, substance abuse and suicidal ideas.  All other systems reviewed and are negative.   Blood pressure 106/63, pulse 96, temperature 99 F (37.2 C), temperature source Oral, resp. rate 18, height 5\' 9"  (1.753 m), weight (!) 142 kg (313 lb), SpO2 97 %.Body mass index is 46.22 kg/m.  General Appearance: Fairly Groomed  Eye Contact:  Good  Speech:  Normal Rate  Volume:  Decreased  Mood:  depressed   Affect:  constricted, anxious  Thought Process:  Linear and Descriptions of Associations: Intact  Orientation:  Full (Time, Place, and Person)  Thought Content:  no hallucinations, no delusions   Suicidal Thoughts:  No- denies any current suicidal or self injurious ideations and contracts for safety on unit, denies any homicidal ideations  Homicidal Thoughts:  No  Memory:  recent and remote grossly intact   Judgement:  Fair  Insight:  Fair  Psychomotor Activity:  Decreased  Concentration:  Concentration: Good and Attention Span: Good  Recall:  Good  Fund of Knowledge:  Good  Language:  Good  Akathisia:  Negative  Handed:  Right  AIMS (if indicated):     Assets:  Desire for Improvement Resilience  ADL's:  Intact  Cognition:  WNL  Sleep:  Number of Hours: 6.75    Treatment Plan Summary: Daily contact with patient to assess and evaluate symptoms and progress in treatment, Medication management, Plan inpatient admission and medications as below  Observation Level/Precautions:  15 minute checks  Laboratory:  as needed  Psychotherapy:  Milieu, group treatment   Medications: Continue Neurontin 300 mgrs TID, Celexa 20 mgrs QDAY, Discontinue Abilify, as she states she does not take it due to its price " I cannot afford it",  Continue Remeron 7.5 mgrs QHS    Consultations:  As needed   Discharge Concerns:  -  Estimated LOS: 5-6 days  Other:     Physician Treatment Plan for Primary Diagnosis: MDD  Long Term Goal(s): Improvement in symptoms so as ready for discharge  Short Term Goals: Ability to verbalize feelings will improve, Ability to disclose and discuss suicidal ideas, Ability to demonstrate self-control will improve, Ability to identify and develop effective coping behaviors will improve and Ability to maintain clinical measurements within normal limits will improve  Physician Treatment Plan for Secondary Diagnosis: Cocaine Use Disorder  Long Term Goal(s): Improvement in symptoms so as ready for discharge  Short Term Goals: Ability to identify triggers associated with substance abuse/mental health issues will improve  I certify that inpatient services furnished can reasonably be expected to improve the patient's condition.    Craige Cotta, MD 6/14/20186:55 PM

## 2017-03-17 NOTE — Progress Notes (Signed)
Patient ID: Claudia Erickson, female   DOB: 03/22/1966, 51 y.o.   MRN: [id number]  D: Patient denies SI/HI and auditory and visual hallucinations. Patient has a depressed mood and affect. Appears to be vested in treatment and wants to feel better and be happier. Patient states that she is under a tremendous amount of stress caring for her father who has dementia.  A: Patient given emotional support from RN. Patient given medications per MD orders. Patient encouraged to attend groups and unit activities. Patient encouraged to come to staff with any questions or concerns.  R: Patient remains cooperative and appropriate. Will continue to monitor patient for safety.
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
## 2017-03-17 NOTE — Progress Notes (Signed)
Inpatient Diabetes Program Recommendations  AACE/ADA: New Consensus Statement on Inpatient Glycemic Control (2015)  Target Ranges:  Prepandial:   less than 140 mg/dL      Peak postprandial:   less than 180 mg/dL (1-2 hours)      Critically ill patients:  140 - 180 mg/dL   Results for Claudia Erickson, Claudia Erickson (MRN [id number]) as of 03/17/2017 07:33  Ref. Range 03/16/2017 18:20 03/17/2017 06:38  Glucose-Capillary Latest Ref Range: 65 - 99 mg/dL 409258 (H) 811238 (H)    To [facility] ED: Depression and anxiety, Suicidal Thoughts  History: DM  Home DM Meds: Metformin 850 mg BID                             70/30 Insulin: 40 units AM/ 30 units PM  Current Orders: Metformin 850 mg BID                            70/30 Insulin: 40 units AM/ 30 units PM     MD- Please consider placing orders for Novolog Moderate Correction Scale/ SSI (0-15 units) TID AC + HS in addition to current insulin orders     --Will follow patient during hospitalization--  Ambrose FinlandJeannine Johnston Derrico Zhong RN, MSN, CDE Diabetes Coordinator Inpatient Glycemic Control Team Team Pager: [phone] (8a-5p)

## 2017-03-17 NOTE — [facility] Suicide Risk Assessment (Addendum)
[facility] INPATIENT:  Family/Significant Other Suicide Prevention Education  Suicide Prevention Education:  Contact Attempts: father, Ricki RodriguezGene Erickson [phone], (name of family member/significant other) has been identified by the patient as the family member/significant other with whom the patient will be residing, and identified as the person(s) who will aid the patient in the event of a mental health crisis.  With written consent from the patient, two attempts were made to provide suicide prevention education, prior to and/or following the patient's discharge.  We were unsuccessful in providing suicide prevention education.  A suicide education pamphlet was given to the patient to share with family/significant other.  Date and time of first attempt: 03/17/17 at 4:45 PM, no answer, left VM Date and time of second attempt:  03/18/17 at 10: 45 AM, no answer, left VM   [name] [name] 03/17/2017, 4:48 PM

## 2017-03-17 NOTE — Tx Team (Signed)
Interdisciplinary Treatment and Diagnostic Plan Update  03/18/2017 Time of Session:  Claudia Erickson MRN: [id number]  Principal Diagnosis: Major Depressive Disorder  Secondary Diagnoses: Active Problems:   Major depressive disorder, recurrent severe without psychotic features (HCC)   Current Medications:  Current Facility-Administered Medications  Medication Dose Route Frequency Provider Last Rate Last Dose  . acetaminophen (TYLENOL) tablet 650 mg  650 mg Oral Q4H PRN Charm RingsLord, Jamison Y, NP   650 mg at 03/16/17 2122  . alum & mag hydroxide-simeth (MAALOX/MYLANTA) 200-200-20 MG/5ML suspension 30 mL  30 mL Oral Q4H PRN Charm RingsLord, Jamison Y, NP      . citalopram (CELEXA) tablet 20 mg  20 mg Oral Daily Charm RingsLord, Jamison Y, NP   20 mg at 03/18/17 0839  . gabapentin (NEURONTIN) capsule 400 mg  400 mg Oral TID Cobos, Fernando A, MD      . hydrochlorothiazide (HYDRODIURIL) tablet 25 mg  25 mg Oral Daily Charm RingsLord, Jamison Y, NP   25 mg at 03/18/17 0839  . insulin aspart protamine- aspart (NOVOLOG MIX 70/30) injection 30 Units  30 Units Subcutaneous Q supper Charm RingsLord, Jamison Y, NP   30 Units at 03/17/17 1702  . insulin aspart protamine- aspart (NOVOLOG MIX 70/30) injection 40 Units  40 Units Subcutaneous Q breakfast Charm RingsLord, Jamison Y, NP   40 Units at 03/18/17 0844  . LORazepam (ATIVAN) tablet 0.5 mg  0.5 mg Oral Q6H PRN Cobos, Rockey SituFernando A, MD   0.5 mg at 03/18/17 1258  . magnesium hydroxide (MILK OF MAGNESIA) suspension 30 mL  30 mL Oral Daily PRN Charm RingsLord, Jamison Y, NP      . metFORMIN (GLUCOPHAGE) tablet 850 mg  850 mg Oral BID WC Charm RingsLord, Jamison Y, NP   850 mg at 03/18/17 [id number]  . mirtazapine (REMERON) tablet 7.5 mg  7.5 mg Oral QHS Charm RingsLord, Jamison Y, NP   7.5 mg at 03/17/17 2122  . nicotine (NICODERM CQ - dosed in mg/24 hours) patch 21 mg  21 mg Transdermal Daily Charm RingsLord, Jamison Y, NP   21 mg at 03/18/17 0841  . ondansetron (ZOFRAN) tablet 4 mg  4 mg Oral Q8H PRN Charm RingsLord, Jamison Y, NP      . pantoprazole (PROTONIX) EC tablet 40  mg  40 mg Oral Daily Charm RingsLord, Jamison Y, NP   40 mg at 03/18/17 [id number]   PTA Medications: Prescriptions Prior to Admission  Medication Sig Dispense Refill Last Dose  . ARIPiprazole (ABILIFY) 5 MG tablet Take 1 tablet (5 mg total) by mouth daily. For mood stability 30 tablet 11 More than a month at Unknown time  . citalopram (CELEXA) 20 MG tablet 1 tab by mouth daily for depression 30 tablet 11 More than a month at Unknown time  . gabapentin (NEURONTIN) 100 MG capsule 4 caps by mouth 3 times daily for agitation/diabetic neuropathy 360 capsule 11 More than a month at Unknown time  . hydrochlorothiazide (HYDRODIURIL) 25 MG tablet Take 1 tablet (25 mg total) by mouth daily. 30 tablet 11 More than a month at Unknown time  . insulin aspart protamine- aspart (NOVOLOG MIX 70/30) (70-30) 100 UNIT/ML injection Inject 0.3 mLs (30 Units total) into the skin daily with supper. 10 mL 11 More than a month at Unknown time  . insulin aspart protamine- aspart (NOVOLOG MIX 70/30) (70-30) 100 UNIT/ML injection Inject 0.4 mLs (40 Units total) into the skin daily with breakfast. 10 mL 11 More than a month at Unknown time  . loratadine (CLARITIN) 10 MG tablet Take  10 mg by mouth daily.   More than a month at Unknown time  . LORazepam (ATIVAN) 1 MG tablet Take 1 tablet (1 mg total) by mouth every 8 (eight) hours as needed for anxiety. 12 tablet 0 More than a month at Unknown time  . meloxicam (MOBIC) 15 MG tablet Take 1 tablet (15 mg total) by mouth daily. 30 tablet 11 More than a month at Unknown time  . metFORMIN (GLUCOPHAGE) 850 MG tablet Take 1 tablet (850 mg total) by mouth 2 (two) times daily with a meal. For diabetes management 60 tablet 11 More than a month at Unknown time  . mirtazapine (REMERON) 7.5 MG tablet Take 1 tablet (7.5 mg total) by mouth at bedtime. For insomnia 30 tablet 11 More than a month at Unknown time  . omeprazole (PRILOSEC OTC) 20 MG tablet Take 1 tablet (20 mg total) by mouth daily. For acid reflux  30 tablet 11 More than a month at Unknown time    Patient Stressors: Medication change or noncompliance  Patient Strengths: Ability for insight Active sense of humor Average or above average intelligence General fund of knowledge Work skills  Treatment Modalities: Medication Management, Group therapy, Case management,  1 to 1 session with clinician, Psychoeducation, Recreational therapy.   Physician Treatment Plan for Primary Diagnosis: Major Depressive Disorder Long Term Goal(s): Improvement in symptoms so as ready for discharge Improvement in symptoms so as ready for discharge   Short Term Goals: Ability to verbalize feelings will improve Ability to disclose and discuss suicidal ideas Ability to demonstrate self-control will improve Ability to identify and develop effective coping behaviors will improve Ability to maintain clinical measurements within normal limits will improve Ability to identify triggers associated with substance abuse/mental health issues will improve  Medication Management: Evaluate patient's response, side effects, and tolerance of medication regimen.  Therapeutic Interventions: 1 to 1 sessions, Unit Group sessions and Medication administration.  Evaluation of Outcomes: Progressing  Physician Treatment Plan for Secondary Diagnosis: Active Problems:   Major depressive disorder, recurrent severe without psychotic features (HCC)  Long Term Goal(s): Improvement in symptoms so as ready for discharge Improvement in symptoms so as ready for discharge   Short Term Goals: Ability to verbalize feelings will improve Ability to disclose and discuss suicidal ideas Ability to demonstrate self-control will improve Ability to identify and develop effective coping behaviors will improve Ability to maintain clinical measurements within normal limits will improve Ability to identify triggers associated with substance abuse/mental health issues will improve      Medication Management: Evaluate patient's response, side effects, and tolerance of medication regimen.  Therapeutic Interventions: 1 to 1 sessions, Unit Group sessions and Medication administration.  Evaluation of Outcomes: Progressing   RN Treatment Plan for Primary Diagnosis: Major Depressive Disorder Long Term Goal(s): Knowledge of disease and therapeutic regimen to maintain health will improve  Short Term Goals: Ability to remain free from injury will improve, Ability to participate in decision making will improve and Ability to disclose and discuss suicidal ideas   Medication Management: RN will administer medications as ordered by provider, will assess and evaluate patient's response and provide education to patient for prescribed medication. RN will report any adverse and/or side effects to prescribing provider.  Therapeutic Interventions: 1 on 1 counseling sessions, Psychoeducation, Medication administration, Evaluate responses to treatment, Monitor vital signs and CBGs as ordered, Perform/monitor CIWA, COWS, AIMS and Fall Risk screenings as ordered, Perform wound care treatments as ordered.  Evaluation of Outcomes: Progressing  LCSW Treatment Plan for Primary Diagnosis: Major Depressive disorder Long Term Goal(s): Safe transition to appropriate next level of care at discharge, Engage patient in therapeutic group addressing interpersonal concerns.  Short Term Goals: Engage patient in aftercare planning with referrals and resources, Increase emotional regulation and Increase skills for wellness and recovery  Therapeutic Interventions: Assess for all discharge needs, 1 to 1 time with Social worker, Explore available resources and support systems, Assess for adequacy in community support network, Educate family and significant other(s) on suicide prevention, Complete Psychosocial Assessment, Interpersonal group therapy.  Evaluation of Outcomes: Progressing   Progress in  Treatment: Attending groups: Yes. Participating in groups: Yes. Taking medication as prescribed: Yes. Toleration medication: Yes. Family/Significant other contact made: Yes, individual(s) contacted:  father, Adele Dan Patient understands diagnosis: No. Discussing patient identified problems/goals with staff: Yes. Medical problems stabilized or resolved: Yes. Denies suicidal/homicidal ideation: Yes. and As evidenced by:  contract for safety on unit, coping skills inadequate for community stressors, admitted w suicidal ideation Issues/concerns per patient self-inventory: No. Other: NA  New problem(s) identified: Yes, Describe:  patient has limited finances and no insurance, has struggled to pay for treatment, will refer to providers who can assist; given resources for peer support and [facility], linked w inpt peer support  New Short Term/Long Term Goal(s): increase emotion regulation and ability to appropriately seek support for self; link w recovery oriented community support providers  Discharge Plan or Barriers: none at this time, return home to father and follow up w outpatient providers  Reason for Continuation of Hospitalization: Anxiety Depression Medication stabilization Suicidal ideation  Estimated Length of Stay: 3 - 5 days, tentative DC date of 03/22/17  Attendees: Patient:   Physician: Sallyanne Havers MD 03/17/17 9:30 AM  Nursing: Vesta Mixer RN 03/17/17 9:30 AM  RN Care Manager: Sondra Barges RN CM 03/17/17 9:30 AM  Social Worker: Governor Rooks LCSW 03/17/17 9:30 AM  Recreational Therapist:    Other:    Other:    Other:     Scribe for Treatment Team: Sallee Lange, LCSW 03/18/2017 4:26 PM
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
## 2017-03-17 NOTE — Progress Notes (Signed)
  DATA ACTION RESPONSE  Objective- Pt. is visible in the room, seen reading a book. Presents with a flat/depressed affect and mood. Appears vested in treatment. Bilat. ankle swelling r/t peripheral neuropathy.   Subjective- Denies having any SI/HI/AVH at this time. Rates pain 7/10; bilat foot.  Pt. states " I am just burnt out". Is cooperative and remain safe on the unit.  1:1 interaction in private to establish rapport. Encouragement, education, & support given from staff.  PRN Tylenol requested and will re-eval accordingly.   Safety maintained with Q 15 checks. Continue with POC.

## 2017-03-18 LAB — GLUCOSE, CAPILLARY
GLUCOSE-CAPILLARY: 259 mg/dL — AB (ref 65–99)
GLUCOSE-CAPILLARY: 341 mg/dL — AB (ref 65–99)

## 2017-03-18 MED ORDER — GABAPENTIN 400 MG PO CAPS
400.0000 mg | ORAL_CAPSULE | Freq: Three times a day (TID) | ORAL | Status: DC
  Administered 2017-03-18 – 2017-03-22 (×12): 400 mg via ORAL
  Filled 2017-03-18: qty 1
  Filled 2017-03-18: qty 21
  Filled 2017-03-18: qty 1
  Filled 2017-03-18: qty 21
  Filled 2017-03-18: qty 1
  Filled 2017-03-18: qty 21
  Filled 2017-03-18: qty 1
  Filled 2017-03-18: qty 21
  Filled 2017-03-18 (×5): qty 1
  Filled 2017-03-18: qty 21
  Filled 2017-03-18 (×5): qty 1
  Filled 2017-03-18: qty 21
  Filled 2017-03-18 (×3): qty 1

## 2017-03-18 NOTE — Plan of Care (Signed)
Problem: Medication: Goal: Compliance with prescribed medication regimen will improve Outcome: Progressing Pt has been compliant with scheduled medication tonight.    

## 2017-03-18 NOTE — Progress Notes (Signed)
Patient attended AA group meeting.  

## 2017-03-18 NOTE — Progress Notes (Signed)
D: Pt was in her room upon initial approach.  Pt presents with anxious, depressed affect and mood.  Her goal today was "to work on getting my meds straightened out and I was able to talk to the doctor."  Pt denies SI/HI, denies hallucinations, denies pain.  Pt has been visible in milieu interacting with peers and staff appropriately.  Pt attended evening group.    A: Introduced self to pt.  Actively listened to pt and offered support and encouragement. Medication administered per order.  PRN medication administered for anxiety.  Q15 minute safety checks maintained.  R: Pt is safe on the unit.  Pt is compliant with medications.  Pt verbally contracts for safety.  Will continue to monitor and assess.

## 2017-03-18 NOTE — Progress Notes (Signed)
Data. Patient denies SI/HI/AVH. Patient has spent most of her time, this shift in her room in bed. She has a flat/sullen affect and she has been irritable with staff and peers. Affect does not brighten. Patient reports her depression as 8/10, hopelessness as 7/10 and her anxiety as 10/10. Her goal is, "Learning to become motivated." Action. Emotional support and encouragement offered. Education provided on medication, indications and side effect. Q 15 minute checks done for safety. Response. Safety on the unit maintained through 15 minute checks.  Medications taken as prescribed. Remained calm through out shift.

## 2017-03-18 NOTE — [facility] Suicide Risk Assessment (Signed)
[facility] INPATIENT:  Family/Significant Other Suicide Prevention Education  Suicide Prevention Education:  Education Completed; Claudia Erickson, father, [phone],  (name of family member/significant other) has been identified by the patient as the family member/significant other with whom the patient will be residing, and identified as the person(s) who will aid the patient in the event of a mental health crisis (suicidal ideations/suicide attempt).  With written consent from the patient, the family member/significant other has been provided the following suicide prevention education, prior to the and/or following the discharge of the patient.  The suicide prevention education provided includes the following:  Suicide risk factors  Suicide prevention and interventions  National Suicide Hotline telephone number  [facility] assessment telephone number  [facility] Emergency Assistance 911  [facility] and/or Residential Mobile Crisis Unit telephone number  Request made of family/significant other to:  Remove weapons (e.g., guns, rifles, knives), all items previously/currently identified as safety concern.    Remove drugs/medications (over-the-counter, prescriptions, illicit drugs), all items previously/currently identified as a safety concern.  The family member/significant other verbalizes understanding of the suicide prevention education information provided.  The family member/significant other agrees to remove the items of safety concern listed above.  Spoke w patient last night, "she's not herself by any means."  Patient was very irritable w father on phone. Per father, patient has no resources, had not worked in "years and years and years."  Took care of mother prior to her death, is caregiver to father who has beginning stages of Alzheimers.  Father is providing stable place for patient to stay in exchange for light housekeeping or transportation.  "Shes been threatening  for years to kill herself, multiple suicide attempts" in the past.  Father states patient has no access to guns in home.  Will work on securing medications that both she and he take.    Claudia Erickson 03/18/2017, 2:04 PM

## 2017-03-18 NOTE — Progress Notes (Addendum)
[facility] MD Progress Note  03/18/2017 3:20 PM Claudia Erickson  MRN:  [id number] Subjective: Pt stated, "I feels much better today with my depression but my anxiety is out of control".  Objective: Patient seen with Dr. Parke Poisson, chart reviewed, patient reports some improvement with her depression but that her anxiety is "out of control".  Patient stated that she is having difficult time controlling her anxiety, patient also reports sleep disturbance with episodes of sleep apnea. Patient stated that she used to use CPAP machine but haven't used in a while due to no insurance. Patient continues to report episodes of dizziness and feelings that room is spinning; said she covers her eyes and waits till it passes before continuing her activities. She reports improvement with her appetite. Pt today denies any SI/HI/VAH, patient does not appear to be responding to internal stimuli, alert and oriented x4. Patient requested for ativan dose to be increased due to not being fully effective for her size.   Principal Problem: <principal problem not specified> Diagnosis:   Patient Active Problem List   Diagnosis Date Noted  . Cocaine abuse [F14.10] 03/16/2017  . Tobacco abuse [Z72.0] 02/02/2017  . Panic attack [F41.0]   . Chronic venous insufficiency [I87.2] 10/28/2016  . Hyponatremia [E87.1] 10/28/2016  . Hyperglycemia [R73.9]   . Intentional drug overdose ([place]) [T50.902A]   . Suicidal ideation [R45.851]   . Urinary frequency [R35.0] 04/08/2015  . Anxiety and depression [F41.9, F32.9] 12/26/2014  . Insomnia [G47.00] 12/26/2014  . Major depressive disorder, recurrent severe without psychotic features ([place]) [F33.2] 08/27/2014  . Aspiration into airway [T17.908A] 12/06/2013  . Aspiration pneumonia ([place]) [J69.0] 12/06/2013  . Polysubstance abuse [F19.10] 12/06/2013  . Diabetes mellitus ([place]) [E11.9] 12/06/2013  . Syncope [R55] 12/06/2013  . Leucocytosis [D72.829] 12/06/2013  . Acute respiratory failure ([place])  [J96.00] 12/06/2013  . Elevated LFTs [R79.89] 12/06/2013   Total Time spent with patient: 20 minutes  Past Psychiatric History:   Past Medical History:  Past Medical History:  Diagnosis Date  . Anxiety and depression   . Chronic back pain   . Diabetes mellitus without complication ([place])   . GERD (gastroesophageal reflux disease)   . Hypertension   . Panic attack 1990s  . Peripheral neuropathy   . Positive TB test   . Substance abuse   . Substance induced mood disorder ([place])   . UTI (lower urinary tract infection)     Past Surgical History:  Procedure Laterality Date  . ABDOMINAL HYSTERECTOMY    . CHOLECYSTECTOMY    . ROTATOR CUFF REPAIR    . TONSILLECTOMY     Family History:  Family History  Problem Relation Age of Onset  . Diabetes Mellitus II Father   . Hyperlipidemia Father   . Diabetes Father   . Colon cancer Father 78       not sure of age he was diagnosed  . Arthritis Mother   . Hypertension Mother   . Heart disease Mother   . Drug abuse Son        incarcerated with drug issues  . CAD Neg Hx    Family Psychiatric  History: Unknown Social History:  History  Alcohol Use No     History  Drug Use  . Types: Cocaine    Comment: Heroine--only used briefly before.    Social History   Social History  . Marital status: Single    Spouse name: N/A  . Number of children: 2  . Years of education: 54  Occupational History  . 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 Smoking status: Current Every Day Smoker    Packs/day: 0.50    Years: 30.00    Types: Cigarettes  . Smokeless tobacco: Never Used  . Alcohol use No  . Drug use: Yes    Types: Cocaine     Comment: Heroine--only used briefly before.  Marland Kitchen Sexual activity: No   Other Topics Concern  . None   Social History Narrative   Born and raised in [place], [place].   Fun: Doesn't do a thing - never leaves the house.   Denies religious beliefs  affecting health care.    Recently moved in with father for caregiver purposes   Not in touch with older son.   Younger son incarcerated for drug related reasons.   Additional Social History:     Sleep: Fair  Appetite:  Good  Current Medications: Current Facility-Administered Medications  Medication Dose Route Frequency Provider Last Rate Last Dose  . acetaminophen (TYLENOL) tablet 650 mg  650 mg Oral Q4H PRN Patrecia Pour, NP   650 mg at 03/16/17 2122  . alum & mag hydroxide-simeth (MAALOX/MYLANTA) 200-200-20 MG/5ML suspension 30 mL  30 mL Oral Q4H PRN Patrecia Pour, NP      . citalopram (CELEXA) tablet 20 mg  20 mg Oral Daily Patrecia Pour, NP   20 mg at 03/18/17 0839  . gabapentin (NEURONTIN) capsule 400 mg  400 mg Oral TID Lurdes Haltiwanger A, MD      . hydrochlorothiazide (HYDRODIURIL) tablet 25 mg  25 mg Oral Daily Patrecia Pour, NP   25 mg at 03/18/17 0839  . insulin aspart protamine- aspart (NOVOLOG MIX 70/30) injection 30 Units  30 Units Subcutaneous Q supper Patrecia Pour, NP   30 Units at 03/17/17 1702  . insulin aspart protamine- aspart (NOVOLOG MIX 70/30) injection 40 Units  40 Units Subcutaneous Q breakfast Patrecia Pour, NP   40 Units at 03/18/17 0844  . LORazepam (ATIVAN) tablet 0.5 mg  0.5 mg Oral Q6H PRN Teresita Fanton, Myer Peer, MD   0.5 mg at 03/18/17 1258  . magnesium hydroxide (MILK OF MAGNESIA) suspension 30 mL  30 mL Oral Daily PRN Patrecia Pour, NP      . metFORMIN (GLUCOPHAGE) tablet 850 mg  850 mg Oral BID WC Patrecia Pour, NP   850 mg at 03/18/17 2355  . mirtazapine (REMERON) tablet 7.5 mg  7.5 mg Oral QHS Patrecia Pour, NP   7.5 mg at 03/17/17 2122  . nicotine (NICODERM CQ - dosed in mg/24 hours) patch 21 mg  21 mg Transdermal Daily Patrecia Pour, NP   21 mg at 03/18/17 0841  . ondansetron (ZOFRAN) tablet 4 mg  4 mg Oral Q8H PRN Patrecia Pour, NP      . pantoprazole (PROTONIX) EC tablet 40 mg  40 mg Oral Daily Patrecia Pour, NP   40 mg at  03/18/17 7322    Lab Results:  Results for orders placed or performed during the hospital encounter of 03/16/17 (from the past 48 hour(s))  Glucose, capillary     Status: Abnormal   Collection Time: 03/16/17  6:20 PM  Result Value Ref Range   Glucose-Capillary 258 (H) 65 - 99 mg/dL  Glucose, capillary     Status: Abnormal   Collection Time: 03/17/17  6:38 AM  Result Value Ref  Range   Glucose-Capillary 238 (H) 65 - 99 mg/dL  Glucose, capillary     Status: Abnormal   Collection Time: 03/17/17  4:58 PM  Result Value Ref Range   Glucose-Capillary 326 (H) 65 - 99 mg/dL  Glucose, capillary     Status: Abnormal   Collection Time: 03/18/17  5:57 AM  Result Value Ref Range   Glucose-Capillary 259 (H) 65 - 99 mg/dL   Comment 1 Notify RN    Comment 2 Document in Chart     Blood Alcohol level:  Lab Results  Component Value Date   ETH <5 03/15/2017   ETH <5 03/70/4888    Metabolic Disorder Labs: Lab Results  Component Value Date   HGBA1C 12.0 (H) 10/29/2016   MPG 298 10/29/2016   MPG 278 (H) 12/06/2013   No results found for: PROLACTIN No results found for: CHOL, TRIG, HDL, CHOLHDL, VLDL, LDLCALC  Physical Findings: AIMS: Facial and Oral Movements Muscles of Facial Expression: None, normal Lips and Perioral Area: None, normal Jaw: None, normal Tongue: None, normal,Extremity Movements Upper (arms, wrists, hands, fingers): None, normal Lower (legs, knees, ankles, toes): None, normal, Trunk Movements Neck, shoulders, hips: None, normal, Overall Severity Severity of abnormal movements (highest score from questions above): None, normal Incapacitation due to abnormal movements: None, normal Patient's awareness of abnormal movements (rate only patient's report): No Awareness, Dental Status Current problems with teeth and/or dentures?: No Does patient usually wear dentures?: No  CIWA:  CIWA-Ar Total: 0 COWS:     Musculoskeletal: Strength & Muscle Tone: within normal  limits Gait & Station: normal Patient leans: N/A  Psychiatric Specialty Exam: Physical Exam  ROS  Blood pressure 121/83, pulse (!) 103, temperature 99 F (37.2 C), resp. rate 20, height 5' 9"  (1.753 m), weight (!) 142 kg (313 lb), SpO2 97 %.Body mass index is 46.22 kg/m.  General Appearance: Casual and unremarkable  Eye Contact:  Good  Speech:  Clear and Coherent and Normal Rate  Volume:  Normal  Mood:  Anxious  Affect:  Congruent  Thought Process:  Coherent  Orientation:  Full (Time, Place, and Person)  Thought Content:  WDL and Logical  Suicidal Thoughts:  No  Homicidal Thoughts:  No  Memory:  Immediate;   Good Recent;   Good Remote;   Fair  Judgement:  Good  Insight:  Good  Psychomotor Activity:  Negative  Concentration:  Concentration: Good and Attention Span: Good  Recall:  Good  Fund of Knowledge:  Good  Language:  Good  Akathisia:  Negative  Handed:  Right  AIMS (if indicated):     Assets:  Communication Skills Desire for Improvement Financial Resources/Insurance Housing Resilience Social Support  ADL's:  Intact  Cognition:  WNL  Sleep:  Number of Hours: 6     Treatment Plan Summary: Daily contact with patient to assess and evaluate symptoms and progress in treatment and Medication management  -Increase Gabapentin (NEURONTIN) capsule from 300 mg to 400 mg three times daily for agitation, neuropathic pain -Continue Citalopram (Celexa) tablet 20 mgs daily for depression -Continue Mirtazapine (Remeron) 7.5 mg nightly for insomnia  -Continue Lorazepam (Ativan) tablet 0.5 mg Q6h PRN without change for anxiety -Continue all medical medications without change   Vicenta Aly, NP 03/18/2017, 3:20 PM   I have met patient along with NP and agree with note and plan. Patient presents partially improved, denies SI, continues to report significant anxiety. Thus far tolerating Celexa, Remeron, Neurontin well.  Will continue management as  above  FCobos, MD

## 2017-03-18 NOTE — Progress Notes (Signed)
Inpatient Diabetes Program Recommendations  AACE/ADA: New Consensus Statement on Inpatient Glycemic Control (2015)  Target Ranges:  Prepandial:   less than 140 mg/dL      Peak postprandial:   less than 180 mg/dL (1-2 hours)      Critically ill patients:  140 - 180 mg/dL    Results for Glori BickersLVIS, Easter B (MRN [id number]) as of 03/18/2017 06:46  Ref. Range 03/17/2017 06:38 03/17/2017 16:58 03/18/2017 05:57  Glucose-Capillary Latest Ref Range: 65 - 99 mg/dL 952238 (H) 841326 (H) 324259 (H)    Home DM Meds: Metformin 850 mg BID 70/30 Insulin: 40 units AM/ 30 units PM  Current Orders: Metformin 850 mg BID 70/30 Insulin: 40 units AM/ 30 units PM     MD- Patient did not receive morning dose of 70/30 insulin yesterday AM.  As a result, CBGs elevated yesterday afternoon/evening.   Please consider placing orders for Novolog Moderate Correction Scale/ SSI (0-15 units) TID AC + HS.   Please also consider increasing PM dose of 70/30 Insulin to 35 units with supper     --Will follow patient during hospitalization--  Ambrose FinlandJeannine Johnston Aadhya Bustamante RN, MSN, CDE Diabetes Coordinator Inpatient Glycemic Control Team Team Pager: [phone] (8a-5p)

## 2017-03-18 NOTE — Plan of Care (Signed)
Problem: Medication: Goal: Compliance with prescribed medication regimen will improve Outcome: Progressing Patient is taking her medications as prescribed.   

## 2017-03-19 LAB — GLUCOSE, CAPILLARY
GLUCOSE-CAPILLARY: 174 mg/dL — AB (ref 65–99)
GLUCOSE-CAPILLARY: 240 mg/dL — AB (ref 65–99)
GLUCOSE-CAPILLARY: 253 mg/dL — AB (ref 65–99)

## 2017-03-19 MED ORDER — CITALOPRAM HYDROBROMIDE 40 MG PO TABS
40.0000 mg | ORAL_TABLET | Freq: Every day | ORAL | Status: DC
  Administered 2017-03-20 – 2017-03-22 (×3): 40 mg via ORAL
  Filled 2017-03-19 (×2): qty 1
  Filled 2017-03-19 (×2): qty 7
  Filled 2017-03-19: qty 1
  Filled 2017-03-19: qty 2
  Filled 2017-03-19: qty 1

## 2017-03-19 MED ORDER — MIRTAZAPINE 15 MG PO TABS
15.0000 mg | ORAL_TABLET | Freq: Every day | ORAL | Status: DC
  Administered 2017-03-19 – 2017-03-21 (×3): 15 mg via ORAL
  Filled 2017-03-19: qty 1
  Filled 2017-03-19: qty 7
  Filled 2017-03-19: qty 1
  Filled 2017-03-19: qty 7
  Filled 2017-03-19 (×2): qty 1

## 2017-03-19 NOTE — Plan of Care (Signed)
Problem: Activity: Goal: Sleeping patterns will improve Outcome: Progressing Pt slept 6.75 hours last night according to flowsheet.    

## 2017-03-19 NOTE — Plan of Care (Signed)
Problem: Activity: Goal: Sleeping patterns will improve Outcome: Progressing Pt slept 6 hours last night according to flowsheet.    

## 2017-03-19 NOTE — Progress Notes (Signed)
Data. Patient denies SI/HI/AVH. Patient interacting well with staff and other patients. Affect continues to be flat, but her mood is less irritable this shift. Patient reported, "I'm going to the groups. Why would I come here if I wasn't going to take advantage of the groups to get better." Patient fell in the hallway as she was walking to the medication room, prior to evening meal. Patient's left flip flop got caught under her foot as she was walking, tripping her. She landed on her knees and then onto her hands. Patient was assisted to her feet with two staff. CBG taken-240, VS taken-WNL except pule 110. ROM WNL. Patient went down to meal. After meal ROM checked again and patient reported her left knee, "Has started to hurt." Ice applied. Will continue to monitor. NO visible swelling, discoloration noted. Action. Emotional support and encouragement offered. Education provided on medication, indications and side effect. Q 15 minute checks done for safety. Response. Safety on the unit maintained through 15 minute checks.  Medications taken as prescribed. Attended groups. Remained calm and appropriate through out shift.

## 2017-03-19 NOTE — Progress Notes (Signed)
Patient did attend the evening speaker AA meeting.  

## 2017-03-19 NOTE — Progress Notes (Signed)
D: Pt was in the dayroom upon initial approach.  Pt presents with depressed affect and mood.  Her goal is to "keep positive" and she reports she has been able to do this.  Pt denies SI/HI, denies hallucinations, reports left knee pain of 5/10.  Pt has been visible in milieu interacting with peers and staff appropriately.  Pt attended evening group.    A: Introduced self to pt.  Actively listened to pt and offered support and encouragement. Medication administered per order.  PRN medication administered for pain.  Cold packs provided for pain.  Fall prevention techniques reviewed with pt, pt verbalizes understanding.  Post fall vitals continue to be taken.  Q15 minute safety checks maintained.  R: Pt is safe on the unit.  Pt is compliant with medications.  Pt verbally contracts for safety.  She reports "I only fell because I tripped on my flip flop."  Pt has been ambulating slowly without assistance.  Will continue to monitor and assess.

## 2017-03-19 NOTE — Progress Notes (Signed)
[facility] MD Progress Note  03/19/2017 12:19 PM Claudia Erickson  MRN:  [id number]   Subjective: Pt stated, "I feel a lot of anxiety and medication is not good enough for my insomnia, my anxiety is out of control".  Objective: Patient seen with this MD, chart reviewed, patient reports some improvement with her insomnia, depression but that her anxiety is "out of control".  Patient stated that she is having difficult time controlling her anxiety. Patient stated that she used to use CPAP machine but haven't used in a while due to no insurance. Patient said she covers her eyes and waits till it passes before continuing her activities. She reports improvement with her appetite. Patient stated that I don't like this life and I am thinking going to my dad's home stay in garage and block all the vents and suffocate myself" she denies any HI/VAH, patient does not appear to be responding to internal stimuli. Patient requested for Celexa and remeron dose to be increased due to not being fully effective for her.   Principal Problem: <principal problem not specified> Diagnosis:   Patient Active Problem List   Diagnosis Date Noted  . Cocaine abuse [F14.10] 03/16/2017  . Tobacco abuse [Z72.0] 02/02/2017  . Panic attack [F41.0]   . Chronic venous insufficiency [I87.2] 10/28/2016  . Hyponatremia [E87.1] 10/28/2016  . Hyperglycemia [R73.9]   . Intentional drug overdose ([facility]) [T50.902A]   . Suicidal ideation [R45.851]   . Urinary frequency [R35.0] 04/08/2015  . Anxiety and depression [F41.9, F32.9] 12/26/2014  . Insomnia [G47.00] 12/26/2014  . Major depressive disorder, recurrent severe without psychotic features ([facility]) [F33.2] 08/27/2014  . Aspiration into airway [T17.908A] 12/06/2013  . Aspiration pneumonia ([facility]) [J69.0] 12/06/2013  . Polysubstance abuse [F19.10] 12/06/2013  . Diabetes mellitus ([facility]) [E11.9] 12/06/2013  . Syncope [R55] 12/06/2013  . Leucocytosis [D72.829] 12/06/2013  . Acute respiratory  failure ([facility]) [J96.00] 12/06/2013  . Elevated LFTs [R79.89] 12/06/2013   Total Time spent with patient: 20 minutes  Past Psychiatric History: Most recently prescribed  medications have been Abilify,Celexa, Neurontin , Remeron. States she stopped these medications a few weeks ago after she ran out   Past Medical History:  Past Medical History:  Diagnosis Date  . Anxiety and depression   . Chronic back pain   . Diabetes mellitus without complication ([facility])   . GERD (gastroesophageal reflux disease)   . Hypertension   . Panic attack 1990s  . Peripheral neuropathy   . Positive TB test   . Substance abuse   . Substance induced mood disorder ([facility])   . UTI (lower urinary tract infection)     Past Surgical History:  Procedure Laterality Date  . ABDOMINAL HYSTERECTOMY    . CHOLECYSTECTOMY    . ROTATOR CUFF REPAIR    . TONSILLECTOMY     Family History:  Family History  Problem Relation Age of Onset  . Diabetes Mellitus II Father   . Hyperlipidemia Father   . Diabetes Father   . Colon cancer Father 26       not sure of age he was diagnosed  . Arthritis Mother   . Hypertension Mother   . Heart disease Mother   . Drug abuse Son        incarcerated with drug issues  . CAD Neg Hx    Family Psychiatric  History: Father is alive, has Dementia, Mother passed away 26-Jun-2016. Has one sister .  Social History:  History  Alcohol Use No  History  Drug Use  . Types: Cocaine    Comment: Heroine--only used briefly before.    Social History   Social History  . Marital status: Single    Spouse name: N/A  . Number of children: 2  . Years of education: 14   Occupational History  . 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 Smoking status: Current Every Day Smoker    Packs/day: 0.50    Years: 30.00    Types: Cigarettes  . Smokeless tobacco: Never Used  . Alcohol use No  . Drug use: Yes    Types: Cocaine     Comment:  Heroine--only used briefly before.  Marland Kitchen. Sexual activity: No   Other Topics Concern  . None   Social History Narrative   [place]   Fun: Doesn't do a thing - never leaves the house.   Denies religious beliefs affecting health care.    Recently moved in with father for caregiver purposes   Not in touch with older son.   Younger son incarcerated for drug related reasons.   Additional Social History:     Sleep: Fair  Appetite:  Good  Current Medications: Current Facility-Administered Medications  Medication Dose Route Frequency Provider Last Rate Last Dose  . acetaminophen (TYLENOL) tablet 650 mg  650 mg Oral Q4H PRN Charm RingsLord, Jamison Y, NP   650 mg at 03/16/17 2122  . alum & mag hydroxide-simeth (MAALOX/MYLANTA) 200-200-20 MG/5ML suspension 30 mL  30 mL Oral Q4H PRN Charm RingsLord, Jamison Y, NP      . citalopram (CELEXA) tablet 20 mg  20 mg Oral Daily Charm RingsLord, Jamison Y, NP   20 mg at 03/19/17 1017  . gabapentin (NEURONTIN) capsule 400 mg  400 mg Oral TID Cobos, Rockey SituFernando A, MD   400 mg at 03/19/17 1019  . hydrochlorothiazide (HYDRODIURIL) tablet 25 mg  25 mg Oral Daily Charm RingsLord, Jamison Y, NP   25 mg at 03/19/17 1015  . insulin aspart protamine- aspart (NOVOLOG MIX 70/30) injection 30 Units  30 Units Subcutaneous Q supper Charm RingsLord, Jamison Y, NP   30 Units at 03/18/17 1706  . insulin aspart protamine- aspart (NOVOLOG MIX 70/30) injection 40 Units  40 Units Subcutaneous Q breakfast Charm RingsLord, Jamison Y, NP   40 Units at 03/19/17 1020  . LORazepam (ATIVAN) tablet 0.5 mg  0.5 mg Oral Q6H PRN Cobos, Rockey SituFernando A, MD   0.5 mg at 03/19/17 1018  . magnesium hydroxide (MILK OF MAGNESIA) suspension 30 mL  30 mL Oral Daily PRN Charm RingsLord, Jamison Y, NP      . metFORMIN (GLUCOPHAGE) tablet 850 mg  850 mg Oral BID WC Charm RingsLord, Jamison Y, NP   850 mg at 03/19/17 1016  . mirtazapine (REMERON) tablet 7.5 mg  7.5 mg Oral QHS Charm RingsLord, Jamison Y, NP   7.5 mg at 03/18/17 2116  . nicotine (NICODERM CQ - dosed in mg/24 hours)  patch 21 mg  21 mg Transdermal Daily Charm RingsLord, Jamison Y, NP   21 mg at 03/19/17 1016  . ondansetron (ZOFRAN) tablet 4 mg  4 mg Oral Q8H PRN Charm RingsLord, Jamison Y, NP      . pantoprazole (PROTONIX) EC tablet 40 mg  40 mg Oral Daily Charm RingsLord, Jamison Y, NP   40 mg at 03/19/17 1014    Lab Results:  Results for orders placed or performed during the hospital encounter of 03/16/17 (from the past 48  hour(s))  Glucose, capillary     Status: Abnormal   Collection Time: 03/17/17  4:58 PM  Result Value Ref Range   Glucose-Capillary 326 (H) 65 - 99 mg/dL  Glucose, capillary     Status: Abnormal   Collection Time: 03/18/17  5:57 AM  Result Value Ref Range   Glucose-Capillary 259 (H) 65 - 99 mg/dL   Comment 1 Notify RN    Comment 2 Document in Chart   Glucose, capillary     Status: Abnormal   Collection Time: 03/18/17  4:53 PM  Result Value Ref Range   Glucose-Capillary 341 (H) 65 - 99 mg/dL  Glucose, capillary     Status: Abnormal   Collection Time: 03/19/17  5:42 AM  Result Value Ref Range   Glucose-Capillary 174 (H) 65 - 99 mg/dL   Comment 1 Notify RN     Blood Alcohol level:  Lab Results  Component Value Date   ETH <5 03/15/2017   ETH <5 10/26/2016    Metabolic Disorder Labs: Lab Results  Component Value Date   HGBA1C 12.0 (H) 10/29/2016   MPG 298 10/29/2016   MPG 278 (H) 12/06/2013   No results found for: PROLACTIN No results found for: CHOL, TRIG, HDL, CHOLHDL, VLDL, LDLCALC  Physical Findings: AIMS: Facial and Oral Movements Muscles of Facial Expression: None, normal Lips and Perioral Area: None, normal Jaw: None, normal Tongue: None, normal,Extremity Movements Upper (arms, wrists, hands, fingers): None, normal Lower (legs, knees, ankles, toes): None, normal, Trunk Movements Neck, shoulders, hips: None, normal, Overall Severity Severity of abnormal movements (highest score from questions above): None, normal Incapacitation due to abnormal movements: None, normal Patient's  awareness of abnormal movements (rate only patient's report): No Awareness, Dental Status Current problems with teeth and/or dentures?: No Does patient usually wear dentures?: No  CIWA:  CIWA-Ar Total: 0 COWS:     Musculoskeletal: Strength & Muscle Tone: within normal limits Gait & Station: normal Patient leans: N/A  Psychiatric Specialty Exam: Physical Exam  ROS  Blood pressure (!) 107/98, pulse 98, temperature 98.3 F (36.8 C), temperature source Oral, resp. rate 18, height 5\' 9"  (1.753 m), weight (!) 142 kg (313 lb), SpO2 97 %.Body mass index is 46.22 kg/m.  General Appearance: Casual and unremarkable, morbidly obese  Eye Contact:  Good  Speech:  Clear and Coherent and Normal Rate  Volume:  Normal  Mood:  Anxious  Affect:  Congruent  Thought Process:  Coherent  Orientation:  Full (Time, Place, and Person)  Thought Content:  WDL and Logical  Suicidal Thoughts:  Yes.  with intent/plan  Homicidal Thoughts:  No  Memory:  Immediate;   Good Recent;   Good Remote;   Fair  Judgement:  Good  Insight:  Good  Psychomotor Activity:  Negative  Concentration:  Concentration: Good and Attention Span: Good  Recall:  Good  Fund of Knowledge:  Good  Language:  Good  Akathisia:  Negative  Handed:  Right  AIMS (if indicated):     Assets:  Communication Skills Desire for Improvement Financial Resources/Insurance Housing Resilience Social Support  ADL's:  Intact  Cognition:  WNL  Sleep:  Number of Hours: 6.75     Treatment Plan Summary: Daily contact with patient to assess and evaluate symptoms and progress in treatment and Medication management   Continue Gabapentin (NEURONTIN) capsule from 300 mg to 400 mg three times daily for agitation, neuropathic pain Increase Citalopram (Celexa) tablet 40 mgs daily for depression Increase Mirtazapine (Remeron) 15 mg  nightly for insomnia  Continue Lorazepam (Ativan) tablet 0.5 mg Q6h PRN without change for anxiety Continue all medical  medications without change Disposition plans as per primary team and CSW on the unit.   Leata Mouse, MD 03/19/2017, 12:19 PM

## 2017-03-19 NOTE — Progress Notes (Signed)
D: Pt was at nurse's station upon initial approach.  Pt presents with anxious affect and mood.  She reports her goal is to "enjoy time with my roommate who is leaving tomorrow."  Pt denies SI/HI, denies hallucinations, denies pain.  Pt has been visible in milieu interacting with peers and staff appropriately.  Pt attended evening group.    A: Actively listened to pt and offered support and encouragement. Medication administered per order.  PRN medication administered for anxiety.  Q15 minute safety checks maintained.  R: Pt is safe on the unit.  Pt is compliant with medication.  Pt verbally contracts for safety.  Will continue to monitor and assess.

## 2017-03-19 NOTE — [facility] Group Notes (Signed)
[facility] Group Notes:  (Nursing/MHT/Case Management/Adjunct)  Date:  03/19/2017  Time:  4:17 PM  Type of Therapy:  Nurse Education  Participation Level:  Active  Participation Quality:  Appropriate  Affect:  Appropriate  Cognitive:  Appropriate  Insight:  Appropriate  Engagement in Group:  Engaged  Modes of Intervention:  Activity  Summary of Progress/Problems:   Patients wrote positive attributes on pertaining to their peers, then accepted positive attributes written about them.   [name] Porter Nakama 03/19/2017, 4:17 PM

## 2017-03-19 NOTE — [facility] Group Notes (Signed)
  [facility] LCSW Group Therapy Note  03/19/2017  and  10:15 AM  Type of Therapy and Topic:  Group Therapy: Avoiding Self-Sabotaging and Enabling Behaviors  Participation Level:  Active  Participation Quality:  Attentive and Sharing  Affect:  Anxious and Depressed  Cognitive:  Appropriate  Insight:  Developing/Improving  Engagement in Therapy:  Engaged   Therapeutic models used: Cognitive Behavioral Therapy,  Person-Centered Therapy and Motivational Interviewing  Modes of Intervention:  Discussion, Exploration, Orientation, Rapport Building, Socialization and Support   Summary of Progress/Problems:  The main focus of today's process group was for the patient to identify ways in which they have in the past sabotaged their own recovery. Motivational Interviewing was utilized to identify motivation they may have for wanting to change. Patient identified her anger as  Cover for her vuneralbility which she does not like to show.   Carney Bernatherine C Harrill, LCSW

## 2017-03-20 LAB — GLUCOSE, CAPILLARY
Glucose-Capillary: 151 mg/dL — ABNORMAL HIGH (ref 65–99)
Glucose-Capillary: 236 mg/dL — ABNORMAL HIGH (ref 65–99)

## 2017-03-20 NOTE — Progress Notes (Signed)
Data. Patient denies SI/HI/AVH. Patient interacting well with staff and other patients. Affect is still irritable, but less so than yesterday. She did take her AM medications late, "What do you expect when I can't tell what time it is." She reports that she is starting to have pain in her upper abdomen, under her left breast, left knee and her left shoulder from the fall yesterday. PRM meds given. No swelling, or bruising noted in any of these areas, and ROM is WNL in knee, and shoulder.  Action. Emotional support and encouragement offered. Education provided on medication, indications and side effect. Q 15 minute checks done for safety. Response. Safety on the unit maintained through 15 minute checks.  Medications taken as prescribed. Attended groups. Remained calm and appropriate through out shift.

## 2017-03-20 NOTE — [facility] Group Notes (Signed)
[facility] LCSW Group Therapy  03/20/2017 10 AM  Type of Therapy:  Group Therapy  Participation Level:  Did Not Attend as patient was attending group on [address] hall.    Carney Bernatherine C Harrill, LCSW

## 2017-03-20 NOTE — Progress Notes (Signed)
[facility] MD Progress Note  03/20/2017 2:04 PM Claudia Erickson  MRN:  [id number]   Subjective: Im ok. I fell yesterday over my flip flops. Hopefully I leave soon, I was here not to long ago and Im ready to go now. I came to talk to a Child psychotherapist and get on top of some additional resources.   Objective: Patient seen with this NP, patient is admitted with worsening depression, anxiety, and suicidal ideations with a plan for CO2 poisoning.   Today on evaluation she reports significant improvement despite yesterday telling provider she was to anxious. SHe also denies any anxiety and or depression symptoms, instead endorses some readiness symptoms. She reports being positive, and being more organized to reduce her anxiety. She reports improvement with her appetite. she denies any HI/VAH, patient does not appear to be responding to internal stimuli. Patient requested for Celexa and remeron dose to be increased due to not being fully effective for her.   Principal Problem: <principal problem not specified> Diagnosis:   Patient Active Problem List   Diagnosis Date Noted  . Cocaine abuse [F14.10] 03/16/2017  . Tobacco abuse [Z72.0] 02/02/2017  . Panic attack [F41.0]   . Chronic venous insufficiency [I87.2] 10/28/2016  . Hyponatremia [E87.1] 10/28/2016  . Hyperglycemia [R73.9]   . Intentional drug overdose (HCC) [T50.902A]   . Suicidal ideation [R45.851]   . Urinary frequency [R35.0] 04/08/2015  . Anxiety and depression [F41.9, F32.9] 12/26/2014  . Insomnia [G47.00] 12/26/2014  . Major depressive disorder, recurrent severe without psychotic features (HCC) [F33.2] 08/27/2014  . Aspiration into airway [T17.908A] 12/06/2013  . Aspiration pneumonia (HCC) [J69.0] 12/06/2013  . Polysubstance abuse [F19.10] 12/06/2013  . Diabetes mellitus (HCC) [E11.9] 12/06/2013  . Syncope [R55] 12/06/2013  . Leucocytosis [D72.829] 12/06/2013  . Acute respiratory failure (HCC) [J96.00] 12/06/2013  . Elevated LFTs [R79.89]  12/06/2013   Total Time spent with patient: 20 minutes  Past Psychiatric History: Most recently prescribed  medications have been Abilify,Celexa, Neurontin , Remeron. States she stopped these medications a few weeks ago after she ran out   Past Medical History:  Past Medical History:  Diagnosis Date  . Anxiety and depression   . Chronic back pain   . Diabetes mellitus without complication (HCC)   . GERD (gastroesophageal reflux disease)   . Hypertension   . Panic attack 1990s  . Peripheral neuropathy   . Positive TB test   . Substance abuse   . Substance induced mood disorder (HCC)   . UTI (lower urinary tract infection)     Past Surgical History:  Procedure Laterality Date  . ABDOMINAL HYSTERECTOMY    . CHOLECYSTECTOMY    . ROTATOR CUFF REPAIR    . TONSILLECTOMY     Family History:  Family History  Problem Relation Age of Onset  . Diabetes Mellitus II Father   . Hyperlipidemia Father   . Diabetes Father   . Colon cancer Father 73       not sure of age he was diagnosed  . Arthritis Mother   . Hypertension Mother   . Heart disease Mother   . Drug abuse Son        incarcerated with drug issues  . CAD Neg Hx    Family Psychiatric  History: Father is alive, has Dementia, Mother passed away June 22, 2016. Has one sister .  Social History:  History  Alcohol Use No     History  Drug Use  . Types: Cocaine    Comment: Heroine--only  used briefly before.    Social History   Social History  . Marital status: Single    Spouse name: N/A  . Number of children: 2  . Years of education: 14   Occupational History  . 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 Smoking status: Current Every Day Smoker    Packs/day: 0.50    Years: 30.00    Types: Cigarettes  . Smokeless tobacco: Never Used  . Alcohol use No  . Drug use: Yes    Types: Cocaine     Comment: Heroine--only used briefly before.  Marland Kitchen. Sexual activity: No    Other Topics Concern  . None   Social History Narrative   [place]   Fun: Doesn't do a thing - never leaves the house.   Denies religious beliefs affecting health care.    Recently moved in with father for caregiver purposes   Not in touch with older son.   Younger son incarcerated for drug related reasons.   Additional Social History:     Sleep: Fair  Appetite:  Good  Current Medications: Current Facility-Administered Medications  Medication Dose Route Frequency Provider Last Rate Last Dose  . acetaminophen (TYLENOL) tablet 650 mg  650 mg Oral Q4H PRN Charm RingsLord, Jamison Y, NP   650 mg at 03/20/17 1016  . alum & mag hydroxide-simeth (MAALOX/MYLANTA) 200-200-20 MG/5ML suspension 30 mL  30 mL Oral Q4H PRN Charm RingsLord, Jamison Y, NP      . citalopram (CELEXA) tablet 40 mg  40 mg Oral Daily Leata MouseJonnalagadda, Janardhana, MD   40 mg at 03/20/17 1014  . gabapentin (NEURONTIN) capsule 400 mg  400 mg Oral TID Cobos, Rockey SituFernando A, MD   400 mg at 03/20/17 1215  . hydrochlorothiazide (HYDRODIURIL) tablet 25 mg  25 mg Oral Daily Charm RingsLord, Jamison Y, NP   25 mg at 03/20/17 1014  . insulin aspart protamine- aspart (NOVOLOG MIX 70/30) injection 30 Units  30 Units Subcutaneous Q supper Charm RingsLord, Jamison Y, NP   30 Units at 03/19/17 1735  . insulin aspart protamine- aspart (NOVOLOG MIX 70/30) injection 40 Units  40 Units Subcutaneous Q breakfast Charm RingsLord, Jamison Y, NP   40 Units at 03/20/17 1020  . LORazepam (ATIVAN) tablet 0.5 mg  0.5 mg Oral Q6H PRN Cobos, Rockey SituFernando A, MD   0.5 mg at 03/20/17 1304  . magnesium hydroxide (MILK OF MAGNESIA) suspension 30 mL  30 mL Oral Daily PRN Charm RingsLord, Jamison Y, NP      . metFORMIN (GLUCOPHAGE) tablet 850 mg  850 mg Oral BID WC Charm RingsLord, Jamison Y, NP   850 mg at 03/20/17 1014  . mirtazapine (REMERON) tablet 15 mg  15 mg Oral QHS Leata MouseJonnalagadda, Janardhana, MD   15 mg at 03/19/17 2103  . nicotine (NICODERM CQ - dosed in mg/24 hours) patch 21 mg  21 mg Transdermal Daily Charm RingsLord,  Jamison Y, NP   21 mg at 03/20/17 1013  . ondansetron (ZOFRAN) tablet 4 mg  4 mg Oral Q8H PRN Charm RingsLord, Jamison Y, NP      . pantoprazole (PROTONIX) EC tablet 40 mg  40 mg Oral Daily Charm RingsLord, Jamison Y, NP   40 mg at 03/20/17 1015    Lab Results:  Results for orders placed or performed during the hospital encounter of 03/16/17 (from the past 48 hour(s))  Glucose, capillary     Status: Abnormal   Collection Time: 03/18/17  4:53 PM  Result Value Ref Range   Glucose-Capillary 341 (H) 65 - 99 mg/dL  Glucose, capillary     Status: Abnormal   Collection Time: 03/19/17  5:42 AM  Result Value Ref Range   Glucose-Capillary 174 (H) 65 - 99 mg/dL   Comment 1 Notify RN   Glucose, capillary     Status: Abnormal   Collection Time: 03/19/17  5:35 PM  Result Value Ref Range   Glucose-Capillary 240 (H) 65 - 99 mg/dL  Glucose, capillary     Status: Abnormal   Collection Time: 03/19/17  7:23 PM  Result Value Ref Range   Glucose-Capillary 253 (H) 65 - 99 mg/dL   Comment 1 Notify RN   Glucose, capillary     Status: Abnormal   Collection Time: 03/20/17  6:02 AM  Result Value Ref Range   Glucose-Capillary 151 (H) 65 - 99 mg/dL   Comment 1 Notify RN    Comment 2 Document in Chart     Blood Alcohol level:  Lab Results  Component Value Date   ETH <5 03/15/2017   ETH <5 10/26/2016    Metabolic Disorder Labs: Lab Results  Component Value Date   HGBA1C 12.0 (H) 10/29/2016   MPG 298 10/29/2016   MPG 278 (H) 12/06/2013   No results found for: PROLACTIN No results found for: CHOL, TRIG, HDL, CHOLHDL, VLDL, LDLCALC  Physical Findings: AIMS: Facial and Oral Movements Muscles of Facial Expression: None, normal Lips and Perioral Area: None, normal Jaw: None, normal Tongue: None, normal,Extremity Movements Upper (arms, wrists, hands, fingers): None, normal Lower (legs, knees, ankles, toes): None, normal, Trunk Movements Neck, shoulders, hips: None, normal, Overall Severity Severity of abnormal  movements (highest score from questions above): None, normal Incapacitation due to abnormal movements: None, normal Patient's awareness of abnormal movements (rate only patient's report): No Awareness, Dental Status Current problems with teeth and/or dentures?: No Does patient usually wear dentures?: No  CIWA:  CIWA-Ar Total: 0 COWS:     Musculoskeletal: Strength & Muscle Tone: within normal limits Gait & Station: normal Patient leans: N/A  Psychiatric Specialty Exam: Physical Exam   ROS   Blood pressure 126/89, pulse 88, temperature 98.3 F (36.8 C), temperature source Oral, resp. rate 18, height 5\' 9"  (1.753 m), weight (!) 142 kg (313 lb), SpO2 96 %.Body mass index is 46.22 kg/m.  General Appearance: Casual and unremarkable, morbidly obese  Eye Contact:  Good  Speech:  Clear and Coherent and Normal Rate  Volume:  Normal  Mood:  Euthymic  Affect:  Congruent and Constricted  Thought Process:  Coherent and Descriptions of Associations: Intact  Orientation:  Full (Time, Place, and Person)  Thought Content:  WDL and Logical  Suicidal Thoughts:  No  Homicidal Thoughts:  No  Memory:  Immediate;   Good Recent;   Good Remote;   Fair  Judgement:  Good  Insight:  Good  Psychomotor Activity:  Negative  Concentration:  Concentration: Good and Attention Span: Good  Recall:  Good  Fund of Knowledge:  Good  Language:  Good  Akathisia:  Negative  Handed:  Right  AIMS (if indicated):     Assets:  Communication Skills Desire for Improvement Financial Resources/Insurance Housing Resilience Social Support  ADL's:  Intact  Cognition:  WNL  Sleep:  Number of Hours: 4.25     Treatment Plan Summary: Daily contact with patient to assess and evaluate symptoms and progress in treatment and Medication management   Continue Gabapentin (NEURONTIN) capsule  from 300 mg to 400 mg three times daily for agitation, neuropathic pain Increase Citalopram (Celexa) tablet 40 mgs daily for  depression Increase Mirtazapine (Remeron) 15 mg nightly for insomnia  Continue Lorazepam (Ativan) tablet 0.5 mg Q6h PRN without change for anxiety Continue all medical medications without change Disposition plans as per primary team and CSW on the unit.   Truman Hayward, FNP 03/20/2017, 2:04 PM

## 2017-03-20 NOTE — Plan of Care (Signed)
Problem: Medication: Goal: Compliance with prescribed medication regimen will improve Outcome: Progressing Patient did take all iof her medications, though she came late for her AM dose.

## 2017-03-20 NOTE — Progress Notes (Signed)
D    Pt is somewhat irritable and anxious   She said she didn't sleep last night because she fell and staff kept waking her up last night to get her vital signs    She is hoping she will be able to sleep tonight   She endorses some depression and anxiety    She attends and participates in groups   She has appropriate behaviors A    Verbal support and encouragement given    Medications administered and effectiveness monitored    Q 15 min checks  R    Pt is safe at present time and is receptive to verbal support

## 2017-03-20 NOTE — [facility] Group Notes (Signed)
[facility] Group Notes:  (Nursing/MHT/Case Management/Adjunct)  Date:  03/20/2017  Time:  4:31 PM  Type of Therapy:  Nurse Education  Participation Level:  Active  Participation Quality:  Appropriate  Affect:  Appropriate  Cognitive:  Appropriate  Insight:  Appropriate  Engagement in Group:  Engaged  Modes of Intervention:  Activity, Discussion and Education  Summary of Progress/Problems:  Group was focused on changing negative self talk to positive ans choosing an active, vs passive recovery.  [name] Beola Vasallo 03/20/2017, 4:31 PM

## 2017-03-21 LAB — GLUCOSE, CAPILLARY
GLUCOSE-CAPILLARY: 184 mg/dL — AB (ref 65–99)
GLUCOSE-CAPILLARY: 156 mg/dL — AB (ref 65–99)
GLUCOSE-CAPILLARY: 213 mg/dL — AB (ref 65–99)
Glucose-Capillary: 193 mg/dL — ABNORMAL HIGH (ref 65–99)

## 2017-03-21 MED ORDER — INSULIN ASPART 100 UNIT/ML ~~LOC~~ SOLN
0.0000 [IU] | Freq: Every day | SUBCUTANEOUS | Status: DC
  Administered 2017-03-21: 2 [IU] via SUBCUTANEOUS

## 2017-03-21 MED ORDER — INSULIN ASPART 100 UNIT/ML ~~LOC~~ SOLN
0.0000 [IU] | Freq: Three times a day (TID) | SUBCUTANEOUS | Status: DC
  Administered 2017-03-21 – 2017-03-22 (×3): 3 [IU] via SUBCUTANEOUS

## 2017-03-21 MED ORDER — INSULIN ASPART PROT & ASPART (70-30 MIX) 100 UNIT/ML ~~LOC~~ SUSP
35.0000 [IU] | Freq: Every day | SUBCUTANEOUS | Status: DC
  Administered 2017-03-21: 35 [IU] via SUBCUTANEOUS

## 2017-03-21 NOTE — Progress Notes (Signed)
D: Pt presents with flat affect and depressed mood. Pt rates depression 6/10. Anxiety 10/10. Hopelessness 5/10. Pt reports fair sleep and appetite. Pt denies SI/HI. Pt stated that the Ativan is not effective for relieving anxiety level. Cobos, MD., made aware in treatment team. Pt appears withdrawn and isolates in her room. Pt has minimal interaction on the unit. Pt continues to c/o dizziness. Pt b/p rechecked and monitored. Fluid encouraged. MD made aware.  A: Medications reviewed with pt. Medications administered as ordered per MD. Diabetes recommendations updated by Alcario Droughtanika, NP. Verbal support provided. Pt encouraged to attend groups. 15 minute checks performed for safety.  R: Pt receptive to tx. Pt requesting to discharge home today.

## 2017-03-21 NOTE — Progress Notes (Signed)
[facility] MD Progress Note  03/21/2017 3:03 PM MIEKA LEATON  MRN:  [id number]   Subjective: Katelin reports, "I'm feeling a lot better. I got to feeling suicidal because I ran out of my mental health medications because I could not afford them. But, there is a plan in place for me to get my medicines after discharge. I will also be following-up with the mental health association after discharge. I will need to be discharged as I'm back to my normal self".  Objective: Deltha is seen chart reviewed. Today, during this evaluation she reports significant improvement. She denies any anxiety and or depression symptoms. She says she is fully aware now that she has to be on her medications as prescribed to maintain mood stability. She reports being positive & more organized to deal with her mental health issues. She will joining the mental health association after discharge. She currently denies any SIHI, AVH, delusional thoughts or paranoia.  Principal Problem: Major depressive disorder, recurrent episode  Diagnosis:   Patient Active Problem List   Diagnosis Date Noted  . Cocaine abuse [F14.10] 03/16/2017  . Tobacco abuse [Z72.0] 02/02/2017  . Panic attack [F41.0]   . Chronic venous insufficiency [I87.2] 10/28/2016  . Hyponatremia [E87.1] 10/28/2016  . Hyperglycemia [R73.9]   . Intentional drug overdose (HCC) [T50.902A]   . Suicidal ideation [R45.851]   . Urinary frequency [R35.0] 04/08/2015  . Anxiety and depression [F41.9, F32.9] 12/26/2014  . Insomnia [G47.00] 12/26/2014  . Major depressive disorder, recurrent severe without psychotic features (HCC) [F33.2] 08/27/2014  . Aspiration into airway [T17.908A] 12/06/2013  . Aspiration pneumonia (HCC) [J69.0] 12/06/2013  . Polysubstance abuse [F19.10] 12/06/2013  . Diabetes mellitus (HCC) [E11.9] 12/06/2013  . Syncope [R55] 12/06/2013  . Leucocytosis [D72.829] 12/06/2013  . Acute respiratory failure (HCC) [J96.00] 12/06/2013  . Elevated LFTs  [R79.89] 12/06/2013   Total Time spent with patient: 15 minutes  Past Psychiatric History: Most recently prescribed  medications have been Abilify,Celexa, Neurontin , Remeron. States she stopped these medications a few weeks ago after she ran out   Past Medical History:  Past Medical History:  Diagnosis Date  . Anxiety and depression   . Chronic back pain   . Diabetes mellitus without complication (HCC)   . GERD (gastroesophageal reflux disease)   . Hypertension   . Panic attack 1990s  . Peripheral neuropathy   . Positive TB test   . Substance abuse   . Substance induced mood disorder (HCC)   . UTI (lower urinary tract infection)     Past Surgical History:  Procedure Laterality Date  . ABDOMINAL HYSTERECTOMY    . CHOLECYSTECTOMY    . ROTATOR CUFF REPAIR    . TONSILLECTOMY     Family History:  Family History  Problem Relation Age of Onset  . Diabetes Mellitus II Father   . Hyperlipidemia Father   . Diabetes Father   . Colon cancer Father 90       not sure of age he was diagnosed  . Arthritis Mother   . Hypertension Mother   . Heart disease Mother   . Drug abuse Son        incarcerated with drug issues  . CAD Neg Hx    Family Psychiatric  History: Father is alive, has Dementia, Mother passed away 06-29-2016. Has one sister .   Social History:  History  Alcohol Use No     History  Drug Use  . Types: Cocaine    Comment: Heroine--only used  briefly before.    Social History   Social History  . Marital status: Single    Spouse name: N/A  . Number of children: 2  . Years of education: 14   Occupational History  . 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 Smoking status: Current Every Day Smoker    Packs/day: 0.50    Years: 30.00    Types: Cigarettes  . Smokeless tobacco: Never Used  . Alcohol use No  . Drug use: Yes    Types: Cocaine     Comment: Heroine--only used briefly before.  Marland Kitchen Sexual activity:  No   Other Topics Concern  . None   Social History Narrative   Born and raised in [place], [place].   Fun: Doesn't do a thing - never leaves the house.   Denies religious beliefs affecting health care.    Recently moved in with father for caregiver purposes   Not in touch with older son.   Younger son incarcerated for drug related reasons.   Additional Social History:     Sleep: Fair  Appetite:  Good  Current Medications: Current Facility-Administered Medications  Medication Dose Route Frequency Provider Last Rate Last Dose  . acetaminophen (TYLENOL) tablet 650 mg  650 mg Oral Q4H PRN Charm Rings, NP   650 mg at 03/20/17 1704  . alum & mag hydroxide-simeth (MAALOX/MYLANTA) 200-200-20 MG/5ML suspension 30 mL  30 mL Oral Q4H PRN Charm Rings, NP      . citalopram (CELEXA) tablet 40 mg  40 mg Oral Daily Leata Mouse, MD   40 mg at 03/21/17 4098  . gabapentin (NEURONTIN) capsule 400 mg  400 mg Oral TID Cobos, Rockey Situ, MD   400 mg at 03/21/17 1223  . hydrochlorothiazide (HYDRODIURIL) tablet 25 mg  25 mg Oral Daily Charm Rings, NP   25 mg at 03/21/17 1191  . insulin aspart (novoLOG) injection 0-15 Units  0-15 Units Subcutaneous TID WC Oneta Rack, NP   3 Units at 03/21/17 1226  . insulin aspart (novoLOG) injection 0-5 Units  0-5 Units Subcutaneous QHS Lewis, Tanika N, NP      . insulin aspart protamine- aspart (NOVOLOG MIX 70/30) injection 35 Units  35 Units Subcutaneous Q supper Oneta Rack, NP      . insulin aspart protamine- aspart (NOVOLOG MIX 70/30) injection 40 Units  40 Units Subcutaneous Q breakfast Charm Rings, NP   40 Units at 03/21/17 0837  . LORazepam (ATIVAN) tablet 0.5 mg  0.5 mg Oral Q6H PRN Cobos, Rockey Situ, MD   0.5 mg at 03/21/17 1338  . magnesium hydroxide (MILK OF MAGNESIA) suspension 30 mL  30 mL Oral Daily PRN Charm Rings, NP      . metFORMIN (GLUCOPHAGE) tablet 850 mg  850 mg Oral BID WC Charm Rings, NP   850 mg at  03/21/17 4782  . mirtazapine (REMERON) tablet 15 mg  15 mg Oral QHS Leata Mouse, MD   15 mg at 03/20/17 2108  . nicotine (NICODERM CQ - dosed in mg/24 hours) patch 21 mg  21 mg Transdermal Daily Charm Rings, NP   21 mg at 03/21/17 0839  . ondansetron (ZOFRAN) tablet 4 mg  4 mg Oral Q8H PRN Charm Rings, NP      . pantoprazole (PROTONIX) EC tablet 40 mg  40 mg Oral Daily Charm Rings, NP  40 mg at 03/21/17 8295   Lab Results:  Results for orders placed or performed during the hospital encounter of 03/16/17 (from the past 48 hour(s))  Glucose, capillary     Status: Abnormal   Collection Time: 03/19/17  5:35 PM  Result Value Ref Range   Glucose-Capillary 240 (H) 65 - 99 mg/dL  Glucose, capillary     Status: Abnormal   Collection Time: 03/19/17  7:23 PM  Result Value Ref Range   Glucose-Capillary 253 (H) 65 - 99 mg/dL   Comment 1 Notify RN   Glucose, capillary     Status: Abnormal   Collection Time: 03/20/17  6:02 AM  Result Value Ref Range   Glucose-Capillary 151 (H) 65 - 99 mg/dL   Comment 1 Notify RN    Comment 2 Document in Chart   Glucose, capillary     Status: Abnormal   Collection Time: 03/20/17  4:58 PM  Result Value Ref Range   Glucose-Capillary 236 (H) 65 - 99 mg/dL  Glucose, capillary     Status: Abnormal   Collection Time: 03/21/17  6:30 AM  Result Value Ref Range   Glucose-Capillary 184 (H) 65 - 99 mg/dL   Comment 1 Notify RN    Comment 2 Document in Chart   Glucose, capillary     Status: Abnormal   Collection Time: 03/21/17 12:04 PM  Result Value Ref Range   Glucose-Capillary 156 (H) 65 - 99 mg/dL   Blood Alcohol level:  Lab Results  Component Value Date   ETH <5 03/15/2017   ETH <5 10/26/2016   Metabolic Disorder Labs: Lab Results  Component Value Date   HGBA1C 12.0 (H) 10/29/2016   MPG 298 10/29/2016   MPG 278 (H) 12/06/2013   No results found for: PROLACTIN No results found for: CHOL, TRIG, HDL, CHOLHDL, VLDL,  LDLCALC  Physical Findings: AIMS: Facial and Oral Movements Muscles of Facial Expression: None, normal Lips and Perioral Area: None, normal Jaw: None, normal Tongue: None, normal,Extremity Movements Upper (arms, wrists, hands, fingers): None, normal Lower (legs, knees, ankles, toes): None, normal, Trunk Movements Neck, shoulders, hips: None, normal, Overall Severity Severity of abnormal movements (highest score from questions above): None, normal Incapacitation due to abnormal movements: None, normal Patient's awareness of abnormal movements (rate only patient's report): No Awareness, Dental Status Current problems with teeth and/or dentures?: No Does patient usually wear dentures?: No  CIWA:  CIWA-Ar Total: 0 COWS:     Musculoskeletal: Strength & Muscle Tone: within normal limits Gait & Station: normal Patient leans: N/A  Psychiatric Specialty Exam: Physical Exam  Review of Systems  Psychiatric/Behavioral: Positive for depression ("Improved") and substance abuse (Hx. Cocaine use disorder.). Negative for hallucinations, memory loss and suicidal ideas. The patient has insomnia ("Improved"). The patient is not nervous/anxious.     Blood pressure (!) 130/96, pulse 87, temperature 98.3 F (36.8 C), temperature source Oral, resp. rate 18, height 5\' 9"  (1.753 m), weight (!) 142 kg (313 lb), SpO2 96 %.Body mass index is 46.22 kg/m.  General Appearance: Casual and unremarkable, morbidly obese  Eye Contact:  Good  Speech:  Clear and Coherent and Normal Rate  Volume:  Normal  Mood:  Euthymic  Affect:  Congruent  Thought Process:  Coherent, Goal Directed and Descriptions of Associations: Intact  Orientation:  Full (Time, Place, and Person)  Thought Content:  Logical  Suicidal Thoughts:  Denies any thoughts, plans or intent.  Homicidal Thoughts:  Denies any thoughyts, plans or intent.  Memory:  Immediate;   Good Recent;   Good Remote;   Fair  Judgement:  Good  Insight:  Good   Psychomotor Activity:  Negative  Concentration:  Concentration: Good and Attention Span: Good  Recall:  Good  Fund of Knowledge:  Good  Language:  Good  Akathisia:  Negative  Handed:  Right  AIMS (if indicated):     Assets:  Communication Skills Desire for Improvement Social Support  ADL's:  Intact  Cognition:  WNL  Sleep:  Number of Hours: 6.25   Treatment Plan Summary: Patient is currently at her baseline. No evidence of psychosis. No evidence of mania. No dangerousness to self or others. We are finalizing aftercare as she is scheduled to be.  Will continue today 03/21/17 plan as below except where it is noted.   Continue Gabapentin (NEURONTIN) 400 mg three times daily for agitation/neuropathic pain.  Continue  Citalopram (Celexa) tablet 40 mgs daily for depression  Conrinue Mirtazapine (Remeron) 15 mg Q nightly for insomnia   Continue Lorazepam (Ativan) tablet 0.5 mg Q6h PRN  for anxiety.  Continue all other medical medications recommended.  Disposition plans as per primary team and CSW on the unit.  Sanjuana KavaNwoko, Agnes I, NP, PMHNP, FNP-BC 03/21/2017, 3:03 PM  Patient ID: Glori BickersAllison B Branaman, female   DOB: 01/07/1966, 51 y.o.   MRN: [id number] Agree with NP Progress Note

## 2017-03-21 NOTE — [facility] Group Notes (Signed)
[facility] LCSW Group Therapy  03/21/2017 3:32 PM  Type of Therapy:  Group Therapy  Participation Level:  Active  Participation Quality:  Attentive  Affect:  Appropriate  Cognitive:  Alert and Oriented  Insight:  Improving  Engagement in Therapy:  Improving  Modes of Intervention:  Discussion, Education, Exploration, Socialization and Support  Summary of Progress/Problems: Today's Topic: Overcoming Obstacles. Patients identified one short term goal and potential obstacles in reaching this goal. Patients processed barriers involved in overcoming these obstacles. Patients identified steps necessary for overcoming these obstacles and explored motivation (internal and external) for facing these difficulties head on.   Jennice Renegar N Smart LCSW 03/21/2017, 3:32 PM

## 2017-03-21 NOTE — Progress Notes (Signed)
Recreation Therapy Notes  Date: 03/21/17 Time: 0930 Location: [address] Group Room  Group Topic: Stress Management  Goal Area(s) Addresses:  Patient will verbalize importance of using healthy stress management.  Patient will identify positive emotions associated with healthy stress management.   Intervention: Stress Management  Activity :  Guided Imagery.  LRT introduced the stress management technique of guided imagery.  LRT read Erickson script to allow patients to engage in the technique.  Patients were to follow along as the script was read to fully participate.  Education:  Stress Management, Discharge Planning.   Education Outcome: Acknowledges edcuation/In group clarification offered/Needs additional education  Clinical Observations/Feedback: Pt did not attend group.   Branch Pacitti, LRT/CTRS         Claudia Erickson 03/21/2017 2:26 PM 

## 2017-03-22 DIAGNOSIS — T43212A Poisoning by selective serotonin and norepinephrine reuptake inhibitors, intentional self-harm, initial encounter: Secondary | ICD-10-CM

## 2017-03-22 DIAGNOSIS — T1491XA Suicide attempt, initial encounter: Secondary | ICD-10-CM

## 2017-03-22 LAB — GLUCOSE, CAPILLARY: Glucose-Capillary: 165 mg/dL — ABNORMAL HIGH (ref 65–99)

## 2017-03-22 LAB — HEMOGLOBIN A1C
Hgb A1c MFr Bld: 10 % — ABNORMAL HIGH (ref 4.8–5.6)
MEAN PLASMA GLUCOSE: 240 mg/dL

## 2017-03-22 MED ORDER — INSULIN ASPART PROT & ASPART (70-30 MIX) 100 UNIT/ML ~~LOC~~ SUSP: SUBCUTANEOUS | 1 refills | Status: DC

## 2017-03-22 MED ORDER — LORAZEPAM 0.5 MG PO TABS: 0.5000 mg | ORAL_TABLET | Freq: Four times a day (QID) | ORAL | 0 refills | Status: DC | PRN

## 2017-03-22 MED ORDER — NICOTINE 21 MG/24HR TD PT24: 21.0000 mg | MEDICATED_PATCH | Freq: Every day | TRANSDERMAL | 0 refills | Status: DC

## 2017-03-22 MED ORDER — CITALOPRAM HYDROBROMIDE 40 MG PO TABS: 40.0000 mg | ORAL_TABLET | Freq: Every day | ORAL | 0 refills | Status: DC

## 2017-03-22 MED ORDER — MIRTAZAPINE 15 MG PO TABS: 15.0000 mg | ORAL_TABLET | Freq: Every day | ORAL | 0 refills | Status: DC

## 2017-03-22 MED ORDER — HYDROCHLOROTHIAZIDE 25 MG PO TABS: 25.0000 mg | ORAL_TABLET | Freq: Every day | ORAL | 1 refills | Status: AC

## 2017-03-22 MED ORDER — GABAPENTIN 400 MG PO CAPS: 400.0000 mg | ORAL_CAPSULE | Freq: Three times a day (TID) | ORAL | 0 refills | Status: DC

## 2017-03-22 MED ORDER — METFORMIN HCL 850 MG PO TABS: 850.0000 mg | ORAL_TABLET | Freq: Two times a day (BID) | ORAL | 0 refills | Status: AC

## 2017-03-22 MED ORDER — OMEPRAZOLE MAGNESIUM 20 MG PO TBEC: 20.0000 mg | DELAYED_RELEASE_TABLET | Freq: Every day | ORAL | 0 refills | Status: AC

## 2017-03-22 NOTE — Tx Team (Signed)
Interdisciplinary Treatment and Diagnostic Plan Update  03/22/2017 Time of Session:  0930am Claudia Erickson MRN: [id number]  Principal Diagnosis: Major Depressive Disorder  Secondary Diagnoses: Active Problems:   Major depressive disorder, recurrent severe without psychotic features ([place])   Current Medications:  Current Facility-Administered Medications  Medication Dose Route Frequency Provider Last Rate Last Dose  . acetaminophen (TYLENOL) tablet 650 mg  650 mg Oral Q4H PRN Patrecia Pour, NP   650 mg at 03/20/17 1704  . alum & mag hydroxide-simeth (MAALOX/MYLANTA) 200-200-20 MG/5ML suspension 30 mL  30 mL Oral Q4H PRN Patrecia Pour, NP      . citalopram (CELEXA) tablet 40 mg  40 mg Oral Daily Ambrose Finland, MD   40 mg at 03/22/17 0840  . gabapentin (NEURONTIN) capsule 400 mg  400 mg Oral TID Cobos, Myer Peer, MD   400 mg at 03/22/17 0840  . hydrochlorothiazide (HYDRODIURIL) tablet 25 mg  25 mg Oral Daily Patrecia Pour, NP   25 mg at 03/22/17 0840  . insulin aspart (novoLOG) injection 0-15 Units  0-15 Units Subcutaneous TID WC Derrill Center, NP   3 Units at 03/22/17 [phone]  . insulin aspart (novoLOG) injection 0-5 Units  0-5 Units Subcutaneous QHS Derrill Center, NP   2 Units at 03/21/17 2125  . insulin aspart protamine- aspart (NOVOLOG MIX 70/30) injection 35 Units  35 Units Subcutaneous Q supper Derrill Center, NP   35 Units at 03/21/17 1719  . insulin aspart protamine- aspart (NOVOLOG MIX 70/30) injection 40 Units  40 Units Subcutaneous Q breakfast Patrecia Pour, NP   40 Units at 03/22/17 0847  . LORazepam (ATIVAN) tablet 0.5 mg  0.5 mg Oral Q6H PRN Cobos, Myer Peer, MD   0.5 mg at 03/22/17 0634  . magnesium hydroxide (MILK OF MAGNESIA) suspension 30 mL  30 mL Oral Daily PRN Patrecia Pour, NP      . metFORMIN (GLUCOPHAGE) tablet 850 mg  850 mg Oral BID WC Patrecia Pour, NP   850 mg at 03/22/17 0840  . mirtazapine (REMERON) tablet 15 mg  15 mg Oral QHS  Ambrose Finland, MD   15 mg at 03/21/17 2124  . nicotine (NICODERM CQ - dosed in mg/24 hours) patch 21 mg  21 mg Transdermal Daily Patrecia Pour, NP   21 mg at 03/22/17 0841  . ondansetron (ZOFRAN) tablet 4 mg  4 mg Oral Q8H PRN Patrecia Pour, NP      . pantoprazole (PROTONIX) EC tablet 40 mg  40 mg Oral Daily Patrecia Pour, NP   40 mg at 03/22/17 0840   PTA Medications: Prescriptions Prior to Admission  Medication Sig Dispense Refill Last Dose  . ARIPiprazole (ABILIFY) 5 MG tablet Take 1 tablet (5 mg total) by mouth daily. For mood stability 30 tablet 11 More than a month at Unknown time  . citalopram (CELEXA) 20 MG tablet 1 tab by mouth daily for depression 30 tablet 11 More than a month at Unknown time  . gabapentin (NEURONTIN) 100 MG capsule 4 caps by mouth 3 times daily for agitation/diabetic neuropathy 360 capsule 11 More than a month at Unknown time  . hydrochlorothiazide (HYDRODIURIL) 25 MG tablet Take 1 tablet (25 mg total) by mouth daily. 30 tablet 11 More than a month at Unknown time  . insulin aspart protamine- aspart (NOVOLOG MIX 70/30) (70-30) 100 UNIT/ML injection Inject 0.3 mLs (30 Units total) into the skin daily with supper. 10 mL  11 More than a month at Unknown time  . insulin aspart protamine- aspart (NOVOLOG MIX 70/30) (70-30) 100 UNIT/ML injection Inject 0.4 mLs (40 Units total) into the skin daily with breakfast. 10 mL 11 More than a month at Unknown time  . loratadine (CLARITIN) 10 MG tablet Take 10 mg by mouth daily.   More than a month at Unknown time  . LORazepam (ATIVAN) 1 MG tablet Take 1 tablet (1 mg total) by mouth every 8 (eight) hours as needed for anxiety. 12 tablet 0 More than a month at Unknown time  . meloxicam (MOBIC) 15 MG tablet Take 1 tablet (15 mg total) by mouth daily. 30 tablet 11 More than a month at Unknown time  . metFORMIN (GLUCOPHAGE) 850 MG tablet Take 1 tablet (850 mg total) by mouth 2 (two) times daily with a meal. For diabetes  management 60 tablet 11 More than a month at Unknown time  . mirtazapine (REMERON) 7.5 MG tablet Take 1 tablet (7.5 mg total) by mouth at bedtime. For insomnia 30 tablet 11 More than a month at Unknown time  . omeprazole (PRILOSEC OTC) 20 MG tablet Take 1 tablet (20 mg total) by mouth daily. For acid reflux 30 tablet 11 More than a month at Unknown time    Patient Stressors: Medication change or noncompliance  Patient Strengths: Ability for insight Active sense of humor Average or above average intelligence General fund of knowledge Work skills  Treatment Modalities: Medication Management, Group therapy, Case management,  1 to 1 session with clinician, Psychoeducation, Recreational therapy.   Physician Treatment Plan for Primary Diagnosis: Major Depressive Disorder Long Term Goal(s): Improvement in symptoms so as ready for discharge Improvement in symptoms so as ready for discharge   Short Term Goals: Ability to verbalize feelings will improve Ability to disclose and discuss suicidal ideas Ability to demonstrate self-control will improve Ability to identify and develop effective coping behaviors will improve Ability to maintain clinical measurements within normal limits will improve Ability to identify triggers associated with substance abuse/mental health issues will improve  Medication Management: Evaluate patient's response, side effects, and tolerance of medication regimen.  Therapeutic Interventions: 1 to 1 sessions, Unit Group sessions and Medication administration.  Evaluation of Outcomes: Met  Physician Treatment Plan for Secondary Diagnosis: Active Problems:   Major depressive disorder, recurrent severe without psychotic features ([place])  Long Term Goal(s): Improvement in symptoms so as ready for discharge Improvement in symptoms so as ready for discharge   Short Term Goals: Ability to verbalize feelings will improve Ability to disclose and discuss suicidal  ideas Ability to demonstrate self-control will improve Ability to identify and develop effective coping behaviors will improve Ability to maintain clinical measurements within normal limits will improve Ability to identify triggers associated with substance abuse/mental health issues will improve     Medication Management: Evaluate patient's response, side effects, and tolerance of medication regimen.  Therapeutic Interventions: 1 to 1 sessions, Unit Group sessions and Medication administration.  Evaluation of Outcomes: Met   RN Treatment Plan for Primary Diagnosis: Major Depressive Disorder Long Term Goal(s): Knowledge of disease and therapeutic regimen to maintain health will improve  Short Term Goals: Ability to remain free from injury will improve, Ability to participate in decision making will improve and Ability to disclose and discuss suicidal ideas   Medication Management: RN will administer medications as ordered by provider, will assess and evaluate patient's response and provide education to patient for prescribed medication. RN will report any adverse  and/or side effects to prescribing provider.  Therapeutic Interventions: 1 on 1 counseling sessions, Psychoeducation, Medication administration, Evaluate responses to treatment, Monitor vital signs and CBGs as ordered, Perform/monitor CIWA, COWS, AIMS and Fall Risk screenings as ordered, Perform wound care treatments as ordered.  Evaluation of Outcomes: Met   LCSW Treatment Plan for Primary Diagnosis: Major Depressive disorder Long Term Goal(s): Safe transition to appropriate next level of care at discharge, Engage patient in therapeutic group addressing interpersonal concerns.  Short Term Goals: Engage patient in aftercare planning with referrals and resources, Increase emotional regulation and Increase skills for wellness and recovery  Therapeutic Interventions: Assess for all discharge needs, 1 to 1 time with Social worker,  Explore available resources and support systems, Assess for adequacy in community support network, Educate family and significant other(s) on suicide prevention, Complete Psychosocial Assessment, Interpersonal group therapy.  Evaluation of Outcomes: Met  Progress in Treatment: Attending groups: Yes. Participating in groups: Yes. Taking medication as prescribed: Yes. Toleration medication: Yes. Family/Significant other contact made: Yes, individual(s) contacted:  father, Dina Rich Patient understands diagnosis: No. Discussing patient identified problems/goals with staff: Yes. Medical problems stabilized or resolved: Yes. Denies suicidal/homicidal ideation: Yes Issues/concerns per patient self-inventory: No. Other: NA  New problem(s) identified: Yes, Describe:  patient has limited finances and no insurance, has struggled to pay for treatment, will refer to providers who can assist; given resources for peer support and [facility], linked w inpt peer support  New Short Term/Long Term Goal(s): increase emotion regulation and ability to appropriately seek support for self; link w recovery oriented community support providers  Discharge Plan or Barriers: none at this time, return home to father and follow up with [facility]-referral made and [facility] [place]. Pt also given [facility] pamphlet per her request and [place] pamphlet/AA/NA information.   Reason for Continuation of Hospitalization: none  Estimated Length of Stay:  03/22/17  Attendees: Patient:   Physician:  Dr. Sanjuana Letters MD 03/22/2017 10:44 AM   Nursing: Quentin Mulling RN 03/22/2017 10:44 AM   RN Care Manager: Addison Naegeli RN CM 03/22/2017 10:44 AM   Social Worker: Maxie Better, [place] 03/22/2017 10:44 AM   Recreational Therapist: x 03/22/2017 10:44 AM   Other: Lindell Spar NP; Catalina Pizza NP 03/22/2017 10:44 AM   Other:    Other:     Scribe for Treatment Team: Kimber Relic Smart, LCSW 03/22/2017 9:13 AM

## 2017-03-22 NOTE — [facility] Suicide Risk Assessment (Signed)
[facility] Discharge Suicide Risk Assessment   Principal Problem: Substance Induced Mood Disorder Discharge Diagnoses:  Patient Active Problem List   Diagnosis Date Noted  . Cocaine abuse [F14.10] 03/16/2017  . Tobacco abuse [Z72.0] 02/02/2017  . Panic attack [F41.0]   . Chronic venous insufficiency [I87.2] 10/28/2016  . Hyponatremia [E87.1] 10/28/2016  . Hyperglycemia [R73.9]   . Intentional drug overdose ([place]) [T50.902A]   . Suicidal ideation [R45.851]   . Urinary frequency [R35.0] 04/08/2015  . Anxiety and depression [F41.9, F32.9] 12/26/2014  . Insomnia [G47.00] 12/26/2014  . Major depressive disorder, recurrent severe without psychotic features ([place]) [F33.2] 08/27/2014  . Aspiration into airway [T17.908A] 12/06/2013  . Aspiration pneumonia ([place]) [J69.0] 12/06/2013  . Polysubstance abuse [F19.10] 12/06/2013  . Diabetes mellitus ([place]) [E11.9] 12/06/2013  . Syncope [R55] 12/06/2013  . Leucocytosis [D72.829] 12/06/2013  . Acute respiratory failure ([place]) [J96.00] 12/06/2013  . Elevated LFTs [R79.89] 12/06/2013    Total Time spent with patient: 45 minutes  Musculoskeletal: Strength & Muscle Tone: within normal limits Gait & Station: normal Patient leans: N/A  Psychiatric Specialty Exam: Review of Systems  Constitutional: Negative.   HENT: Negative.   Eyes: Negative.   Respiratory: Negative.   Cardiovascular: Negative.   Gastrointestinal: Negative.   Genitourinary: Negative.   Musculoskeletal: Negative.   Skin: Negative.   Neurological: Negative.   Endo/Heme/Allergies: Negative.   Psychiatric/Behavioral: Negative for depression, hallucinations, memory loss, substance abuse and suicidal ideas. The patient is not nervous/anxious and does not have insomnia.     Blood pressure 103/74, pulse 98, temperature 98.7 F (37.1 C), resp. rate 18, height 5' 9"  (1.753 m), weight (!) 142 kg (313 lb), SpO2 96 %.Body mass index is 46.22 kg/m.  General Appearance: Neatly dressed, pleasant,  engaging well and cooperative. Appropriate behavior. Not in any distress. Good relatedness. Not internally stimulated  Eye Contact::  Good  Speech:  Spontaneous, normal prosody. Normal tone and rate.   Volume:  Normal  Mood:  Euthymic  Affect:  Appropriate and Full Range  Thought Process:  Goal Directed  Orientation:  Full (Time, Place, and Person)  Thought Content:  Future oriented. No delusional theme. No preoccupation with violent thoughts. No negative ruminations. No obsession.  No hallucination in any modality.   Suicidal Thoughts:  No  Homicidal Thoughts:  No  Memory:  Immediate;   Good Recent;   Good Remote;   Good  Judgement:  Good  Insight:  Good  Psychomotor Activity:  Normal  Concentration:  Good  Recall:  Good  Fund of Knowledge:Good  Language: Good  Akathisia:  Negative  Handed:    AIMS (if indicated):     Assets:  Communication Skills Desire for Improvement Financial Resources/Insurance Housing Resilience Social Support  Sleep:  Number of Hours: 6.25  Cognition: WNL  ADL's:  Intact   Clinical Assessment::   51 yo Caucasian female, divorced, lives with her father. Background history of SUD and Mood Disorder. Self presented for admission. Reported suicidal thoughts at presentation. UDS was positive for cocaine. I met with her for the first time today. Tells me that she has realized that she needs to "grow up and take care of myself ,,,, I cannot be doing this over and over ,,,,, I start self medicating with cocaine and it makes me worse ,,,,,, I have to stay on my medicines" Patient says she is feeling better. She is no longer having suicidal thoughts. Seen today. Reports that she is in good spirits. Not feeling depressed. Reports normal  energy and interest. Has been maintaining normal biological functions. She is able to think clearly. She is able to focus on task. Her thoughts are not  racing. No evidence of mania. No hallucination in any modality. She is not making  any delusional statement. No passivity of will/thought. She is fully in touch with reality. No thoughts of suicide. No thoughts of homicide. No violent thoughts. No overwhelming anxiety. No acces to weapons. No new stressors. She would be going back to her father's place.   Nursing staff reports that patient has been appropriate on the unit. Patient has been interacting well with peers. No behavioral issues. Patient has not voiced any suicidal thoughts. Patient has not been observed to be internally stimulated. Patient has been adherent with treatment recommendations. Patient has been tolerating their medication well.   Patient was discussed at team. Team members feels that patient is back to her baseline level of function. Team agrees with plan to discharge patient today.   Demographic Factors:  Divorced or widowed and Caucasian  Loss Factors: NA  Historical Factors: Impulsivity  Risk Reduction Factors:   Responsible for children under 55 years of age, Sense of responsibility to family, Living with another person, especially a relative, Positive social support, Positive therapeutic relationship and Positive coping skills or problem solving skills  Continued Clinical Symptoms:  As above  Cognitive Features That Contribute To Risk:  None    Suicide Risk:  Minimal: No identifiable suicidal ideation. Patient is not having any thoughts of suicide at this time. Modifiable risk factors targeted during this admission includes depression, anxiety and substance use. Demographical and historical risk factors cannot be modified. Patient is now engaging well. Patient is reliable and is future oriented. We have buffered patient's support structures. At this point, patient is at low risk of suicide. Patient is aware of the effects of psychoactive substances on decision making process. Patient has been provided with emergency contacts. Patient acknowledges to use resources provided if unforseen  circumstances changes their current risk stratification.   Follow-up [place] in 3 day(s).   Specialty:  Professional Counselor Why:  Please use [facility] In Clinic to establish for therapy and medications management services.  Hours are 8:30 - 11 AM.  Arrive early for prompt service. Contact information: [employer] [facility] [place] [address] [phone]        [facility] Follow up.   Why:  Referral made for therapy services. The office will call you at discharge to follow-up and schedule assessment/first appointment. Thank you.  Contact information: [address], [place]  [address] Phone:  [phone] Fax:  [phone] (same as phone)           Plan Of Care/Follow-up recommendations:  1. Continue current psychotropic medications 2. Mental health and addiction follow up as arranged.  3. Discharge in care of her family 4. Provided limited quantity of prescriptions   Artist [place], MD 03/22/2017, 12:31 PM

## 2017-03-22 NOTE — Progress Notes (Addendum)
  [facility] :  Will you be returning to the same living situation after discharge:  Yes,  home At discharge, do you have transportation home?: Yes,  family member on 03/22/17 Do you have the ability to pay for your medications: Yes,  mental health  Release of information consent forms completed and submitted to medical records by CSW.  Patient to Follow up at: Follow-up Information    [facility] [place] in 3 day(s).   Specialty:  Professional Counselor Why:  Please use Walk In Clinic to establish for therapy and medications management services.  Hours are 8:30 - 11 AM.  Arrive early for prompt service. Contact information: [facility] [address] [place] [address] [phone]               [facility] Follow up.   Why:  Referral made for therapy services. The office will call you at discharge to follow-up and schedule assessment/first appointment. Thank you.  Contact information: [address] [place]  [id number] Phone:  [phone] Fax:  [phone] (same as phone)           Next level of care provider has access to [facility] Link:no  Safety Planning and Suicide Prevention discussed: Yes,  SPE completed with pt's father. SPI pamphlet and Mobile Crisis information provided to pt.   Have you used any form of tobacco in the last 30 days? (Cigarettes, Smokeless Tobacco, Cigars, and/or Pipes): Yes  Has patient been referred to the Quitline?: Patient refused referral  Patient has been referred for addiction treatment: Yes  Claudia Francom N Smart LCSW 03/22/2017, 9:12 AM

## 2017-03-22 NOTE — Progress Notes (Signed)
Discharge note: Pt received both written and verbal discharge instructions. Pt verbalized understanding of discharge instructions. Pt agreed to f/u appt and med regimen. Pt received sample meds, prescriptions, AVS, transitional record and SRA. Pt gathered belongings from room and locker. Pt safely discharged to the lobby.

## 2017-03-22 NOTE — Discharge Summary (Signed)
Physician Discharge Summary Note  Patient:  Claudia Erickson is an 51 y.o., female MRN:  [id number] DOB:  [birth year] Patient phone:  [phone] (home)  Patient address:   [place] [address],   Total Time spent with patient: Greater than 30 minutes  Date of Admission:  03/16/2017 Date of Discharge: 03-22-17  Reason for Admission: Suicide attempt by overdose.  Principal Problem: Major depressive disorder, recurrent episode, severe  Discharge Diagnoses: Patient Active Problem List   Diagnosis Date Noted  . Cocaine abuse [F14.10] 03/16/2017  . Tobacco abuse [Z72.0] 02/02/2017  . Panic attack [F41.0]   . Chronic venous insufficiency [I87.2] 10/28/2016  . Hyponatremia [E87.1] 10/28/2016  . Hyperglycemia [R73.9]   . Intentional drug overdose ([place]) [T50.902A]   . Suicidal ideation [R45.851]   . Urinary frequency [R35.0] 04/08/2015  . Anxiety and depression [F41.9, F32.9] 12/26/2014  . Insomnia [G47.00] 12/26/2014  . Major depressive disorder, recurrent severe without psychotic features ([place]) [F33.2] 08/27/2014  . Aspiration into airway [T17.908A] 12/06/2013  . Aspiration pneumonia ([place]) [J69.0] 12/06/2013  . Polysubstance abuse [F19.10] 12/06/2013  . Diabetes mellitus ([place]) [E11.9] 12/06/2013  . Syncope [R55] 12/06/2013  . Leucocytosis [D72.829] 12/06/2013  . Acute respiratory failure ([place]) [J96.00] 12/06/2013  . Elevated LFTs [R79.89] 12/06/2013   Past Psychiatric History: Mdd, intentional drug overdose.  Past Medical History:  Past Medical History:  Diagnosis Date  . Anxiety and depression   . Chronic back pain   . Diabetes mellitus without complication ([place])   . GERD (gastroesophageal reflux disease)   . Hypertension   . Panic attack 1990s  . Peripheral neuropathy   . Positive TB test   . Substance abuse   . Substance induced mood disorder ([place])   . UTI (lower urinary tract infection)     Past Surgical History:  Procedure Laterality Date   . ABDOMINAL HYSTERECTOMY    . CHOLECYSTECTOMY    . ROTATOR CUFF REPAIR    . TONSILLECTOMY     Family History:  Family History  Problem Relation Age of Onset  . Diabetes Mellitus II Father   . Hyperlipidemia Father   . Diabetes Father   . Colon cancer Father 7       not sure of age he was diagnosed  . Arthritis Mother   . Hypertension Mother   . Heart disease Mother   . Drug abuse Son        incarcerated with drug issues  . CAD Neg Hx    Family Psychiatric  History: See H&P  Social History:  History  Alcohol Use No     History  Drug Use  . Types: Cocaine    Comment: Heroine--only used briefly before.    Social History   Social History  . Marital status: Single    Spouse name: N/A  . Number of children: 2  . Years of education: 14   Occupational History  . 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 Smoking status: Current Every Day Smoker    Packs/day: 0.50    Years: 30.00    Types: Cigarettes  . Smokeless tobacco: Never Used  . Alcohol use No  . Drug use: Yes    Types: Cocaine     Comment: Heroine--only used briefly before.  Marland Kitchen Sexual activity: No   Other Topics Concern  . None   Social History Narrative   Born and raised in  [place], [place].   Fun: Doesn't do a thing - never leaves the house.   Denies religious beliefs affecting health care.    Recently moved in with father for caregiver purposes   Not in touch with older son.   Younger son incarcerated for drug related reasons.   Hospital Course: Claudia Erickson is a 51 yo Caucasian female, divorced, lives with her father. Background history of SUD and Mood Disorder. Self presented for admission. Reported suicidal thoughts at presentation. UDS was positive for cocaine. She was in need of mood stabilization treatments.  After her admission assessment, Claudia Erickson was started on medication regimen for her worsening depressive symptoms. Her UDS was positive for  Cocaine on admission. However, she was not presenting with any substance withdrawal symptoms. She did not receive any detoxification treatments. She was medicated & discharged on  Nicotine 21 mg for smoking cessation, Mirtazapine 15 mg for depression/insomnia, Citalopram 40 mg for depression, Gabapentin 400 mg for agitation/neuropathic pain & Ativan 0.5 mg prn for anxiety. She was also enrolled in the group counseling sessions/activities being held on this unit to help her learn coping skills that will aid her achieve & maintain mood stability after discharge.  [name] attended and participated in these activities as recommended.  She also received other medication regimen & monitoring for her other pre-existing medical issues presented. She tolerated her treatment regimen without any significant adverse effects and or reactions reported. Claudia Erickson's symptoms did respond adequately to her treatment plan. This is evidenced by her reports of improved mood, presentation of good affect and reports of symptom reduction.  She met with her attending psychiatrist this a. m. Her treatment plan, reasons for admission and response to treatment regimen were discussed. Claudia Erickson endorsed that she is doing well and ready to be discharged to her home with family. It was agreed upon that she will continue psychiatric care on an outpatient basis as noted below. She was provided with all the necessary information needed to make this appointments without problems.  Upon discharge, Claudia Erickson adamantly denies any SIHI, AVH, delusional thoughts or paranoia. She is provided with a 7 days worth, supply samples of her [facility] discharge medications. She left [facility] in no apparent distress. Transportation per family.  Physical Findings: AIMS: Facial and Oral Movements Muscles of Facial Expression: None, normal Lips and Perioral Area: None, normal Jaw: None, normal Tongue: None, normal,Extremity Movements Upper (arms, wrists, hands,  fingers): None, normal Lower (legs, knees, ankles, toes): None, normal, Trunk Movements Neck, shoulders, hips: None, normal, Overall Severity Severity of abnormal movements (highest score from questions above): None, normal Incapacitation due to abnormal movements: None, normal Patient's awareness of abnormal movements (rate only patient's report): No Awareness, Dental Status Current problems with teeth and/or dentures?: No Does patient usually wear dentures?: No  CIWA:  CIWA-Ar Total: 0 COWS:     Musculoskeletal: Strength & Muscle Tone: within normal limits Gait & Station: normal Patient leans: N/A  Psychiatric Specialty Exam: Physical Exam  Constitutional: She is oriented to person, place, and time. She appears well-developed.  HENT:  Head: Normocephalic.  Eyes: Pupils are equal, round, and reactive to light.  Neck: Normal range of motion.  Cardiovascular: Normal rate.   Respiratory: Effort normal.  GI: Soft.  Genitourinary:  Genitourinary Comments: Deferred  Musculoskeletal: Normal range of motion.  Neurological: She is alert and oriented to person, place, and time.  Skin: Skin is warm and dry.    Review of Systems  Constitutional: Negative.   HENT: Negative.  Eyes: Negative.   Respiratory: Negative.   Cardiovascular: Negative.   Gastrointestinal: Negative.   Genitourinary: Negative.   Musculoskeletal: Negative.   Skin: Negative.   Neurological: Negative.   Endo/Heme/Allergies: Negative.   Psychiatric/Behavioral: Positive for depression (Stable ) and substance abuse (Hx. Amphetamin/Cocaine use disorder. ). Negative for hallucinations, memory loss and suicidal ideas. The patient has insomnia (Stable ). The patient is not nervous/anxious.     Blood pressure 103/74, pulse 98, temperature 98.7 F (37.1 C), resp. rate 18, height 5' 9"  (1.753 m), weight (!) 142 kg (313 lb), SpO2 96 %.Body mass index is 46.22 kg/m.  See Md's SRA   Have you used any form of tobacco in  the last 30 days? (Cigarettes, Smokeless Tobacco, Cigars, and/or Pipes): Yes  Has this patient used any form of tobacco in the last 30 days? (Cigarettes, Smokeless Tobacco, Cigars, and/or Pipes):Yes, provided with a nicotine patch prescription upon discharge for smoking cessation.  Blood Alcohol level:  Lab Results  Component Value Date   ETH <5 03/15/2017   ETH <5 49/20/1007   Metabolic Disorder Labs:  Lab Results  Component Value Date   HGBA1C 10.0 (H) 03/21/2017   MPG 240 03/21/2017   MPG 298 10/29/2016   No results found for: PROLACTIN No results found for: CHOL, TRIG, HDL, CHOLHDL, VLDL, LDLCALC  See Psychiatric Specialty Exam and Suicide Risk Assessment completed by Attending Physician prior to discharge.  Discharge destination:  Home  Is patient on multiple antipsychotic therapies at discharge:  No   Has Patient had three or more failed trials of antipsychotic monotherapy by history:  No  Recommended Plan for Multiple Antipsychotic Therapies: NA  Allergies as of 03/22/2017   No Known Allergies     Medication List    STOP taking these medications   ARIPiprazole 5 MG tablet Commonly known as:  ABILIFY   loratadine 10 MG tablet Commonly known as:  CLARITIN   meloxicam 15 MG tablet Commonly known as:  MOBIC     TAKE these medications     Indication  citalopram 40 MG tablet Commonly known as:  CELEXA Take 1 tablet (40 mg total) by mouth daily. For depression Start taking on:  03/23/2017 What changed:  medication strength  how much to take  how to take this  when to take this  additional instructions  Indication:  Depression   gabapentin 400 MG capsule Commonly known as:  NEURONTIN Take 1 capsule (400 mg total) by mouth 3 (three) times daily. For agitation/Neuropathic pain What changed:  medication strength  how much to take  how to take this  when to take this  additional instructions  Indication:  Agitation, Neuropathic Pain    hydrochlorothiazide 25 MG tablet Commonly known as:  HYDRODIURIL Take 1 tablet (25 mg total) by mouth daily. For high blood pressure Start taking on:  03/23/2017 What changed:  additional instructions  Indication:  High Blood Pressure Disorder   insulin aspart protamine- aspart (70-30) 100 UNIT/ML injection Commonly known as:  NOVOLOG MIX 70/30 Inject 40 units into the skin with breakfast & 35 units in to the Skin at supper: For diabetes management What changed:  how much to take  how to take this  when to take this  additional instructions  Another medication with the same name was removed. Continue taking this medication, and follow the directions you see here.  Indication:  Type 2 Diabetes   LORazepam 0.5 MG tablet Commonly known as:  ATIVAN Take 1 tablet (  0.5 mg total) by mouth every 6 (six) hours as needed for anxiety. What changed:  medication strength  how much to take  when to take this  Indication:  Severe anxiety   metFORMIN 850 MG tablet Commonly known as:  GLUCOPHAGE Take 1 tablet (850 mg total) by mouth 2 (two) times daily with a meal. For diabetes management  Indication:  Type 2 Diabetes   mirtazapine 15 MG tablet Commonly known as:  REMERON Take 1 tablet (15 mg total) by mouth at bedtime. For depression/sleep What changed:  medication strength  how much to take  additional instructions  Indication:  Trouble Sleeping   nicotine 21 mg/24hr patch Commonly known as:  NICODERM CQ - dosed in mg/24 hours Place 1 patch (21 mg total) onto the skin daily. For smoking cessation Start taking on:  03/23/2017  Indication:  Nicotine Addiction   omeprazole 20 MG tablet Commonly known as:  PRILOSEC OTC Take 1 tablet (20 mg total) by mouth daily. For acid reflux  Indication:  Heartburn      Follow-up [place] in 3 day(s).   Specialty:  Professional Counselor Why:  Please use Walk In Clinic to establish for  therapy and medications management services.  Hours are 8:30 - 11 AM.  Arrive early for prompt service. Contact information: [employer] [facility] [place] [address] [phone]        [facility] Follow up.   Why:  Referral made for therapy services. The office will call you at discharge to follow-up and schedule assessment/first appointment. Thank you.  Contact information: [address], [place]  [address] Phone:  [phone] Fax:  [phone] (same as phone)          Follow-up recommendations: Activity:  As tolerated Diet: As recommended by your primary care doctor. Keep all scheduled follow-up appointments as recommended.   Comments: Patient is instructed prior to discharge to: Take all medications as prescribed by his/her mental healthcare provider. Report any adverse effects and or reactions from the medicines to his/her outpatient provider promptly. Patient has been instructed & cautioned: To not engage in alcohol and or illegal drug use while on prescription medicines. In the event of worsening symptoms, patient is instructed to call the crisis hotline, 911 and or go to the nearest ED for appropriate evaluation and treatment of symptoms. To follow-up with his/her primary care provider for your other medical issues, concerns and or health care needs.   Signed: Encarnacion Slates, NP, PMHNP, FNP-BC 03/22/2017, 9:48 AM

## 2017-03-29 NOTE — Progress Notes (Signed)
On  03/15/2017 Claud[facility], she deferred the requisite MSE understanding the inherent risk and benefits and attesting to her understanding by signing the commensurate waiver to not have the MSE completed, prior to her [facility] for continued observation and medical intervention as needed.

## 2017-04-13 ENCOUNTER — Ambulatory Visit (INDEPENDENT_AMBULATORY_CARE_PROVIDER_SITE_OTHER): Payer: Self-pay | Admitting: Internal Medicine

## 2017-04-13 ENCOUNTER — Encounter: Payer: Self-pay | Admitting: Internal Medicine

## 2017-04-13 VITALS — BP 126/80 | HR 80 | Resp 12 | Ht 68.5 in | Wt 313.0 lb

## 2017-04-13 DIAGNOSIS — F329 Major depressive disorder, single episode, unspecified: Secondary | ICD-10-CM

## 2017-04-13 DIAGNOSIS — E118 Type 2 diabetes mellitus with unspecified complications: Secondary | ICD-10-CM

## 2017-04-13 DIAGNOSIS — F419 Anxiety disorder, unspecified: Secondary | ICD-10-CM

## 2017-04-13 DIAGNOSIS — E1142 Type 2 diabetes mellitus with diabetic polyneuropathy: Secondary | ICD-10-CM

## 2017-04-13 DIAGNOSIS — Z794 Long term (current) use of insulin: Secondary | ICD-10-CM

## 2017-04-13 LAB — GLUCOSE, POCT (MANUAL RESULT ENTRY): POC GLUCOSE: 330 mg/dL — AB (ref 70–99)

## 2017-04-13 MED ORDER — GABAPENTIN 400 MG PO CAPS: 400.0000 mg | ORAL_CAPSULE | Freq: Three times a day (TID) | ORAL | 1 refills | Status: DC

## 2017-04-13 MED ORDER — LORAZEPAM 1 MG PO TABS: ORAL_TABLET | ORAL | 0 refills | Status: DC

## 2017-04-26 ENCOUNTER — Other Ambulatory Visit: Payer: Self-pay | Admitting: Internal Medicine

## 2017-04-26 MED ORDER — CITALOPRAM HYDROBROMIDE 40 MG PO TABS: 40.0000 mg | ORAL_TABLET | Freq: Every day | ORAL | 11 refills | Status: DC

## 2017-04-26 MED ORDER — MIRTAZAPINE 15 MG PO TABS: 15.0000 mg | ORAL_TABLET | Freq: Every day | ORAL | 11 refills | Status: DC

## 2017-05-16 ENCOUNTER — Ambulatory Visit: Payer: Self-pay | Admitting: Internal Medicine

## 2017-06-09 ENCOUNTER — Encounter: Payer: Self-pay | Admitting: Internal Medicine

## 2017-06-09 DIAGNOSIS — E1140 Type 2 diabetes mellitus with diabetic neuropathy, unspecified: Secondary | ICD-10-CM

## 2017-06-09 HISTORY — DX: Type 2 diabetes mellitus with diabetic neuropathy, unspecified: E11.40

## 2017-06-09 NOTE — Progress Notes (Signed)
Subjective:    Patient ID: Claudia Erickson, female    DOB: [birth year], 51 y.o.   MRN: [id number]  HPI  This is transcribed from written record by Dr. Rodney Cruise Please refer to scanned written record in Media tab  2 month follow up for Depression--Citalopram changed from 20--40 mg, + pedal edema--HCTZ 25 mg qd, DM  [place] (6/12) follow up (suicidal thoughts)/crack cocaine--transferred to Behavioral Health? Glucose 336, Na = 133, rest ok.  Here to follow up on Depression, diabetes, htn/edema. After her last visit here, she stopped taking ALL her meds! Went to ER --1 month later on 6/12 with suicidal thoughts.  BMP Checked (K+ + Na+ ok) and she was admitted to Behavioral Health--1 wk.  Discharged on above meds.  Denies any recent substance use except cigarettes.  Still eating too much sugar and glucoses running in low 200's.  C/o increased neuropathic pain in feet --wants to try higher dose of gabapentin.  Also taking Meloxicam qd.  [place] lorazepam really helps anxiety.  Has been out  X 2 wks (acc to pt, rec'd #12 from hospital)  Requests a RF.  Wants to try harder with diet, ex, wt loss-- thinks a "health coach"  Will help.  Current Meds  Medication Sig  . citalopram (CELEXA) 40 MG tablet Take 1 tablet (40 mg total) by mouth daily. For depression  . gabapentin (NEURONTIN) 400 MG capsule Take 1 capsule (400 mg total) by mouth 3 (three) times daily. Increase by 1 every 3 days as tolerated until taking 2 three times a day.For agitation/Neuropathic pain  . hydrochlorothiazide (HYDRODIURIL) 25 MG tablet Take 1 tablet (25 mg total) by mouth daily. For high blood pressure  . insulin aspart protamine- aspart (NOVOLOG MIX 70/30) (70-30) 100 UNIT/ML injection Inject 40 units into the skin with breakfast & 35 units in to the Skin at supper: For diabetes management (Patient taking differently: Inject 40 units into the skin with breakfast & 30 units in to the Skin at supper: For diabetes management)   . LORazepam (ATIVAN) 1 MG tablet 1/2-1 tablet by mouth as needed  . metFORMIN (GLUCOPHAGE) 850 MG tablet Take 1 tablet (850 mg total) by mouth 2 (two) times daily with a meal. For diabetes management  . mirtazapine (REMERON) 15 MG tablet Take 1 tablet (15 mg total) by mouth at bedtime. For depression/sleep  . omeprazole (PRILOSEC OTC) 20 MG tablet Take 1 tablet (20 mg total) by mouth daily. For acid reflux  . [DISCONTINUED] citalopram (CELEXA) 40 MG tablet Take 1 tablet (40 mg total) by mouth daily. For depression  . [DISCONTINUED] gabapentin (NEURONTIN) 400 MG capsule Take 1 capsule (400 mg total) by mouth 3 (three) times daily. For agitation/Neuropathic pain  . [DISCONTINUED] gabapentin (NEURONTIN) 400 MG capsule Take 1 capsule (400 mg total) by mouth 3 (three) times daily. Increase by 1 every 3 days as tolerated until taking 2 three times a day.For agitation/Neuropathic pain  . [DISCONTINUED] LORazepam (ATIVAN) 0.5 MG tablet Take 1 tablet (0.5 mg total) by mouth every 6 (six) hours as needed for anxiety.  . [DISCONTINUED] mirtazapine (REMERON) 15 MG tablet Take 1 tablet (15 mg total) by mouth at bedtime. For depression/sleep    No Known Allergies  Review of Systems     Objective:   Physical Exam WNWD, NAD Skin:  No rash Neck:  Tenderness R side of neck.  No adenopathy (?  Gular) Chest:  Clear CV;  RRR  (-) M Abd/pelvis:  NT  Extremities:  Trace- 1+ edema bilaterally Psych:  Good eye contact, normal affect       Assessment & Plan:  1.  Depression--much improved--continue Citalopram 40 mg qd  Remeron 15 mg q hs.  2.  Diabetes--not controlled (200's) Noncompliant with diet--refer for Nutrition (Diabetes) Education. Continue Metformin 850 mg bid + Novolin 40 q am/30 q pm  3.  Diabetic Neuropathy--Increase Gabapentin(400) to 1,1,2 qd up to 2 tid.  May increase every 3 days by 1. #180/1 RF  4.  Anxiety RF--Lorazepam 1 mg 1/2-1 po prn #10 (ten) NRF  F/U Dr. Darnelle BosMulberry1 month

## 2017-08-31 ENCOUNTER — Other Ambulatory Visit: Payer: Self-pay

## 2017-08-31 MED ORDER — GABAPENTIN 400 MG PO CAPS: 400.0000 mg | ORAL_CAPSULE | Freq: Three times a day (TID) | ORAL | 1 refills | Status: DC

## 2017-10-27 ENCOUNTER — Encounter (HOSPITAL_COMMUNITY): Payer: Self-pay | Admitting: Emergency Medicine

## 2017-10-27 ENCOUNTER — Emergency Department (HOSPITAL_COMMUNITY): Payer: Self-pay

## 2017-10-27 ENCOUNTER — Inpatient Hospital Stay (HOSPITAL_COMMUNITY)
Admission: EM | Admit: 2017-10-27 | Discharge: 2017-10-29 | DRG: 917 | Disposition: A | Discharge status: Other Acute Inpatient Hospital | Payer: Self-pay | Attending: Internal Medicine | Admitting: Internal Medicine

## 2017-10-27 ENCOUNTER — Other Ambulatory Visit: Payer: Self-pay

## 2017-10-27 DIAGNOSIS — F410 Panic disorder [episodic paroxysmal anxiety] without agoraphobia: Secondary | ICD-10-CM | POA: Diagnosis present

## 2017-10-27 DIAGNOSIS — F332 Major depressive disorder, recurrent severe without psychotic features: Secondary | ICD-10-CM | POA: Diagnosis present

## 2017-10-27 DIAGNOSIS — E1110 Type 2 diabetes mellitus with ketoacidosis without coma: Secondary | ICD-10-CM | POA: Diagnosis present

## 2017-10-27 DIAGNOSIS — E119 Type 2 diabetes mellitus without complications: Secondary | ICD-10-CM

## 2017-10-27 DIAGNOSIS — F32A Depression, unspecified: Secondary | ICD-10-CM | POA: Diagnosis present

## 2017-10-27 DIAGNOSIS — E1140 Type 2 diabetes mellitus with diabetic neuropathy, unspecified: Secondary | ICD-10-CM | POA: Diagnosis present

## 2017-10-27 DIAGNOSIS — G4700 Insomnia, unspecified: Secondary | ICD-10-CM | POA: Diagnosis present

## 2017-10-27 DIAGNOSIS — G92 Toxic encephalopathy: Secondary | ICD-10-CM | POA: Diagnosis present

## 2017-10-27 DIAGNOSIS — E118 Type 2 diabetes mellitus with unspecified complications: Secondary | ICD-10-CM

## 2017-10-27 DIAGNOSIS — Z915 Personal history of self-harm: Secondary | ICD-10-CM

## 2017-10-27 DIAGNOSIS — R0902 Hypoxemia: Secondary | ICD-10-CM

## 2017-10-27 DIAGNOSIS — T50901A Poisoning by unspecified drugs, medicaments and biological substances, accidental (unintentional), initial encounter: Secondary | ICD-10-CM | POA: Diagnosis present

## 2017-10-27 DIAGNOSIS — T40602A Poisoning by unspecified narcotics, intentional self-harm, initial encounter: Secondary | ICD-10-CM

## 2017-10-27 DIAGNOSIS — R45851 Suicidal ideations: Secondary | ICD-10-CM | POA: Diagnosis present

## 2017-10-27 DIAGNOSIS — Z833 Family history of diabetes mellitus: Secondary | ICD-10-CM

## 2017-10-27 DIAGNOSIS — Z590 Homelessness: Secondary | ICD-10-CM

## 2017-10-27 DIAGNOSIS — I10 Essential (primary) hypertension: Secondary | ICD-10-CM | POA: Diagnosis present

## 2017-10-27 DIAGNOSIS — T50902A Poisoning by unspecified drugs, medicaments and biological substances, intentional self-harm, initial encounter: Secondary | ICD-10-CM

## 2017-10-27 DIAGNOSIS — F419 Anxiety disorder, unspecified: Secondary | ICD-10-CM

## 2017-10-27 DIAGNOSIS — K219 Gastro-esophageal reflux disease without esophagitis: Secondary | ICD-10-CM | POA: Diagnosis present

## 2017-10-27 DIAGNOSIS — T1491XA Suicide attempt, initial encounter: Secondary | ICD-10-CM

## 2017-10-27 DIAGNOSIS — T404X2A Poisoning by other synthetic narcotics, intentional self-harm, initial encounter: Principal | ICD-10-CM | POA: Diagnosis present

## 2017-10-27 DIAGNOSIS — F329 Major depressive disorder, single episode, unspecified: Secondary | ICD-10-CM

## 2017-10-27 DIAGNOSIS — R739 Hyperglycemia, unspecified: Secondary | ICD-10-CM | POA: Diagnosis present

## 2017-10-27 DIAGNOSIS — F1910 Other psychoactive substance abuse, uncomplicated: Secondary | ICD-10-CM | POA: Diagnosis present

## 2017-10-27 DIAGNOSIS — Z794 Long term (current) use of insulin: Secondary | ICD-10-CM

## 2017-10-27 DIAGNOSIS — F17210 Nicotine dependence, cigarettes, uncomplicated: Secondary | ICD-10-CM | POA: Diagnosis present

## 2017-10-27 DIAGNOSIS — T424X2A Poisoning by benzodiazepines, intentional self-harm, initial encounter: Secondary | ICD-10-CM | POA: Diagnosis present

## 2017-10-27 LAB — RAPID URINE DRUG SCREEN, HOSP PERFORMED
Amphetamines: NOT DETECTED
Barbiturates: NOT DETECTED
Benzodiazepines: NOT DETECTED
Cocaine: POSITIVE — AB
OPIATES: NOT DETECTED
Tetrahydrocannabinol: NOT DETECTED

## 2017-10-27 LAB — BLOOD GAS, VENOUS
Acid-base deficit: 5.9 mmol/L — ABNORMAL HIGH (ref 0.0–2.0)
BICARBONATE: 22.1 mmol/L (ref 20.0–28.0)
Drawn by: 257881
O2 Saturation: 53.5 %
PCO2 VEN: 55.5 mmHg (ref 44.0–60.0)
Patient temperature: 98.6
pH, Ven: 7.225 — ABNORMAL LOW (ref 7.250–7.430)

## 2017-10-27 LAB — I-STAT CHEM 8, ED
BUN: 17 mg/dL (ref 6–20)
CHLORIDE: 99 mmol/L — AB (ref 101–111)
Calcium, Ion: 1.07 mmol/L — ABNORMAL LOW (ref 1.15–1.40)
Creatinine, Ser: 0.9 mg/dL (ref 0.44–1.00)
Glucose, Bld: 638 mg/dL (ref 65–99)
HEMATOCRIT: 49 % — AB (ref 36.0–46.0)
Hemoglobin: 16.7 g/dL — ABNORMAL HIGH (ref 12.0–15.0)
POTASSIUM: 4.9 mmol/L (ref 3.5–5.1)
Sodium: 133 mmol/L — ABNORMAL LOW (ref 135–145)
TCO2: 23 mmol/L (ref 22–32)

## 2017-10-27 LAB — COMPREHENSIVE METABOLIC PANEL
ALK PHOS: 119 U/L (ref 38–126)
ALT: 299 U/L — ABNORMAL HIGH (ref 14–54)
ANION GAP: 10 (ref 5–15)
AST: 520 U/L — ABNORMAL HIGH (ref 15–41)
Albumin: 3.7 g/dL (ref 3.5–5.0)
BUN: 16 mg/dL (ref 6–20)
CALCIUM: 8.9 mg/dL (ref 8.9–10.3)
CO2: 25 mmol/L (ref 22–32)
Chloride: 99 mmol/L — ABNORMAL LOW (ref 101–111)
Creatinine, Ser: 1.04 mg/dL — ABNORMAL HIGH (ref 0.44–1.00)
GFR calc non Af Amer: >60 mL/min (ref >60)
Glucose, Bld: 610 mg/dL (ref 65–99)
Potassium: 4.7 mmol/L (ref 3.5–5.1)
SODIUM: 134 mmol/L — AB (ref 135–145)
Total Bilirubin: 0.3 mg/dL (ref 0.3–1.2)
Total Protein: 7.2 g/dL (ref 6.5–8.1)

## 2017-10-27 LAB — BASIC METABOLIC PANEL
ANION GAP: 8 (ref 5–15)
BUN: 15 mg/dL (ref 6–20)
CALCIUM: 8.8 mg/dL — AB (ref 8.9–10.3)
CHLORIDE: 105 mmol/L (ref 101–111)
CO2: 25 mmol/L (ref 22–32)
Creatinine, Ser: 0.73 mg/dL (ref 0.44–1.00)
GFR calc non Af Amer: >60 mL/min (ref >60)
Glucose, Bld: 248 mg/dL — ABNORMAL HIGH (ref 65–99)
POTASSIUM: 3.3 mmol/L — AB (ref 3.5–5.1)
Sodium: 138 mmol/L (ref 135–145)

## 2017-10-27 LAB — ACETAMINOPHEN LEVEL: Acetaminophen (Tylenol), Serum: <10 ug/mL — ABNORMAL LOW (ref 10–30)

## 2017-10-27 LAB — GLUCOSE, CAPILLARY
GLUCOSE-CAPILLARY: 158 mg/dL — AB (ref 65–99)
Glucose-Capillary: 171 mg/dL — ABNORMAL HIGH (ref 65–99)
Glucose-Capillary: 183 mg/dL — ABNORMAL HIGH (ref 65–99)
Glucose-Capillary: 268 mg/dL — ABNORMAL HIGH (ref 65–99)

## 2017-10-27 LAB — CBC
HCT: 47.6 % — ABNORMAL HIGH (ref 36.0–46.0)
HEMOGLOBIN: 16 g/dL — AB (ref 12.0–15.0)
MCH: 29 pg (ref 26.0–34.0)
MCHC: 33.6 g/dL (ref 30.0–36.0)
MCV: 86.2 fL (ref 78.0–100.0)
PLATELETS: 199 10*3/uL (ref 150–400)
RBC: 5.52 MIL/uL — ABNORMAL HIGH (ref 3.87–5.11)
RDW: 13.9 % (ref 11.5–15.5)
WBC: 17.9 10*3/uL — ABNORMAL HIGH (ref 4.0–10.5)

## 2017-10-27 LAB — CBG MONITORING, ED
GLUCOSE-CAPILLARY: 395 mg/dL — AB (ref 65–99)
Glucose-Capillary: 586 mg/dL (ref 65–99)
Glucose-Capillary: 329 mg/dL — ABNORMAL HIGH (ref 65–99)

## 2017-10-27 LAB — SALICYLATE LEVEL

## 2017-10-27 LAB — ETHANOL: Alcohol, Ethyl (B): <10 mg/dL (ref <10)

## 2017-10-27 MED ORDER — SODIUM CHLORIDE 0.9 % IV SOLN: INTRAVENOUS | Status: DC

## 2017-10-27 MED ORDER — INSULIN GLARGINE 100 UNIT/ML ~~LOC~~ SOLN
10.0000 [IU] | Freq: Once | SUBCUTANEOUS | Status: AC
  Administered 2017-10-27: 10 [IU] via SUBCUTANEOUS
  Filled 2017-10-27: qty 0.1

## 2017-10-27 MED ORDER — INSULIN ASPART 100 UNIT/ML ~~LOC~~ SOLN
0.0000 [IU] | SUBCUTANEOUS | Status: DC
  Administered 2017-10-28: 7 [IU] via SUBCUTANEOUS
  Administered 2017-10-28: 4 [IU] via SUBCUTANEOUS
  Administered 2017-10-28: 11 [IU] via SUBCUTANEOUS
  Administered 2017-10-28: 3 [IU] via SUBCUTANEOUS
  Administered 2017-10-28: 11 [IU] via SUBCUTANEOUS
  Administered 2017-10-28: 7 [IU] via SUBCUTANEOUS
  Administered 2017-10-29: 4 [IU] via SUBCUTANEOUS
  Administered 2017-10-29: 15 [IU] via SUBCUTANEOUS
  Administered 2017-10-29: 4 [IU] via SUBCUTANEOUS

## 2017-10-27 MED ORDER — DEXTROSE-NACL 5-0.45 % IV SOLN
INTRAVENOUS | Status: DC
  Administered 2017-10-27: 22:00:00 via INTRAVENOUS

## 2017-10-27 MED ORDER — SODIUM CHLORIDE 0.9 % IV SOLN
INTRAVENOUS | Status: DC
  Administered 2017-10-27: 16:00:00 via INTRAVENOUS

## 2017-10-27 MED ORDER — SODIUM CHLORIDE 0.9 % IV BOLUS (SEPSIS)
1000.0000 mL | Freq: Once | INTRAVENOUS | Status: AC
  Administered 2017-10-27: 1000 mL via INTRAVENOUS

## 2017-10-27 MED ORDER — DEXTROSE-NACL 5-0.45 % IV SOLN: INTRAVENOUS | Status: DC

## 2017-10-27 MED ORDER — INSULIN REGULAR HUMAN 100 UNIT/ML IJ SOLN
INTRAMUSCULAR | Status: DC
  Administered 2017-10-27: 5.4 [IU]/h via INTRAVENOUS
  Administered 2017-10-27: 5.3 [IU]/h via INTRAVENOUS
  Filled 2017-10-27 (×2): qty 1

## 2017-10-27 MED ORDER — ENOXAPARIN SODIUM 40 MG/0.4ML ~~LOC~~ SOLN
40.0000 mg | SUBCUTANEOUS | Status: DC
  Administered 2017-10-27 – 2017-10-28 (×2): 40 mg via SUBCUTANEOUS
  Filled 2017-10-27 (×2): qty 0.4

## 2017-10-27 MED ORDER — POTASSIUM CHLORIDE 10 MEQ/100ML IV SOLN
10.0000 meq | INTRAVENOUS | Status: AC
  Administered 2017-10-27 (×2): 10 meq via INTRAVENOUS
  Filled 2017-10-27: qty 100

## 2017-10-27 MED ORDER — SODIUM CHLORIDE 0.9 % IV SOLN
INTRAVENOUS | Status: DC
  Administered 2017-10-27: 21:00:00 via INTRAVENOUS

## 2017-10-27 NOTE — ED Notes (Signed)
ED TO INPATIENT HANDOFF REPORT  Name/Age/Gender Claudia Erickson 52 y.o. female  Code Status Code Status History    Date Active Date Inactive Code Status Order ID Comments User Context   03/16/2017 18:25 03/22/2017 21:01 Full Code [id number]  Patrecia Pour, NP Inpatient   03/15/2017 23:06 03/16/2017 17:19 Full Code [id number]  Daleen Bo, MD ED   10/27/2016 16:21 11/05/2016 19:05 Full Code [id number]  Consuello Closs, NP Inpatient   10/26/2016 15:17 10/27/2016 15:25 Full Code [id number]  Delos Haring, PA-C ED   08/27/2014 21:39 08/28/2014 15:51 Full Code [id number]  Laverle Hobby, PA-C Inpatient   08/27/2014 14:43 08/27/2014 21:39 Full Code [id number]  Francine Graven, DO ED   07/27/2014 16:58 07/28/2014 17:20 Full Code [id number]  Starlyn Skeans, PA-C ED   12/06/2013 02:21 12/07/2013 18:51 Full Code [id number]  Rise Patience, MD ED      Home/SNF/Other Home  Chief Complaint od  Level of Care/Admitting Diagnosis ED Disposition    ED Disposition Condition [facility]: [place] [100102]  Level of Care: Stepdown [14]  Admit to SDU based on following criteria: Hemodynamic compromise or significant risk of instability:  Patient requiring short term acute titration and management of vasoactive drips, and invasive monitoring (i.e., CVP and Arterial line).  Admit to SDU based on following criteria: Respiratory Distress:  Frequent assessment and/or intervention to maintain adequate ventilation/respiration, pulmonary toilet, and respiratory treatment.  Diagnosis: Overdose [353614]  Admitting Physician: Mordecai Maes  Attending Physician: Caren Griffins [phone]  Estimated length of stay: past midnight tomorrow  Certification:: I certify this patient will need inpatient services for at least 2 midnights  PT Class (Do Not Modify): Inpatient [101]  PT Acc Code (Do Not Modify): Private [1]       Medical History Past Medical  History:  Diagnosis Date  . Anxiety and depression   . Chronic back pain   . Diabetes mellitus without complication ([place])   . Diabetic neuropathy ([place]) 06/09/2017  . GERD (gastroesophageal reflux disease)   . Hypertension   . Panic attack 1990s  . Peripheral neuropathy   . Positive TB test   . Substance abuse ([place])   . Substance induced mood disorder ([place])   . UTI (lower urinary tract infection)     Allergies No Known Allergies  IV Location/Drains/Wounds Patient Lines/Drains/Airways Status   Active Line/Drains/Airways    Name:   Placement date:   Placement time:   Site:   Days:   Peripheral IV 10/27/17 Left Wrist   10/27/17    1320    Wrist   less than 1   Peripheral IV 10/27/17 Right Antecubital   10/27/17    1503    Antecubital   less than 1          Labs/Imaging Results for orders placed or performed during the hospital encounter of 10/27/17 (from the past 48 hour(s))  Comprehensive metabolic panel     Status: Abnormal   Collection Time: 10/27/17  2:18 PM  Result Value Ref Range   Sodium 134 (L) 135 - 145 mmol/L   Potassium 4.7 3.5 - 5.1 mmol/L   Chloride 99 (L) 101 - 111 mmol/L   CO2 25 22 - 32 mmol/L   Glucose, Bld 610 (HH) 65 - 99 mg/dL    Comment: CRITICAL RESULT CALLED TO, READ BACK BY AND VERIFIED WITH: MILNER,Q. RN _0  ON 01.24.19 BY COHEN,K    BUN 16 6 -  20 mg/dL   Creatinine, Ser 1.04 (H) 0.44 - 1.00 mg/dL   Calcium 8.9 8.9 - 10.3 mg/dL   Total Protein 7.2 6.5 - 8.1 g/dL   Albumin 3.7 3.5 - 5.0 g/dL   AST 520 (H) 15 - 41 U/L   ALT 299 (H) 14 - 54 U/L   Alkaline Phosphatase 119 38 - 126 U/L   Total Bilirubin 0.3 0.3 - 1.2 mg/dL   GFR calc non Af Amer >60 >60 mL/min   GFR calc Af Amer >60 >60 mL/min    Comment: (NOTE) The eGFR has been calculated using the CKD EPI equation. This calculation has not been validated in all clinical situations. eGFR's persistently <60 mL/min signify possible Chronic Kidney Disease.    Anion gap 10 5 - 15  Ethanol      Status: None   Collection Time: 10/27/17  2:18 PM  Result Value Ref Range   Alcohol, Ethyl (B) <10 <10 mg/dL    Comment:        LOWEST DETECTABLE LIMIT FOR SERUM ALCOHOL IS 10 mg/dL FOR MEDICAL PURPOSES ONLY   Salicylate level     Status: None   Collection Time: 10/27/17  2:18 PM  Result Value Ref Range   Salicylate Lvl <7.0 2.8 - 30.0 mg/dL  Acetaminophen level     Status: Abnormal   Collection Time: 10/27/17  2:18 PM  Result Value Ref Range   Acetaminophen (Tylenol), Serum <10 (L) 10 - 30 ug/mL    Comment:        THERAPEUTIC CONCENTRATIONS VARY SIGNIFICANTLY. A RANGE OF 10-30 ug/mL MAY BE AN EFFECTIVE CONCENTRATION FOR MANY PATIENTS. HOWEVER, SOME ARE BEST TREATED AT CONCENTRATIONS OUTSIDE THIS RANGE. ACETAMINOPHEN CONCENTRATIONS >150 ug/mL AT 4 HOURS AFTER INGESTION AND >50 ug/mL AT 12 HOURS AFTER INGESTION ARE OFTEN ASSOCIATED WITH TOXIC REACTIONS.   cbc     Status: Abnormal   Collection Time: 10/27/17  2:18 PM  Result Value Ref Range   WBC 17.9 (H) 4.0 - 10.5 K/uL   RBC 5.52 (H) 3.87 - 5.11 MIL/uL   Hemoglobin 16.0 (H) 12.0 - 15.0 g/dL   HCT 47.6 (H) 36.0 - 46.0 %   MCV 86.2 78.0 - 100.0 fL   MCH 29.0 26.0 - 34.0 pg   MCHC 33.6 30.0 - 36.0 g/dL   RDW 13.9 11.5 - 15.5 %   Platelets 199 150 - 400 K/uL  Rapid urine drug screen (hospital performed)     Status: Abnormal   Collection Time: 10/27/17  2:19 PM  Result Value Ref Range   Opiates NONE DETECTED NONE DETECTED   Cocaine POSITIVE (A) NONE DETECTED   Benzodiazepines NONE DETECTED NONE DETECTED   Amphetamines NONE DETECTED NONE DETECTED   Tetrahydrocannabinol NONE DETECTED NONE DETECTED   Barbiturates NONE DETECTED NONE DETECTED    Comment: (NOTE) DRUG SCREEN FOR MEDICAL PURPOSES ONLY.  IF CONFIRMATION IS NEEDED FOR ANY PURPOSE, NOTIFY LAB WITHIN 5 DAYS. LOWEST DETECTABLE LIMITS FOR URINE DRUG SCREEN Drug Class                     Cutoff (ng/mL) Amphetamine and metabolites    1000 Barbiturate and  metabolites    200 Benzodiazepine                 962 Tricyclics and metabolites     300 Opiates and metabolites        300 Cocaine and metabolites        300 THC  [place] [facility], ED     Status: Abnormal   Collection Time: 10/27/17  2:56 PM  Result Value Ref Range   Sodium 133 (L) 135 - 145 mmol/L   Potassium 4.9 3.5 - 5.1 mmol/L   Chloride 99 (L) 101 - 111 mmol/L   BUN 17 6 - 20 mg/dL   Creatinine, Ser 0.90 0.44 - 1.00 mg/dL   Glucose, Bld 638 (HH) 65 - 99 mg/dL   Calcium, Ion 1.07 (L) 1.15 - 1.40 mmol/L   TCO2 23 22 - 32 mmol/L   Hemoglobin 16.7 (H) 12.0 - 15.0 g/dL   HCT 49.0 (H) 36.0 - 46.0 %   Comment NOTIFIED PHYSICIAN   Blood gas, venous     Status: Abnormal   Collection Time: 10/27/17  2:58 PM  Result Value Ref Range   pH, Ven 7.225 (L) 7.250 - 7.430   pCO2, Ven 55.5 44.0 - 60.0 mmHg   pO2, Ven  32.0 - 45.0 mmHg    CRITICAL RESULT CALLED TO, READ BACK BY AND VERIFIED WITH:    Comment: aSHLEY lASSITER RN BY ROBIN POWELL RRT ON 10/27/17 AT 1503   Bicarbonate 22.1 20.0 - 28.0 mmol/L   Acid-base deficit 5.9 (H) 0.0 - 2.0 mmol/L   O2 Saturation 53.5 %   Patient temperature 98.6    Collection site DRAWN BY RN    Drawn by [phone]    Sample type VENOUS   CBG monitoring, ED     Status: Abnormal   Collection Time: 10/27/17  3:02 PM  Result Value Ref Range   Glucose-Capillary 586 (HH) 65 - 99 mg/dL  Acetaminophen level     Status: Abnormal   Collection Time: 10/27/17  5:30 PM  Result Value Ref Range   Acetaminophen (Tylenol), Serum <10 (L) 10 - 30 ug/mL    Comment:        THERAPEUTIC CONCENTRATIONS VARY SIGNIFICANTLY. A RANGE OF 10-30 ug/mL MAY BE AN EFFECTIVE CONCENTRATION FOR MANY PATIENTS. HOWEVER, SOME ARE BEST TREATED AT CONCENTRATIONS OUTSIDE THIS RANGE. ACETAMINOPHEN CONCENTRATIONS >150 ug/mL AT 4 HOURS AFTER INGESTION AND >50 ug/mL AT 12 HOURS AFTER INGESTION ARE OFTEN ASSOCIATED WITH TOXIC REACTIONS.   CBG monitoring, ED      Status: Abnormal   Collection Time: 10/27/17  5:30 PM  Result Value Ref Range   Glucose-Capillary 395 (H) 65 - 99 mg/dL  CBG monitoring, ED     Status: Abnormal   Collection Time: 10/27/17  6:42 PM  Result Value Ref Range   Glucose-Capillary 329 (H) 65 - 99 mg/dL   Dg Chest Portable 1 View  Result Date: 10/27/2017 CLINICAL DATA:  Altered mental status and recent apparent overdose EXAM: PORTABLE CHEST 1 VIEW COMPARISON:  10/27/2016 FINDINGS: The heart size and mediastinal contours are within normal limits. Aortic calcifications are again seen. Both lungs are clear. The visualized skeletal structures are unremarkable. IMPRESSION: No acute abnormality noted. Electronically Signed   By: Inez Catalina M.D.   On: 10/27/2017 14:33    Pending Labs [employer] (From admission, onward)   Start     Ordered   Signed and Held  HIV antibody (Routine Testing)  Once,   R     Signed and Held   Signed and [employer] metabolic panel  STAT Now then every 4 hours ,   STAT     Signed and Held   Signed and Held  CBC  Tomorrow morning,   R    Question:  Specimen collection method  Answer:  Lab=Lab collect   Signed and Held   Signed and Held  Comprehensive metabolic panel  Tomorrow morning,   R    Question:  Specimen collection method  Answer:  Lab=Lab collect   Signed and Held      Vitals/Pain Today's Vitals   10/27/17 1800 10/27/17 1830 10/27/17 1900 10/27/17 1946  BP: 105/76 92/73 104/67 101/77  Pulse: 79 75 76 73  Resp: _0 SpO2: 94% 94% 96% 96%  Weight:      Height:        Isolation Precautions No active isolations  Medications Medications  dextrose 5 %-0.45 % sodium chloride infusion ( Intravenous Not Given 10/27/17 1604)  insulin regular (NOVOLIN R,HUMULIN R) 100 Units in sodium chloride 0.9 % 100 mL (1 Units/mL) infusion (5.4 Units/hr Intravenous Rate/Dose Change 10/27/17 1904)  sodium chloride 0.9 % bolus 1,000 mL (0 mLs Intravenous Stopped 10/27/17 1703)    And  0.9 %   sodium chloride infusion ( Intravenous New Bag/Given 10/27/17 1603)    Mobility walks

## 2017-10-27 NOTE — ED Notes (Signed)
Bed: RESB Expected date:  Expected time:  Means of arrival:  Comments: EMS-SI

## 2017-10-27 NOTE — H&P (Signed)
History and Physical    Claudia Erickson:[id number] DOB: 08-26-1966 DOA: 10/27/2017  I have briefly reviewed the patient's prior medical records in [facility]  PCP: Julieanne Manson, MD  Patient coming from: home  Chief Complaint: overdose  HPI: Claudia Erickson is a 52 y.o. female with medical history significant of insulin-dependent diabetes mellitus, diabetic neuropathy, hypertension, anxiety and depression, polysubstance abuse, who presents to the hospital with acute encephalopathy in the setting of reported overdose.  She was found at the hotel where she is living by housekeeping, she was unresponsive and had a suicide note.  On my evaluation patient is confused and somnolent, however wakes up enough to tell me that she wanted to kill herself, she initially told me she took some heroin but later changed the story to fentanyl, and also took some Ativan.  She states that these medications were not prescribed but she took them from a friend.  She also has been out of her insulin and could not afford any insulin for the past several months.  ED Course: In the ED she is afebrile, blood pressure is in the 90s, heart rate is normal and she is satting well on room air.  Her blood work is pertinent for mild acidosis with a venous pH of 7.22, CBG was found to be elevated into the 600 range, her bicarbonate is 25 and anion gap is 10.  She was started on insulin infusion and we are asked to admit for hypoglycemia/medication overdose.  Review of Systems: Unable to fully obtain review of systems due to confusion and altered mental status  Past Medical History:  Diagnosis Date  . Anxiety and depression   . Chronic back pain   . Diabetes mellitus without complication ([facility])   . Diabetic neuropathy ([facility]) 06/09/2017  . GERD (gastroesophageal reflux disease)   . Hypertension   . Panic attack 1990s  . Peripheral neuropathy   . Positive TB test   . Substance abuse ([facility])   . Substance induced  mood disorder ([facility])   . UTI (lower urinary tract infection)     Past Surgical History:  Procedure Laterality Date  . ABDOMINAL HYSTERECTOMY    . CHOLECYSTECTOMY    . ROTATOR CUFF REPAIR    . TONSILLECTOMY       reports that she has been smoking cigarettes.  She has a 15.00 pack-year smoking history. she has never used smokeless tobacco. She reports that she uses drugs. Drug: Cocaine. She reports that she does not drink alcohol.  No Known Allergies  Family History  Problem Relation Age of Onset  . Diabetes Mellitus II Father   . Hyperlipidemia Father   . Diabetes Father   . Colon cancer Father 36       not sure of age he was diagnosed  . Arthritis Mother   . Hypertension Mother   . Heart disease Mother   . Drug abuse Son        incarcerated with drug issues  . CAD Neg Hx     Prior to Admission medications   Medication Sig Start Date End Date Taking? Authorizing Provider  citalopram (CELEXA) 40 MG tablet Take 1 tablet (40 mg total) by mouth daily. For depression 04/26/17   Julieanne Manson, MD  gabapentin (NEURONTIN) 400 MG capsule Take 1 capsule (400 mg total) by mouth 3 (three) times daily. Increase by 1 every 3 days as tolerated until taking 2 three times a day.For agitation/Neuropathic pain 08/31/17   Mulberry,  Lanora Manis, MD  hydrochlorothiazide (HYDRODIURIL) 25 MG tablet Take 1 tablet (25 mg total) by mouth daily. For high blood pressure 03/23/17   Nwoko, Nicole Kindred I, NP  insulin aspart protamine- aspart (NOVOLOG MIX 70/30) (70-30) 100 UNIT/ML injection Inject 40 units into the skin with breakfast & 35 units in to the Skin at supper: For diabetes management Patient taking differently: Inject 40 units into the skin with breakfast & 30 units in to the Skin at supper: For diabetes management 03/22/17   Armandina Stammer I, NP  LORazepam (ATIVAN) 1 MG tablet 1/2-1 tablet by mouth as needed 04/13/17   Julieanne Manson, MD  metFORMIN (GLUCOPHAGE) 850 MG tablet Take 1 tablet (850 mg  total) by mouth 2 (two) times daily with a meal. For diabetes management 03/22/17   Armandina Stammer I, NP  mirtazapine (REMERON) 15 MG tablet Take 1 tablet (15 mg total) by mouth at bedtime. For depression/sleep 04/26/17   Julieanne Manson, MD  nicotine (NICODERM CQ - DOSED IN MG/24 HOURS) 21 mg/24hr patch Place 1 patch (21 mg total) onto the skin daily. For smoking cessation Patient not taking: Reported on 04/13/2017 03/23/17   Armandina Stammer I, NP  omeprazole (PRILOSEC OTC) 20 MG tablet Take 1 tablet (20 mg total) by mouth daily. For acid reflux 03/22/17   Armandina Stammer I, NP    Physical Exam: Vitals:   10/27/17 1345 10/27/17 1400 10/27/17 1411  BP:  109/89   Pulse:  86   Resp:  15   SpO2: (!) 87% 93%   Weight:   131.5 kg (290 lb)  Height:   5\' 9"  (1.753 m)      Constitutional: Appears drowsy Eyes: PERRL, lids and conjunctivae normal ENMT: Mucous membranes are dry. Posterior pharynx clear of any exudate or lesions. Neck: normal, supple Respiratory: clear to auscultation bilaterally, no wheezing, no crackles. Normal respiratory effort. No accessory muscle use.  Cardiovascular: Regular rate and rhythm, no murmurs / rubs / gallops. No extremity edema. 2+ pedal pulses.  Abdomen: no tenderness, no masses palpated. Bowel sounds positive.  Musculoskeletal: no clubbing / cyanosis. Normal muscle tone.  Skin: no rashes Neurologic: Nonfocal, somewhat sluggish and following commands Psychiatric:  Alert to person  Labs on Admission: I have personally reviewed following labs and imaging studies  CBC: Recent Labs  Lab 10/27/17 1418 10/27/17 1456  WBC 17.9*  --   HGB 16.0* 16.7*  HCT 47.6* 49.0*  MCV 86.2  --   PLT 199  --    Basic Metabolic Panel: Recent Labs  Lab 10/27/17 1418 10/27/17 1456  NA 134* 133*  K 4.7 4.9  CL 99* 99*  CO2 25  --   GLUCOSE 610* 638*  BUN 16 17  CREATININE 1.04* 0.90  CALCIUM 8.9  --    GFR: Estimated Creatinine Clearance: 106.5 mL/min (by C-G formula  based on SCr of 0.9 mg/dL). Liver Function Tests: Recent Labs  Lab 10/27/17 1418  AST 520*  ALT 299*  ALKPHOS 119  BILITOT 0.3  PROT 7.2  ALBUMIN 3.7   No results for input(s): LIPASE, AMYLASE in the last 168 hours. No results for input(s): AMMONIA in the last 168 hours. Coagulation Profile: No results for input(s): INR, PROTIME in the last 168 hours. Cardiac Enzymes: No results for input(s): CKTOTAL, CKMB, CKMBINDEX, TROPONINI in the last 168 hours. BNP (last 3 results) No results for input(s): PROBNP in the last 8760 hours. HbA1C: No results for input(s): HGBA1C in the last 72 hours. CBG: Recent Labs  Lab 10/27/17 1502  GLUCAP 586*   Lipid Profile: No results for input(s): CHOL, HDL, LDLCALC, TRIG, CHOLHDL, LDLDIRECT in the last 72 hours. Thyroid Function Tests: No results for input(s): TSH, T4TOTAL, FREET4, T3FREE, THYROIDAB in the last 72 hours. Anemia Panel: No results for input(s): VITAMINB12, FOLATE, FERRITIN, TIBC, IRON, RETICCTPCT in the last 72 hours. Urine analysis:    Component Value Date/Time   COLORURINE YELLOW 07/27/2014 1749   APPEARANCEUR CLOUDY (A) 07/27/2014 1749   LABSPEC 1.039 (H) 07/27/2014 1749   PHURINE 6.0 07/27/2014 1749   GLUCOSEU >1000 (A) 07/27/2014 1749   HGBUR SMALL (A) 07/27/2014 1749   BILIRUBINUR negative 04/08/2015 1117   KETONESUR NEGATIVE 07/27/2014 1749   PROTEINUR smal 1+ 04/08/2015 1117   PROTEINUR 30 (A) 07/27/2014 1749   UROBILINOGEN negative 04/08/2015 1117   UROBILINOGEN 0.2 07/27/2014 1749   NITRITE negative 04/08/2015 1117   NITRITE NEGATIVE 07/27/2014 1749   LEUKOCYTESUR moderate (2+) (A) 04/08/2015 1117     Radiological Exams on Admission: Dg Chest Portable 1 View  Result Date: 10/27/2017 CLINICAL DATA:  Altered mental status and recent apparent overdose EXAM: PORTABLE CHEST 1 VIEW COMPARISON:  10/27/2016 FINDINGS: The heart size and mediastinal contours are within normal limits. Aortic calcifications are again  seen. Both lungs are clear. The visualized skeletal structures are unremarkable. IMPRESSION: No acute abnormality noted. Electronically Signed   By: Alcide CleverMark  Lukens M.D.   On: 10/27/2017 14:33    EKG: Independently reviewed. Sinus rhythm  Assessment/Plan Active Problems:   Polysubstance abuse (HCC)   Diabetes mellitus (HCC)   Anxiety and depression   Hyperglycemia   Intentional drug overdose (HCC)   Suicidal ideation   Diabetic neuropathy ([facility]CC)   Overdose   Intentional overdose -Patient admitted to me that she did intend to kill herself,  -in the ED she is drowsy but appears able to protect her airway, will admit to stepdown for closer observation -Psychiatry consulted, appreciate input -Patient will not be able to leave the hospital and will need to be IVC if she wants to leave AMA  Hyperglycemia -Patient with type 2 diabetes mellitus, off of her insulin for several weeks due to financial constraints, her bicarb is normal and she does not have an anion gap, admit for an insulin infusion.  We will keep patient n.p.o. as she is quite drowsy and I do not think she will be safe to eat anything at this point  Elevated LFTs -We will monitor, unclear how much she ingested and what, Tylenol level was less than 10 at 2 PM, will repeat another level in 6 hours -Closely monitor  Diabetic neuropathy -Hold gabapentin as it can further depress mental status  Depression/anxiety -Psychiatry to see, for now hold her Celexa, hold Ativan  Polysubstance abuse -We will need social worker consultation     DVT prophylaxis: Lovenox  Code Status: Full code  Family Communication: no family at bedside Disposition Plan: admit to SDU Consults called: Psychiatry via epic    Admission status:  Inpatient   Pamella Pertostin Danijah Noh, MD [facility] Pager [phone]  If 7PM-7AM, please contact night-coverage [email or web address] Password [email or web address]  10/27/2017, 3:57 PM

## 2017-10-27 NOTE — ED Provider Notes (Signed)
[facility]-EMERGENCY DEPT Provider Note   CSN: [id number] Arrival date & time: 10/27/17  1323     History   Chief Complaint Chief Complaint  Patient presents with  . heroin OD  . Suicidal    HPI Claudia Erickson is a 52 y.o. female. Level 5 caveat due to altered mental status HPI Patient presents after an overdose.  Reportedly found at hotel by housekeeping.  Found unresponsive with a suicide note.  Patient states that she attempted overdose on fentanyl.  States it was a suicide attempt.  Still a little confused and somewhat uncooperative.  Has been off her diabetes medicines.  Has been off for a couple months due to financial issues. Past Medical History:  Diagnosis Date  . Anxiety and depression   . Chronic back pain   . Diabetes mellitus without complication (HCC)   . Diabetic neuropathy (HCC) 06/09/2017  . GERD (gastroesophageal reflux disease)   . Hypertension   . Panic attack 1990s  . Peripheral neuropathy   . Positive TB test   . Substance abuse (HCC)   . Substance induced mood disorder (HCC)   . UTI (lower urinary tract infection)     Patient Active Problem List   Diagnosis Date Noted  . Diabetic neuropathy (HCC) 06/09/2017  . Cocaine abuse (HCC) 03/16/2017  . Tobacco abuse 02/02/2017  . Panic attack   . Chronic venous insufficiency 10/28/2016  . Hyponatremia 10/28/2016  . Hyperglycemia   . Intentional drug overdose (HCC)   . Suicidal ideation   . Urinary frequency 04/08/2015  . Anxiety and depression 12/26/2014  . Insomnia 12/26/2014  . 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 Aspiration into airway 12/06/2013  . Aspiration pneumonia (HCC) 12/06/2013  . Polysubstance abuse (HCC) 12/06/2013  . Diabetes mellitus (HCC) 12/06/2013  . Syncope 12/06/2013  . Leucocytosis 12/06/2013  . Acute respiratory failure (HCC) 12/06/2013  . Elevated LFTs 12/06/2013    Past Surgical History:    Procedure Laterality Date  . ABDOMINAL HYSTERECTOMY    . CHOLECYSTECTOMY    . ROTATOR CUFF REPAIR    . TONSILLECTOMY      OB History    No data available       Home Medications    Prior to Admission medications   Medication Sig Start Date End Date Taking? Authorizing Provider  citalopram (CELEXA) 40 MG tablet Take 1 tablet (40 mg total) by mouth daily. For depression 04/26/17   Julieanne MansonMulberry, Elizabeth, MD  gabapentin (NEURONTIN) 400 MG capsule Take 1 capsule (400 mg total) by mouth 3 (three) times daily. Increase by 1 every 3 days as tolerated until taking 2 three times a day.For agitation/Neuropathic pain 08/31/17   Julieanne MansonMulberry, Elizabeth, MD  hydrochlorothiazide (HYDRODIURIL) 25 MG tablet Take 1 tablet (25 mg total) by mouth daily. For high blood pressure 03/23/17   Nwoko, Nicole KindredAgnes I, NP  insulin aspart protamine- aspart (NOVOLOG MIX 70/30) (70-30) 100 UNIT/ML injection Inject 40 units into the skin with breakfast & 35 units in to the Skin at supper: For diabetes management Patient taking differently: Inject 40 units into the skin with breakfast & 30 units in to the Skin at supper: For diabetes management 03/22/17   Armandina StammerNwoko, Agnes I, NP  LORazepam (ATIVAN) 1 MG tablet 1/2-1 tablet by mouth as needed 04/13/17   Julieanne MansonMulberry, Elizabeth, MD  metFORMIN (GLUCOPHAGE) 850 MG tablet Take 1 tablet (850 mg total) by mouth 2 (two) times daily with a meal. For diabetes  management 03/22/17   Armandina Stammer I, NP  mirtazapine (REMERON) 15 MG tablet Take 1 tablet (15 mg total) by mouth at bedtime. For depression/sleep 04/26/17   Julieanne Manson, MD  nicotine (NICODERM CQ - DOSED IN MG/24 HOURS) 21 mg/24hr patch Place 1 patch (21 mg total) onto the skin daily. For smoking cessation Patient not taking: Reported on 04/13/2017 03/23/17   Armandina Stammer I, NP  omeprazole (PRILOSEC OTC) 20 MG tablet Take 1 tablet (20 mg total) by mouth daily. For acid reflux 03/22/17   Sanjuana Kava, NP    Family History Family History   Problem Relation Age of Onset  . Diabetes Mellitus II Father   . Hyperlipidemia Father   . Diabetes Father   . Colon cancer Father 57       not sure of age he was diagnosed  . Arthritis Mother   . Hypertension Mother   . Heart disease Mother   . Drug abuse Son        incarcerated with drug issues  . CAD Neg Hx     Social History Social History   Tobacco Use  . Smoking status: Current Every Day Smoker    Packs/day: 0.50    Years: 30.00    Pack years: 15.00    Types: Cigarettes  . Smokeless tobacco: Never Used  Substance Use Topics  . Alcohol use: No  . Drug use: Yes    Types: Cocaine    Comment: Heroine--only used briefly before.     Allergies   Patient has no known allergies.   Review of Systems Review of Systems  Unable to perform ROS: Psychiatric disorder     Physical Exam Updated Vital Signs BP 109/89   Pulse 86   Resp 15   Ht 5\' 9"  (1.753 m)   Wt 131.5 kg (290 lb)   SpO2 93%   BMI 42.83 kg/m   Physical Exam  Constitutional: She appears well-developed.  HENT:  Head: Atraumatic.  Eyes: Pupils are equal, round, and reactive to light.  Neck: Neck supple.  Cardiovascular: Normal rate.  Pulmonary/Chest: No respiratory distress.  Abdominal: There is no tenderness.  Musculoskeletal: She exhibits no tenderness.  Neurological:  Patient is awake and able answer questions.  Still seems somewhat confused.  Skin: Skin is warm. Capillary refill takes less than 2 seconds.  Psychiatric:  Patient appears somewhat depressed     ED Treatments / Results  Labs (all labs ordered are listed, but only abnormal results are displayed) Labs Reviewed  CBC - Abnormal; Notable for the following components:      Result Value   WBC 17.9 (*)    RBC 5.52 (*)    Hemoglobin 16.0 (*)    HCT 47.6 (*)    All other components within normal limits  RAPID URINE DRUG SCREEN, HOSP PERFORMED - Abnormal; Notable for the following components:   Cocaine POSITIVE (*)    All  other components within normal limits  BLOOD GAS, VENOUS - Abnormal; Notable for the following components:   pH, Ven 7.225 (*)    Acid-base deficit 5.9 (*)    All other components within normal limits  CBG MONITORING, ED - Abnormal; Notable for the following components:   Glucose-Capillary 586 (*)    All other components within normal limits  I-STAT CHEM 8, ED - Abnormal; Notable for the following components:   Sodium 133 (*)    Chloride 99 (*)    Glucose, Bld 638 (*)  Calcium, Ion 1.07 (*)    Hemoglobin 16.7 (*)    HCT 49.0 (*)    All other components within normal limits  COMPREHENSIVE METABOLIC PANEL  ETHANOL  SALICYLATE LEVEL  ACETAMINOPHEN LEVEL    EKG  EKG Interpretation  Date/Time:  Thursday October 27 2017 14:23:50 EST Ventricular Rate:  79 PR Interval:    QRS Duration: 105 QT Interval:  410 QTC Calculation: 470 R Axis:   79 Text Interpretation:  Sinus rhythm Low voltage, precordial leads Confirmed by Benjiman Core [phone]) on 10/27/2017 3:30:30 PM       Radiology Dg Chest Portable 1 View  Result Date: 10/27/2017 CLINICAL DATA:  Altered mental status and recent apparent overdose EXAM: PORTABLE CHEST 1 VIEW COMPARISON:  10/27/2016 FINDINGS: The heart size and mediastinal contours are within normal limits. Aortic calcifications are again seen. Both lungs are clear. The visualized skeletal structures are unremarkable. IMPRESSION: No acute abnormality noted. Electronically Signed   By: Alcide Clever M.D.   On: 10/27/2017 14:33    Procedures Procedures (including critical care time)  Medications Ordered in ED Medications  dextrose 5 %-0.45 % sodium chloride infusion (not administered)  insulin regular (NOVOLIN R,HUMULIN R) 100 Units in sodium chloride 0.9 % 100 mL (1 Units/mL) infusion (not administered)  sodium chloride 0.9 % bolus 1,000 mL (not administered)    And  0.9 %  sodium chloride infusion (not administered)     Initial Impression / Assessment  and Plan / ED Course  I have reviewed the triage vital signs and the nursing notes.  Pertinent labs & imaging results that were available during my care of the patient were reviewed by me and considered in my medical decision making (see chart for details).     Patient with reported fentanyl overdose in a suicide attempt.  Patient had told the nurse however that she also took some Ativan at 4 in the morning.  Also has been off her insulin.  Sugar of 600 with mild acidosis on venous blood gas.  Will awake and converse but still somewhat sedate.  At this point with the DKA and overdose patient will require admission.  Will admit to hospitalist.  CRITICAL CARE Performed by: Benjiman Core Total critical care time: 30 minutes Critical care time was exclusive of separately billable procedures and treating other patients. Critical care was necessary to treat or prevent imminent or life-threatening deterioration. Critical care was time spent personally by me on the following activities: development of treatment plan with patient and/or surrogate as well as nursing, discussions with consultants, evaluation of patient's response to treatment, examination of patient, obtaining history from patient or surrogate, ordering and performing treatments and interventions, ordering and review of laboratory studies, ordering and review of radiographic studies, pulse oximetry and re-evaluation of patient's condition.  Final Clinical Impressions(s) / ED Diagnoses   Final diagnoses:  Polysubstance abuse (HCC)  Suicide attempt [facility])  Intentional opiate overdose, initial encounter [facility])  Diabetic ketoacidosis without coma associated with type 2 diabetes mellitus [facility])    ED Discharge Orders    None       Benjiman Core, MD 10/27/17 1531

## 2017-10-27 NOTE — ED Notes (Signed)
ATTEMPTED BLOOD DRAW NOT SLUCCESSFUL. MACON RN AT BEDSIDE TO ASSIST.

## 2017-10-27 NOTE — ED Notes (Signed)
ED Provider at bedside. PICKERING 

## 2017-10-27 NOTE — ED Triage Notes (Addendum)
Per EMS-states she was found by hotel housekeeping in bathroom slumped over-powdered substance on mouth and nose-needles and white powder substance found in her bed-states a suicidal note was found as well-noncompliant with DM meds-not able to afford insulin-CBG reading high-1 mg of Narcan given-patient was incontinent of bowel-responsive to verbal stimulation

## 2017-10-28 ENCOUNTER — Inpatient Hospital Stay (HOSPITAL_COMMUNITY): Payer: Self-pay

## 2017-10-28 DIAGNOSIS — R450 Nervousness: Secondary | ICD-10-CM

## 2017-10-28 DIAGNOSIS — T424X2A Poisoning by benzodiazepines, intentional self-harm, initial encounter: Secondary | ICD-10-CM

## 2017-10-28 DIAGNOSIS — F17210 Nicotine dependence, cigarettes, uncomplicated: Secondary | ICD-10-CM

## 2017-10-28 DIAGNOSIS — T50902D Poisoning by unspecified drugs, medicaments and biological substances, intentional self-harm, subsequent encounter: Secondary | ICD-10-CM

## 2017-10-28 DIAGNOSIS — Z915 Personal history of self-harm: Secondary | ICD-10-CM

## 2017-10-28 DIAGNOSIS — E1142 Type 2 diabetes mellitus with diabetic polyneuropathy: Secondary | ICD-10-CM

## 2017-10-28 DIAGNOSIS — Z813 Family history of other psychoactive substance abuse and dependence: Secondary | ICD-10-CM

## 2017-10-28 DIAGNOSIS — G4700 Insomnia, unspecified: Secondary | ICD-10-CM

## 2017-10-28 LAB — COMPREHENSIVE METABOLIC PANEL
ALK PHOS: 87 U/L (ref 38–126)
ALT: 194 U/L — AB (ref 14–54)
AST: 125 U/L — AB (ref 15–41)
Albumin: 3 g/dL — ABNORMAL LOW (ref 3.5–5.0)
Anion gap: 8 (ref 5–15)
BUN: 14 mg/dL (ref 6–20)
CALCIUM: 8.7 mg/dL — AB (ref 8.9–10.3)
CO2: 26 mmol/L (ref 22–32)
CREATININE: 0.67 mg/dL (ref 0.44–1.00)
Chloride: 104 mmol/L (ref 101–111)
Glucose, Bld: 135 mg/dL — ABNORMAL HIGH (ref 65–99)
Potassium: 3.5 mmol/L (ref 3.5–5.1)
Sodium: 138 mmol/L (ref 135–145)
Total Bilirubin: 0.4 mg/dL (ref 0.3–1.2)
Total Protein: 6 g/dL — ABNORMAL LOW (ref 6.5–8.1)

## 2017-10-28 LAB — MRSA PCR SCREENING: MRSA BY PCR: POSITIVE — AB

## 2017-10-28 LAB — GLUCOSE, CAPILLARY
GLUCOSE-CAPILLARY: 135 mg/dL — AB (ref 65–99)
Glucose-Capillary: 154 mg/dL — ABNORMAL HIGH (ref 65–99)
Glucose-Capillary: 156 mg/dL — ABNORMAL HIGH (ref 65–99)
Glucose-Capillary: 161 mg/dL — ABNORMAL HIGH (ref 65–99)
Glucose-Capillary: 220 mg/dL — ABNORMAL HIGH (ref 65–99)
Glucose-Capillary: 249 mg/dL — ABNORMAL HIGH (ref 65–99)
Glucose-Capillary: 285 mg/dL — ABNORMAL HIGH (ref 65–99)
Glucose-Capillary: 290 mg/dL — ABNORMAL HIGH (ref 65–99)

## 2017-10-28 LAB — BASIC METABOLIC PANEL
ANION GAP: 8 (ref 5–15)
BUN: 15 mg/dL (ref 6–20)
CHLORIDE: 105 mmol/L (ref 101–111)
CO2: 24 mmol/L (ref 22–32)
Calcium: 8.5 mg/dL — ABNORMAL LOW (ref 8.9–10.3)
Creatinine, Ser: 0.65 mg/dL (ref 0.44–1.00)
GFR calc Af Amer: >60 mL/min (ref >60)
GLUCOSE: 183 mg/dL — AB (ref 65–99)
POTASSIUM: 3.4 mmol/L — AB (ref 3.5–5.1)
Sodium: 137 mmol/L (ref 135–145)

## 2017-10-28 LAB — CBC
HEMATOCRIT: 40.9 % (ref 36.0–46.0)
Hemoglobin: 13.5 g/dL (ref 12.0–15.0)
MCH: 28.4 pg (ref 26.0–34.0)
MCHC: 33 g/dL (ref 30.0–36.0)
MCV: 85.9 fL (ref 78.0–100.0)
PLATELETS: 184 10*3/uL (ref 150–400)
RBC: 4.76 MIL/uL (ref 3.87–5.11)
RDW: 14.1 % (ref 11.5–15.5)
WBC: 12.5 10*3/uL — ABNORMAL HIGH (ref 4.0–10.5)

## 2017-10-28 LAB — HIV ANTIBODY (ROUTINE TESTING W REFLEX): HIV Screen 4th Generation wRfx: NONREACTIVE

## 2017-10-28 MED ORDER — INSULIN GLARGINE 100 UNIT/ML ~~LOC~~ SOLN
15.0000 [IU] | Freq: Every day | SUBCUTANEOUS | Status: DC
  Administered 2017-10-28: 15 [IU] via SUBCUTANEOUS
  Filled 2017-10-28 (×2): qty 0.15

## 2017-10-28 MED ORDER — POTASSIUM CHLORIDE CRYS ER 20 MEQ PO TBCR
40.0000 meq | EXTENDED_RELEASE_TABLET | Freq: Once | ORAL | Status: AC
  Administered 2017-10-28: 40 meq via ORAL
  Filled 2017-10-28: qty 2

## 2017-10-28 MED ORDER — PHENOL 1.4 % MT LIQD
1.0000 | OROMUCOSAL | Status: DC | PRN
  Filled 2017-10-28: qty 177

## 2017-10-28 MED ORDER — MIRTAZAPINE 15 MG PO TABS
15.0000 mg | ORAL_TABLET | Freq: Every day | ORAL | Status: DC
  Administered 2017-10-28: 15 mg via ORAL
  Filled 2017-10-28: qty 1

## 2017-10-28 MED ORDER — MUPIROCIN 2 % EX OINT
1.0000 "application " | TOPICAL_OINTMENT | Freq: Two times a day (BID) | CUTANEOUS | Status: DC
  Administered 2017-10-28 – 2017-10-29 (×3): 1 via NASAL
  Filled 2017-10-28: qty 22

## 2017-10-28 MED ORDER — CHLORHEXIDINE GLUCONATE CLOTH 2 % EX PADS
6.0000 | MEDICATED_PAD | Freq: Every day | CUTANEOUS | Status: DC
  Administered 2017-10-29: 6 via TOPICAL

## 2017-10-28 NOTE — Progress Notes (Signed)
   10/28/17 1500  Clinical Encounter Type  Visited With Patient not available  Visit Type Initial;Psychological support;Spiritual support;Critical Care  Referral From Nurse  Consult/Referral To Chaplain  Spiritual Encounters  Spiritual Needs Emotional;Other (Comment) (Spiritual Care conversation/Support)  Stress Factors  Patient Stress Factors Not reviewed   Stopped by to visit with the patient per Spiritual Care consult. Patient was not available.   Please, contact Spiritual Care for further assistance.  Will follow up at later time.   Chaplain Clint BolderBrittany Lennix Rotundo M.Div., [facility]

## 2017-10-28 NOTE — Progress Notes (Signed)
Patient transferred from ICU. Vital signs stable. Alert and oriented but drowsy. Denies any needs at this time. Will continue to monitor the patient. Sitter at bedside.

## 2017-10-28 NOTE — Progress Notes (Signed)
Attempted to titrate O2 off. Patient desaturated to 85% on room air. Turned O2 back up to 1L [place] and patient maintained O2 of 89%. On 2L  patient has stable O2 of 94%. Will attempt to titrate as patient becomes more alert.

## 2017-10-28 NOTE — Consult Note (Signed)
[facility] Face-to-Face Psychiatry Consult   Reason for Consult:  Suicide attempt by  Referring Physician:  Dr. Cruzita Lederer Patient Identification: Claudia Erickson MRN:  [id number] Principal Diagnosis: Severe episode of recurrent major depressive disorder, without psychotic features [facility]) Diagnosis:   Patient Active Problem List   Diagnosis Date Noted  . Overdose [T50.901A] 10/27/2017  . Diabetic neuropathy ([place]) [E11.40] 06/09/2017  . Cocaine abuse ([place]) [F14.10] 03/16/2017  . Tobacco abuse [Z72.0] 02/02/2017  . Panic attack [F41.0]   . Chronic venous insufficiency [I87.2] 10/28/2016  . Hyponatremia [E87.1] 10/28/2016  . Hyperglycemia [R73.9]   . Intentional drug overdose ([place]) [T50.902A]   . Suicidal ideation [R45.851]   . Urinary frequency [R35.0] 04/08/2015  . Anxiety and depression [F41.9, F32.9] 12/26/2014  . Insomnia [G47.00] 12/26/2014  . Major depressive disorder, recurrent severe without psychotic features ([place]) [F33.2] 08/27/2014  . Aspiration into airway [T17.908A] 12/06/2013  . Aspiration pneumonia ([place]) [J69.0] 12/06/2013  . Polysubstance abuse ([place]) [F19.10] 12/06/2013  . Diabetes mellitus ([place]) [E11.9] 12/06/2013  . Syncope [R55] 12/06/2013  . Leucocytosis [D72.829] 12/06/2013  . Acute respiratory failure ([place]) [J96.00] 12/06/2013  . Elevated LFTs [R94.5] 12/06/2013    Total Time spent with patient: 1 hour  Subjective:   Claudia Erickson is a 52 y.o. female patient admitted with altered mental status due to fentanyl and Ativan overdose.  HPI:   Per chart review, patient has a history of anxiety, depression and polysubstance abuse. She presented to the hospital with acute encephalopathy in the setting of overdose with Fentanyl and Ativan. She previously reported heroin overdose but later changed it to Fentanyl. She reports taking her friend's medications. She has been out of her insulin for several months because she cannot afford it. Glucose was 610 on admission, AST of 125  and ALT of 194. UDS was positive for cocaine and alcohol level was < 10.   Of note, she was last admitted to [facility] for Trazodone overdose. She was discharged on Remeron 15 mg qhs, Celexa 40 mg daily, Gabapentin 400 mg TID and Ativan 0.5 mg q 6 hours PRN.   On interview, [name] reports that she overdosed on Fentanyl and Klonopin because she no longer wants to be here. She got these substances from the street. She reports that she was caring for her mother for 10 years until she passed away. She now cares for her father. He has Alzheimer's disease. She reports that she has felt overwhelmed. She becomes frustrated with his forgetfulness. He kicked her out of his home prior to admission which precipitated her overdose. She reports chronic SI prior to her suicide attempt. She reports current SI. She denies HI or AVH. She reports anxiety. It makes it hard for her to get out of bed. She reports fluctuations in her sleep and denies problems with her appetite.   Past Psychiatric History: MDD, polysubstance abuse and history of multiple suicide attempts.   Risk to Self: Is patient at risk for suicide?: Yes Risk to Others:  None. Denies HI.  Prior Inpatient Therapy:  She was admitted to [facility] in 11/2016 and 03/2017 for suicide attempt by overdose with Trazodone. Prior Outpatient Therapy:  She was previously seeing an outpatient provider but was unable to afford her medications and stopped them.   Past Medical History:  Past Medical History:  Diagnosis Date  . Anxiety and depression   . Chronic back pain   . Diabetes mellitus without complication ([place])   . Diabetic neuropathy (Claudia Erickson) 06/09/2017  .  GERD (gastroesophageal reflux disease)   . Hypertension   . Panic attack 1990s  . Peripheral neuropathy   . Positive TB test   . Substance abuse ([place])   . Substance induced mood disorder ([place])   . UTI (lower urinary tract infection)     Past Surgical History:  Procedure Laterality Date  . ABDOMINAL HYSTERECTOMY     . CHOLECYSTECTOMY    . ROTATOR CUFF REPAIR    . TONSILLECTOMY     Family History:  Family History  Problem Relation Age of Onset  . Diabetes Mellitus II Father   . Hyperlipidemia Father   . Diabetes Father   . Colon cancer Father 31       not sure of age he was diagnosed  . Arthritis Mother   . Hypertension Mother   . Heart disease Mother   . Drug abuse Son        incarcerated with drug issues  . CAD Neg Hx    Family Psychiatric  History: Son-substance abuse.  Social History:  Social History   Substance and Sexual Activity  Alcohol Use No     Social History   Substance and Sexual Activity  Drug Use Yes  . Types: Cocaine   Comment: Heroine--only used briefly before.    Social History   Socioeconomic History  . Marital status: Single    Spouse name: None  . Number of children: 2  . Years of education: 8  . Highest education level: None  Social Needs  . Financial resource strain: None  . Food insecurity - worry: None  . Food insecurity - inability: None  . Transportation needs - medical: None  . Transportation needs - non-medical: None  Occupational History  . 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 Smoking status: Current Every Day Smoker    Packs/day: 0.50    Years: 30.00    Pack years: 15.00    Types: Cigarettes  . Smokeless tobacco: Never Used  Substance and Sexual Activity  . Alcohol use: No  . Drug use: Yes    Types: Cocaine    Comment: Heroine--only used briefly before.  Marland Kitchen Sexual activity: No  Other Topics Concern  . None  Social History Narrative   Born and raised in [place], [place].   Fun: Doesn't do a thing - never leaves the house.   Denies religious beliefs affecting health care.    Recently moved in with father for caregiver purposes   Not in touch with older son.   Younger son incarcerated for drug related reasons.   Additional Social History: She is single. She has two sons (74  and 43 y/o). She does not have a good relationship with them. She was previously living with her father and caring for him. She is recently homeless since her father will not allow her to live with him due to her substance use. She reports cocaine use. She denies alcohol use. She smokes 0.5 ppd.     Allergies:  No Known Allergies  Labs:  Results for orders placed or performed during the hospital encounter of 10/27/17 (from the past 48 hour(s))  Comprehensive metabolic panel     Status: Abnormal   Collection Time: 10/27/17  2:18 PM  Result Value Ref Range   Sodium 134 (L) 135 - 145 mmol/L   Potassium 4.7 3.5 - 5.1 mmol/L   Chloride 99 (L) 101 - 111 mmol/L   CO2 25  22 - 32 mmol/L   Glucose, Bld 610 (HH) 65 - 99 mg/dL    Comment: CRITICAL RESULT CALLED TO, READ BACK BY AND VERIFIED WITH: MILNER,Q. RN _0  ON 01.24.19 BY COHEN,K    BUN 16 6 - 20 mg/dL   Creatinine, Ser 1.04 (H) 0.44 - 1.00 mg/dL   Calcium 8.9 8.9 - 10.3 mg/dL   Total Protein 7.2 6.5 - 8.1 g/dL   Albumin 3.7 3.5 - 5.0 g/dL   AST 520 (H) 15 - 41 U/L   ALT 299 (H) 14 - 54 U/L   Alkaline Phosphatase 119 38 - 126 U/L   Total Bilirubin 0.3 0.3 - 1.2 mg/dL   GFR calc non Af Amer >60 >60 mL/min   GFR calc Af Amer >60 >60 mL/min    Comment: (NOTE) The eGFR has been calculated using the CKD EPI equation. This calculation has not been validated in all clinical situations. eGFR's persistently <60 mL/min signify possible Chronic Kidney Disease.    Anion gap 10 5 - 15  Ethanol     Status: None   Collection Time: 10/27/17  2:18 PM  Result Value Ref Range   Alcohol, Ethyl (B) <10 <10 mg/dL    Comment:        LOWEST DETECTABLE LIMIT FOR SERUM ALCOHOL IS 10 mg/dL FOR MEDICAL PURPOSES ONLY   Salicylate level     Status: None   Collection Time: 10/27/17  2:18 PM  Result Value Ref Range   Salicylate Lvl <1.6 2.8 - 30.0 mg/dL  Acetaminophen level     Status: Abnormal   Collection Time: 10/27/17  2:18 PM  Result Value Ref  Range   Acetaminophen (Tylenol), Serum <10 (L) 10 - 30 ug/mL    Comment:        THERAPEUTIC CONCENTRATIONS VARY SIGNIFICANTLY. A RANGE OF 10-30 ug/mL MAY BE AN EFFECTIVE CONCENTRATION FOR MANY PATIENTS. HOWEVER, SOME ARE BEST TREATED AT CONCENTRATIONS OUTSIDE THIS RANGE. ACETAMINOPHEN CONCENTRATIONS >150 ug/mL AT 4 HOURS AFTER INGESTION AND >50 ug/mL AT 12 HOURS AFTER INGESTION ARE OFTEN ASSOCIATED WITH TOXIC REACTIONS.   cbc     Status: Abnormal   Collection Time: 10/27/17  2:18 PM  Result Value Ref Range   WBC 17.9 (H) 4.0 - 10.5 K/uL   RBC 5.52 (H) 3.87 - 5.11 MIL/uL   Hemoglobin 16.0 (H) 12.0 - 15.0 g/dL   HCT 47.6 (H) 36.0 - 46.0 %   MCV 86.2 78.0 - 100.0 fL   MCH 29.0 26.0 - 34.0 pg   MCHC 33.6 30.0 - 36.0 g/dL   RDW 13.9 11.5 - 15.5 %   Platelets 199 150 - 400 K/uL  Rapid urine drug screen (hospital performed)     Status: Abnormal   Collection Time: 10/27/17  2:19 PM  Result Value Ref Range   Opiates NONE DETECTED NONE DETECTED   Cocaine POSITIVE (A) NONE DETECTED   Benzodiazepines NONE DETECTED NONE DETECTED   Amphetamines NONE DETECTED NONE DETECTED   Tetrahydrocannabinol NONE DETECTED NONE DETECTED   Barbiturates NONE DETECTED NONE DETECTED    Comment: (NOTE) DRUG SCREEN FOR MEDICAL PURPOSES ONLY.  IF CONFIRMATION IS NEEDED FOR ANY PURPOSE, NOTIFY LAB WITHIN 5 DAYS. LOWEST DETECTABLE LIMITS FOR URINE DRUG SCREEN Drug Class                     Cutoff (ng/mL) Amphetamine and metabolites    1000 Barbiturate and metabolites    200 Benzodiazepine  193 Tricyclics and metabolites     300 Opiates and metabolites        300 Cocaine and metabolites        300 THC                            50   I-stat Chem 8, ED     Status: Abnormal   Collection Time: 10/27/17  2:56 PM  Result Value Ref Range   Sodium 133 (L) 135 - 145 mmol/L   Potassium 4.9 3.5 - 5.1 mmol/L   Chloride 99 (L) 101 - 111 mmol/L   BUN 17 6 - 20 mg/dL   Creatinine, Ser 0.90 0.44  - 1.00 mg/dL   Glucose, Bld 638 (HH) 65 - 99 mg/dL   Calcium, Ion 1.07 (L) 1.15 - 1.40 mmol/L   TCO2 23 22 - 32 mmol/L   Hemoglobin 16.7 (H) 12.0 - 15.0 g/dL   HCT 49.0 (H) 36.0 - 46.0 %   Comment NOTIFIED PHYSICIAN   Blood gas, venous     Status: Abnormal   Collection Time: 10/27/17  2:58 PM  Result Value Ref Range   pH, Ven 7.225 (L) 7.250 - 7.430   pCO2, Ven 55.5 44.0 - 60.0 mmHg   pO2, Ven  32.0 - 45.0 mmHg    CRITICAL RESULT CALLED TO, READ BACK BY AND VERIFIED WITH:    Comment: aSHLEY lASSITER RN BY ROBIN POWELL RRT ON 10/27/17 AT 1503   Bicarbonate 22.1 20.0 - 28.0 mmol/L   Acid-base deficit 5.9 (H) 0.0 - 2.0 mmol/L   O2 Saturation 53.5 %   Patient temperature 98.6    Collection site DRAWN BY RN    Drawn by [phone]    Sample type VENOUS   CBG monitoring, ED     Status: Abnormal   Collection Time: 10/27/17  3:02 PM  Result Value Ref Range   Glucose-Capillary 586 (HH) 65 - 99 mg/dL  Acetaminophen level     Status: Abnormal   Collection Time: 10/27/17  5:30 PM  Result Value Ref Range   Acetaminophen (Tylenol), Serum <10 (L) 10 - 30 ug/mL    Comment:        THERAPEUTIC CONCENTRATIONS VARY SIGNIFICANTLY. A RANGE OF 10-30 ug/mL MAY BE AN EFFECTIVE CONCENTRATION FOR MANY PATIENTS. HOWEVER, SOME ARE BEST TREATED AT CONCENTRATIONS OUTSIDE THIS RANGE. ACETAMINOPHEN CONCENTRATIONS >150 ug/mL AT 4 HOURS AFTER INGESTION AND >50 ug/mL AT 12 HOURS AFTER INGESTION ARE OFTEN ASSOCIATED WITH TOXIC REACTIONS.   CBG monitoring, ED     Status: Abnormal   Collection Time: 10/27/17  5:30 PM  Result Value Ref Range   Glucose-Capillary 395 (H) 65 - 99 mg/dL  CBG monitoring, ED     Status: Abnormal   Collection Time: 10/27/17  6:42 PM  Result Value Ref Range   Glucose-Capillary 329 (H) 65 - 99 mg/dL  MRSA PCR Screening     Status: Abnormal   Collection Time: 10/27/17  8:36 PM  Result Value Ref Range   MRSA by PCR POSITIVE (A) NEGATIVE    Comment:        The GeneXpert MRSA Assay  (FDA approved for NASAL specimens only), is one component of a comprehensive MRSA colonization surveillance program. It is not intended to diagnose MRSA infection nor to guide or monitor treatment for MRSA infections. RESULT CALLED TO, READ BACK BY AND VERIFIED WITH: Dereck Ligas RN [id number] 10/28/17 A NAVARRO   Glucose, capillary  Status: Abnormal   Collection Time: 10/27/17  8:39 PM  Result Value Ref Range   Glucose-Capillary 268 (H) 65 - 99 mg/dL  Basic metabolic panel     Status: Abnormal   Collection Time: 10/27/17  8:43 PM  Result Value Ref Range   Sodium 138 135 - 145 mmol/L   Potassium 3.3 (L) 3.5 - 5.1 mmol/L    Comment: DELTA CHECK NOTED REPEATED TO VERIFY NO VISIBLE HEMOLYSIS    Chloride 105 101 - 111 mmol/L   CO2 25 22 - 32 mmol/L   Glucose, Bld 248 (H) 65 - 99 mg/dL   BUN 15 6 - 20 mg/dL   Creatinine, Ser 0.73 0.44 - 1.00 mg/dL   Calcium 8.8 (L) 8.9 - 10.3 mg/dL   GFR calc non Af Amer >60 >60 mL/min   GFR calc Af Amer >60 >60 mL/min    Comment: (NOTE) The eGFR has been calculated using the CKD EPI equation. This calculation has not been validated in all clinical situations. eGFR's persistently <60 mL/min signify possible Chronic Kidney Disease.    Anion gap 8 5 - 15  Glucose, capillary     Status: Abnormal   Collection Time: 10/27/17  9:44 PM  Result Value Ref Range   Glucose-Capillary 183 (H) 65 - 99 mg/dL  Glucose, capillary     Status: Abnormal   Collection Time: 10/27/17 11:07 PM  Result Value Ref Range   Glucose-Capillary 158 (H) 65 - 99 mg/dL  Glucose, capillary     Status: Abnormal   Collection Time: 10/28/17 12:04 AM  Result Value Ref Range   Glucose-Capillary 171 (H) 65 - 99 mg/dL  Basic metabolic panel     Status: Abnormal   Collection Time: 10/28/17 12:17 AM  Result Value Ref Range   Sodium 137 135 - 145 mmol/L   Potassium 3.4 (L) 3.5 - 5.1 mmol/L   Chloride 105 101 - 111 mmol/L   CO2 24 22 - 32 mmol/L   Glucose, Bld 183 (H) 65 - 99  mg/dL   BUN 15 6 - 20 mg/dL   Creatinine, Ser 0.65 0.44 - 1.00 mg/dL   Calcium 8.5 (L) 8.9 - 10.3 mg/dL   GFR calc non Af Amer >60 >60 mL/min   GFR calc Af Amer >60 >60 mL/min    Comment: (NOTE) The eGFR has been calculated using the CKD EPI equation. This calculation has not been validated in all clinical situations. eGFR's persistently <60 mL/min signify possible Chronic Kidney Disease.    Anion gap 8 5 - 15  Glucose, capillary     Status: Abnormal   Collection Time: 10/28/17 12:56 AM  Result Value Ref Range   Glucose-Capillary 161 (H) 65 - 99 mg/dL  Glucose, capillary     Status: Abnormal   Collection Time: 10/28/17  1:58 AM  Result Value Ref Range   Glucose-Capillary 156 (H) 65 - 99 mg/dL  Glucose, capillary     Status: Abnormal   Collection Time: 10/28/17  3:53 AM  Result Value Ref Range   Glucose-Capillary 154 (H) 65 - 99 mg/dL  Glucose, capillary     Status: Abnormal   Collection Time: 10/28/17  7:38 AM  Result Value Ref Range   Glucose-Capillary 135 (H) 65 - 99 mg/dL  Comprehensive metabolic panel     Status: Abnormal   Collection Time: 10/28/17  8:05 AM  Result Value Ref Range   Sodium 138 135 - 145 mmol/L   Potassium 3.5 3.5 - 5.1 mmol/L  Chloride 104 101 - 111 mmol/L   CO2 26 22 - 32 mmol/L   Glucose, Bld 135 (H) 65 - 99 mg/dL   BUN 14 6 - 20 mg/dL   Creatinine, Ser 0.67 0.44 - 1.00 mg/dL   Calcium 8.7 (L) 8.9 - 10.3 mg/dL   Total Protein 6.0 (L) 6.5 - 8.1 g/dL   Albumin 3.0 (L) 3.5 - 5.0 g/dL   AST 125 (H) 15 - 41 U/L   ALT 194 (H) 14 - 54 U/L   Alkaline Phosphatase 87 38 - 126 U/L   Total Bilirubin 0.4 0.3 - 1.2 mg/dL   GFR calc non Af Amer >60 >60 mL/min   GFR calc Af Amer >60 >60 mL/min    Comment: (NOTE) The eGFR has been calculated using the CKD EPI equation. This calculation has not been validated in all clinical situations. eGFR's persistently <60 mL/min signify possible Chronic Kidney Disease.    Anion gap 8 5 - 15    Current  Facility-Administered Medications  Medication Dose Route Frequency Provider Last Rate Last Dose  . Chlorhexidine Gluconate Cloth 2 % PADS 6 each  6 each Topical Q0600 Gherghe, Costin M, MD      . enoxaparin (LOVENOX) injection 40 mg  40 mg Subcutaneous Q24H Caren Griffins, MD   40 mg at 10/27/17 2052  . insulin aspart (novoLOG) injection 0-20 Units  0-20 Units Subcutaneous Q4H Bodenheimer, Charles A, NP   3 Units at 10/28/17 0739  . insulin glargine (LANTUS) injection 15 Units  15 Units Subcutaneous QHS Gherghe, Costin M, MD      . mupirocin ointment (BACTROBAN) 2 % 1 application  1 application Nasal BID Gherghe, Costin M, MD      . phenol (CHLORASEPTIC) mouth spray 1 spray  1 spray Mouth/Throat PRN Caren Griffins, MD        Musculoskeletal: Strength & Muscle Tone: within normal limits Gait & Station: UTA since patient was lying in bed. Patient leans: N/A  Psychiatric Specialty Exam: Physical Exam  Nursing note and vitals reviewed. Constitutional: She is oriented to person, place, and time. She appears well-developed and well-nourished.  HENT:  Head: Normocephalic and atraumatic.  Neck: Normal range of motion.  Respiratory: Effort normal.  Musculoskeletal: Normal range of motion.  Neurological: She is alert and oriented to person, place, and time.  Skin: No rash noted.  Psychiatric: Her speech is normal and behavior is normal. Thought content normal. Cognition and memory are normal. She expresses impulsivity. She exhibits a depressed mood.    Review of Systems  Constitutional: Negative for chills and fever.  Gastrointestinal: Negative for abdominal pain, constipation, diarrhea, nausea and vomiting.  Psychiatric/Behavioral: Positive for depression, substance abuse and suicidal ideas. Negative for hallucinations. The patient is nervous/anxious and has insomnia.   All other systems reviewed and are negative.   Blood pressure (!) 155/111, pulse 78, temperature 99.3 F (37.4 C),  temperature source Oral, resp. rate 14, height _0  (1.753 m), weight 130.6 kg (287 lb 14.7 oz), SpO2 98 %.Body mass index is 42.52 kg/m.  General Appearance: Fairly Groomed, middle aged, Caucasian female, wearing a hospital gown and lying in bed. NAD.   Eye Contact:  Good  Speech:  Clear and Coherent and Normal Rate  Volume:  Normal  Mood:  "I don't want to be here anymore."  Affect:  Depressed  Thought Process:  Goal Directed and Linear  Orientation:  Full (Time, Place, and Person)  Thought Content:  Logical  Suicidal Thoughts:  Yes.  without intent/plan  Homicidal Thoughts:  No  Memory:  Immediate;   Good Recent;   Good Remote;   Good  Judgement:  Poor  Insight:  Good  Psychomotor Activity:  Normal  Concentration:  Concentration: Good and Attention Span: Good  Recall:  Good  Fund of Knowledge:  Good  Language:  Good  Akathisia:  No  Handed:  Right  AIMS (if indicated):   N/A  Assets:  Warehouse manager Resources/Insurance  ADL's:  Intact  Cognition:  WNL  Sleep:   N/A   Assessment:  Claudia Erickson is a 51 y.o. female who was admitted following a Fentanyl and Klonopin overdose. She reports depressive symptoms and ongoing SI. She warrants inpatient psychiatric hospitalization for stabilization and treatment.   Treatment Plan Summary: -Patient warrants inpatient psychiatric hospitalization given high risk of harm to self. -Continue bedside sitter.  -Restart home Remeron 15 mg qhs for depression, anxiety and insomnia.  -Please pursue involuntary commitment if patient refuses voluntary psychiatric hospitalization or attempts to leave the hospital.  -Will sign off on patient at this time. Please consult psychiatry again as needed.     Disposition: Recommend psychiatric Inpatient admission when medically cleared.  Faythe Dingwall, DO 10/28/2017 10:04 AM

## 2017-10-28 NOTE — Progress Notes (Signed)
PROGRESS NOTE  Claudia Bickersllison B Cutting WUJ:[id number] DOB: 04/27/1966 DOA: 10/27/2017 PCP: Julieanne MansonMulberry, Elizabeth, MD   LOS: 1 day   Brief Narrative / Interim history: Claudia Erickson is a 52 y.o. female with medical history significant of insulin-dependent diabetes mellitus, diabetic neuropathy, hypertension, anxiety and depression, polysubstance abuse, who presents to the hospital with acute encephalopathy in the setting of reported overdose.   Assessment & Plan: Active Problems:   Polysubstance abuse (HCC)   Diabetes mellitus (HCC)   Anxiety and depression   Hyperglycemia   Intentional drug overdose (HCC)   Suicidal ideation   Diabetic neuropathy (HCC)   Overdose    Intentional overdose -Patient tells me that she did intend to kill herself in the hotel room, she is under a lot of financial stressors, and she is homeless.  She is very upset that she is alive this morning and did not succeed in killing herself. -She is alert and oriented x4, her drowsiness has resolved, she is able to tolerate a diet -Psychiatry consulted, appreciate input, she appears medically ready for discharge to psychiatric hospital if deemed necessary by the psychiatrist -currently patient will not be able to leave the hospital and will need to be IVC if she wants to leave AMA  Hyperglycemia -Patient with type 2 diabetes mellitus, off of her insulin for several months due to financial constraints -She was maintained on insulin infusion overnight, her CBGs look much better this morning, placed on Lantus and sliding scale, continue on discharge  Elevated LFTs -We will monitor, unclear how much she ingested and what, likely elevated in the setting of polysubstance abuse, she is also saying that she has abused cocaine which can cause elevated LFTs -AST and ALT much improved this morning  Diabetic neuropathy -Hold gabapentin as it can further depress mental status  Depression/anxiety -Psychiatry to see, for now  hold her Celexa, hold Ativan  Polysubstance abuse -We will need social worker consultation   DVT prophylaxis: Lovenox Code Status: Full code Family Communication: No family at bedside Disposition Plan: Likely need inpatient psychiatric facility  Consultants:   Psychiatry  Procedures:   None   Antimicrobials:  None    Subjective: - no chest pain, shortness of breath, no abdominal pain, nausea or vomiting.   Objective: Vitals:   10/28/17 0730 10/28/17 0740 10/28/17 0755 10/28/17 0800  BP:  137/87  (!) 155/111  Pulse:  75  78  Resp:  18  14  Temp:   99.3 F (37.4 C)   TempSrc:   Oral   SpO2: 94% 94%  98%  Weight:      Height:        Intake/Output Summary (Last 24 hours) at 10/28/2017 1109 Last data filed at 10/27/2017 1922 Gross per 24 hour  Intake 2000 ml  Output 450 ml  Net 1550 ml   Filed Weights   10/27/17 1411 10/27/17 2025  Weight: 131.5 kg (290 lb) 130.6 kg (287 lb 14.7 oz)    Examination:  Constitutional: NAD Eyes: lids and conjunctivae normal ENMT: Mucous membranes are moist Respiratory: clear to auscultation bilaterally, no wheezing, no crackles. Normal respiratory effort. Cardiovascular: Regular rate and rhythm, no murmurs / rubs / gallops. No LE edema. Abdomen: no tenderness. Bowel sounds positive.  Musculoskeletal: no clubbing / cyanosis.  Skin: no rashes, lesions, ulcers. No induration Neurologic: CN 2-12 grossly intact. Strength 5/5 in all 4.    Data Reviewed: I have independently reviewed following labs and imaging studies   CBC: Recent Labs  Lab 10/27/17 1418 10/27/17 1456 10/28/17 0954  WBC 17.9*  --  12.5*  HGB 16.0* 16.7* 13.5  HCT 47.6* 49.0* 40.9  MCV 86.2  --  85.9  PLT 199  --  184   Basic Metabolic Panel: Recent Labs  Lab 10/27/17 1418 10/27/17 1456 10/27/17 2043 10/28/17 0017 10/28/17 0805  NA 134* 133* 138 137 138  K 4.7 4.9 3.3* 3.4* 3.5  CL 99* 99* 105 105 104  CO2 25  --  25 24 26   GLUCOSE 610* 638*  248* 183* 135*  BUN 16 17 15 15 14   CREATININE 1.04* 0.90 0.73 0.65 0.67  CALCIUM 8.9  --  8.8* 8.5* 8.7*   GFR: Estimated Creatinine Clearance: 119.5 mL/min (by C-G formula based on SCr of 0.67 mg/dL). Liver Function Tests: Recent Labs  Lab 10/27/17 1418 10/28/17 0805  AST 520* 125*  ALT 299* 194*  ALKPHOS 119 87  BILITOT 0.3 0.4  PROT 7.2 6.0*  ALBUMIN 3.7 3.0*   No results for input(s): LIPASE, AMYLASE in the last 168 hours. No results for input(s): AMMONIA in the last 168 hours. Coagulation Profile: No results for input(s): INR, PROTIME in the last 168 hours. Cardiac Enzymes: No results for input(s): CKTOTAL, CKMB, CKMBINDEX, TROPONINI in the last 168 hours. BNP (last 3 results) No results for input(s): PROBNP in the last 8760 hours. HbA1C: No results for input(s): HGBA1C in the last 72 hours. CBG: Recent Labs  Lab 10/28/17 0004 10/28/17 0056 10/28/17 0158 10/28/17 0353 10/28/17 0738  GLUCAP 171* 161* 156* 154* 135*   Lipid Profile: No results for input(s): CHOL, HDL, LDLCALC, TRIG, CHOLHDL, LDLDIRECT in the last 72 hours. Thyroid Function Tests: No results for input(s): TSH, T4TOTAL, FREET4, T3FREE, THYROIDAB in the last 72 hours. Anemia Panel: No results for input(s): VITAMINB12, FOLATE, FERRITIN, TIBC, IRON, RETICCTPCT in the last 72 hours. Urine analysis:    Component Value Date/Time   COLORURINE YELLOW 07/27/2014 1749   APPEARANCEUR CLOUDY (A) 07/27/2014 1749   LABSPEC 1.039 (H) 07/27/2014 1749   PHURINE 6.0 07/27/2014 1749   GLUCOSEU >1000 (A) 07/27/2014 1749   HGBUR SMALL (A) 07/27/2014 1749   BILIRUBINUR negative 04/08/2015 1117   KETONESUR NEGATIVE 07/27/2014 1749   PROTEINUR smal 1+ 04/08/2015 1117   PROTEINUR 30 (A) 07/27/2014 1749   UROBILINOGEN negative 04/08/2015 1117   UROBILINOGEN 0.2 07/27/2014 1749   NITRITE negative 04/08/2015 1117   NITRITE NEGATIVE 07/27/2014 1749   LEUKOCYTESUR moderate (2+) (A) 04/08/2015 1117   Sepsis  Labs: Invalid input(s): PROCALCITONIN, LACTICIDVEN  Recent Results (from the past 240 hour(s))  MRSA PCR Screening     Status: Abnormal   Collection Time: 10/27/17  8:36 PM  Result Value Ref Range Status   MRSA by PCR POSITIVE (A) NEGATIVE Final    Comment:        The GeneXpert MRSA Assay (FDA approved for NASAL specimens only), is one component of a comprehensive MRSA colonization surveillance program. It is not intended to diagnose MRSA infection nor to guide or monitor treatment for MRSA infections. RESULT CALLED TO, READ BACK BY AND VERIFIED WITH: Lindwood Coke RN 1610 10/28/17 A [facility]       Radiology Studies: Dg Chest Portable 1 View  Result Date: 10/27/2017 CLINICAL DATA:  Altered mental status and recent apparent overdose EXAM: PORTABLE CHEST 1 VIEW COMPARISON:  10/27/2016 FINDINGS: The heart size and mediastinal contours are within normal limits. Aortic calcifications are again seen. Both lungs are clear. The visualized skeletal structures  are unremarkable. IMPRESSION: No acute abnormality noted. Electronically Signed   By: Alcide Clever M.D.   On: 10/27/2017 14:33     Scheduled Meds: . Chlorhexidine Gluconate Cloth  6 each Topical Q0600  . enoxaparin (LOVENOX) injection  40 mg Subcutaneous Q24H  . insulin aspart  0-20 Units Subcutaneous Q4H  . insulin glargine  15 Units Subcutaneous QHS  . mupirocin ointment  1 application Nasal BID   Continuous Infusions:   Pamella Pert, MD, PhD [facility] Pager [phone]  If 7PM-7AM, please contact night-coverage [email or web address] Password [facility] 10/28/2017, 11:09 AM

## 2017-10-28 NOTE — Progress Notes (Signed)
Inpatient Diabetes Program Recommendations  AACE/ADA: New Consensus Statement on Inpatient Glycemic Control (2015)  Target Ranges:  Prepandial:   less than 140 mg/dL      Peak postprandial:   less than 180 mg/dL (1-2 hours)      Critically ill patients:  140 - 180 mg/dL   Lab Results  Component Value Date   GLUCAP 135 (H) 10/28/2017   HGBA1C 10.0 (H) 03/21/2017    Review of Glycemic Control  Diabetes history: DM2 Outpatient Diabetes medications: None - previously on 70/30 40 units in am and 30 units ac supper Current orders for Inpatient glycemic control: Lantus 15 units QHS, Novolog 0-20 units Q4H    Inpatient Diabetes Program Recommendations:     Needs updated HgbA1C to assess glycemic control prior to admission Change Novolog to tidwc and hs since eating Add Novolog 5 units tidwc for meal coverage insulin If FBS > 180 mg/dL, titrate Lantus.  Continue to follow.  Thank you. Ailene Ardshonda Vence Lalor, RD, LDN, CDE Inpatient Diabetes Coordinator [phone]

## 2017-10-29 ENCOUNTER — Other Ambulatory Visit: Payer: Self-pay

## 2017-10-29 ENCOUNTER — Encounter (HOSPITAL_COMMUNITY): Payer: Self-pay

## 2017-10-29 ENCOUNTER — Inpatient Hospital Stay (HOSPITAL_COMMUNITY)
Admission: AD | Admit: 2017-10-29 | Discharge: 2017-11-07 | DRG: 885 | Disposition: A | Discharge status: Home / Self Care | Payer: Federal, State, Local not specified - Other | Source: Intra-hospital | Attending: Psychiatry | Admitting: Psychiatry

## 2017-10-29 DIAGNOSIS — G4700 Insomnia, unspecified: Secondary | ICD-10-CM | POA: Diagnosis present

## 2017-10-29 DIAGNOSIS — Z23 Encounter for immunization: Secondary | ICD-10-CM | POA: Diagnosis not present

## 2017-10-29 DIAGNOSIS — F332 Major depressive disorder, recurrent severe without psychotic features: Secondary | ICD-10-CM | POA: Diagnosis not present

## 2017-10-29 DIAGNOSIS — F4000 Agoraphobia, unspecified: Secondary | ICD-10-CM | POA: Diagnosis present

## 2017-10-29 DIAGNOSIS — E1142 Type 2 diabetes mellitus with diabetic polyneuropathy: Secondary | ICD-10-CM | POA: Diagnosis present

## 2017-10-29 DIAGNOSIS — F17210 Nicotine dependence, cigarettes, uncomplicated: Secondary | ICD-10-CM | POA: Diagnosis present

## 2017-10-29 DIAGNOSIS — M549 Dorsalgia, unspecified: Secondary | ICD-10-CM | POA: Diagnosis not present

## 2017-10-29 DIAGNOSIS — I10 Essential (primary) hypertension: Secondary | ICD-10-CM | POA: Diagnosis present

## 2017-10-29 DIAGNOSIS — F1410 Cocaine abuse, uncomplicated: Secondary | ICD-10-CM | POA: Diagnosis present

## 2017-10-29 DIAGNOSIS — Z813 Family history of other psychoactive substance abuse and dependence: Secondary | ICD-10-CM | POA: Diagnosis not present

## 2017-10-29 DIAGNOSIS — F1994 Other psychoactive substance use, unspecified with psychoactive substance-induced mood disorder: Secondary | ICD-10-CM | POA: Diagnosis present

## 2017-10-29 DIAGNOSIS — T1491XA Suicide attempt, initial encounter: Secondary | ICD-10-CM | POA: Diagnosis not present

## 2017-10-29 DIAGNOSIS — E119 Type 2 diabetes mellitus without complications: Secondary | ICD-10-CM | POA: Diagnosis not present

## 2017-10-29 DIAGNOSIS — F39 Unspecified mood [affective] disorder: Secondary | ICD-10-CM | POA: Diagnosis not present

## 2017-10-29 DIAGNOSIS — F322 Major depressive disorder, single episode, severe without psychotic features: Secondary | ICD-10-CM | POA: Diagnosis not present

## 2017-10-29 DIAGNOSIS — Z597 Insufficient social insurance and welfare support: Secondary | ICD-10-CM

## 2017-10-29 DIAGNOSIS — R45851 Suicidal ideations: Secondary | ICD-10-CM | POA: Diagnosis present

## 2017-10-29 DIAGNOSIS — G8929 Other chronic pain: Secondary | ICD-10-CM | POA: Diagnosis present

## 2017-10-29 DIAGNOSIS — M545 Low back pain: Secondary | ICD-10-CM | POA: Diagnosis present

## 2017-10-29 DIAGNOSIS — B379 Candidiasis, unspecified: Secondary | ICD-10-CM | POA: Diagnosis not present

## 2017-10-29 DIAGNOSIS — Z794 Long term (current) use of insulin: Secondary | ICD-10-CM

## 2017-10-29 DIAGNOSIS — Z915 Personal history of self-harm: Secondary | ICD-10-CM | POA: Diagnosis not present

## 2017-10-29 DIAGNOSIS — R350 Frequency of micturition: Secondary | ICD-10-CM | POA: Diagnosis present

## 2017-10-29 DIAGNOSIS — B373 Candidiasis of vulva and vagina: Secondary | ICD-10-CM | POA: Diagnosis present

## 2017-10-29 DIAGNOSIS — F4310 Post-traumatic stress disorder, unspecified: Secondary | ICD-10-CM | POA: Diagnosis present

## 2017-10-29 DIAGNOSIS — K219 Gastro-esophageal reflux disease without esophagitis: Secondary | ICD-10-CM | POA: Diagnosis present

## 2017-10-29 DIAGNOSIS — F410 Panic disorder [episodic paroxysmal anxiety] without agoraphobia: Secondary | ICD-10-CM | POA: Diagnosis present

## 2017-10-29 DIAGNOSIS — Z560 Unemployment, unspecified: Secondary | ICD-10-CM | POA: Diagnosis not present

## 2017-10-29 DIAGNOSIS — T424X2A Poisoning by benzodiazepines, intentional self-harm, initial encounter: Secondary | ICD-10-CM | POA: Diagnosis not present

## 2017-10-29 DIAGNOSIS — T404X2A Poisoning by other synthetic narcotics, intentional self-harm, initial encounter: Secondary | ICD-10-CM | POA: Diagnosis not present

## 2017-10-29 DIAGNOSIS — B3731 Acute candidiasis of vulva and vagina: Secondary | ICD-10-CM | POA: Clinically undetermined

## 2017-10-29 LAB — GLUCOSE, CAPILLARY
GLUCOSE-CAPILLARY: 200 mg/dL — AB (ref 65–99)
GLUCOSE-CAPILLARY: 333 mg/dL — AB (ref 65–99)
Glucose-Capillary: 199 mg/dL — ABNORMAL HIGH (ref 65–99)
Glucose-Capillary: 256 mg/dL — ABNORMAL HIGH (ref 65–99)
Glucose-Capillary: 264 mg/dL — ABNORMAL HIGH (ref 65–99)

## 2017-10-29 MED ORDER — INSULIN ASPART 100 UNIT/ML ~~LOC~~ SOLN
0.0000 [IU] | Freq: Three times a day (TID) | SUBCUTANEOUS | Status: DC
  Administered 2017-10-30: 15 [IU] via SUBCUTANEOUS
  Administered 2017-10-30 (×2): 7 [IU] via SUBCUTANEOUS
  Administered 2017-10-31 (×2): 4 [IU] via SUBCUTANEOUS
  Administered 2017-10-31: 11 [IU] via SUBCUTANEOUS
  Administered 2017-11-01 (×3): 7 [IU] via SUBCUTANEOUS
  Administered 2017-11-02: 11 [IU] via SUBCUTANEOUS
  Administered 2017-11-02 – 2017-11-03 (×3): 4 [IU] via SUBCUTANEOUS
  Administered 2017-11-03: 11 [IU] via SUBCUTANEOUS
  Administered 2017-11-03: 7 [IU] via SUBCUTANEOUS
  Administered 2017-11-04: 4 [IU] via SUBCUTANEOUS
  Administered 2017-11-04: 11 [IU] via SUBCUTANEOUS
  Administered 2017-11-04: 7 [IU] via SUBCUTANEOUS
  Administered 2017-11-05: 11 [IU] via SUBCUTANEOUS
  Administered 2017-11-05: 4 [IU] via SUBCUTANEOUS
  Administered 2017-11-05: 7 [IU] via SUBCUTANEOUS
  Administered 2017-11-06: 4 [IU] via SUBCUTANEOUS
  Administered 2017-11-06: 7 [IU] via SUBCUTANEOUS
  Administered 2017-11-06: 4 [IU] via SUBCUTANEOUS
  Administered 2017-11-07 (×2): 7 [IU] via SUBCUTANEOUS

## 2017-10-29 MED ORDER — INSULIN ASPART 100 UNIT/ML ~~LOC~~ SOLN
4.0000 [IU] | Freq: Three times a day (TID) | SUBCUTANEOUS | Status: DC
  Administered 2017-10-29 – 2017-11-07 (×27): 4 [IU] via SUBCUTANEOUS

## 2017-10-29 MED ORDER — MIRTAZAPINE 15 MG PO TABS
15.0000 mg | ORAL_TABLET | Freq: Every day | ORAL | Status: DC
  Administered 2017-10-29 – 2017-11-04 (×7): 15 mg via ORAL
  Filled 2017-10-29 (×11): qty 1

## 2017-10-29 MED ORDER — ACETAMINOPHEN 325 MG PO TABS
650.0000 mg | ORAL_TABLET | Freq: Four times a day (QID) | ORAL | Status: DC | PRN
  Administered 2017-10-31 – 2017-11-07 (×6): 650 mg via ORAL
  Filled 2017-10-29 (×6): qty 2

## 2017-10-29 MED ORDER — CHLORHEXIDINE GLUCONATE CLOTH 2 % EX PADS
6.0000 | MEDICATED_PAD | Freq: Every day | CUTANEOUS | Status: AC
  Administered 2017-10-30 – 2017-10-31 (×3): 6 via TOPICAL
  Filled 2017-10-29 (×3): qty 6

## 2017-10-29 MED ORDER — INFLUENZA VAC SPLIT QUAD 0.5 ML IM SUSY
0.5000 mL | PREFILLED_SYRINGE | INTRAMUSCULAR | Status: AC
  Administered 2017-10-31: 0.5 mL via INTRAMUSCULAR
  Filled 2017-10-29: qty 0.5

## 2017-10-29 MED ORDER — INSULIN ASPART 100 UNIT/ML ~~LOC~~ SOLN
0.0000 [IU] | Freq: Every day | SUBCUTANEOUS | Status: DC
  Administered 2017-10-29 – 2017-10-30 (×2): 3 [IU] via SUBCUTANEOUS
  Administered 2017-10-31 – 2017-11-04 (×5): 2 [IU] via SUBCUTANEOUS
  Administered 2017-11-05 – 2017-11-06 (×2): 3 [IU] via SUBCUTANEOUS

## 2017-10-29 MED ORDER — MUPIROCIN 2 % EX OINT
1.0000 "application " | TOPICAL_OINTMENT | Freq: Two times a day (BID) | CUTANEOUS | Status: AC
  Administered 2017-10-29 – 2017-11-01 (×6): 1 via NASAL
  Filled 2017-10-29 (×2): qty 22

## 2017-10-29 MED ORDER — INSULIN ASPART 100 UNIT/ML ~~LOC~~ SOLN
0.0000 [IU] | SUBCUTANEOUS | Status: DC
  Administered 2017-10-29: 7 [IU] via SUBCUTANEOUS

## 2017-10-29 MED ORDER — INSULIN GLARGINE 100 UNIT/ML ~~LOC~~ SOLN
20.0000 [IU] | Freq: Every day | SUBCUTANEOUS | Status: DC
  Administered 2017-10-29 – 2017-11-06 (×9): 20 [IU] via SUBCUTANEOUS

## 2017-10-29 MED ORDER — INSULIN GLARGINE 100 UNIT/ML ~~LOC~~ SOLN
20.0000 [IU] | Freq: Every day | SUBCUTANEOUS | Status: DC
  Filled 2017-10-29: qty 0.2

## 2017-10-29 MED ORDER — MAGNESIUM HYDROXIDE 400 MG/5ML PO SUSP: 30.0000 mL | Freq: Every day | ORAL | Status: DC | PRN

## 2017-10-29 MED ORDER — HYDROXYZINE HCL 25 MG PO TABS
25.0000 mg | ORAL_TABLET | Freq: Three times a day (TID) | ORAL | Status: DC | PRN
  Administered 2017-10-30 – 2017-11-06 (×14): 25 mg via ORAL
  Filled 2017-10-29 (×2): qty 1
  Filled 2017-10-29: qty 10
  Filled 2017-10-29 (×12): qty 1

## 2017-10-29 MED ORDER — TRAZODONE HCL 50 MG PO TABS: 50.0000 mg | ORAL_TABLET | Freq: Every evening | ORAL | Status: DC | PRN

## 2017-10-29 MED ORDER — ALUM & MAG HYDROXIDE-SIMETH 200-200-20 MG/5ML PO SUSP: 30.0000 mL | ORAL | Status: DC | PRN

## 2017-10-29 MED ORDER — INSULIN ASPART 100 UNIT/ML ~~LOC~~ SOLN
4.0000 [IU] | Freq: Three times a day (TID) | SUBCUTANEOUS | Status: DC
  Administered 2017-10-29: 4 [IU] via SUBCUTANEOUS

## 2017-10-29 MED ORDER — NICOTINE 21 MG/24HR TD PT24
21.0000 mg | MEDICATED_PATCH | Freq: Every day | TRANSDERMAL | Status: DC
  Administered 2017-10-30 – 2017-11-02 (×3): 21 mg via TRANSDERMAL
  Filled 2017-10-29 (×12): qty 1

## 2017-10-29 NOTE — Progress Notes (Signed)
SATURATION QUALIFICATIONS: (This note is used to comply with regulatory documentation for home oxygen)  Patient Saturations on Room Air at Rest = 96%  Patient Saturations on Room Air while Ambulating = 99%  Patient Saturations on 0 Liters of oxygen while Ambulating = 99%  Please briefly explain why patient needs home oxygen: No need.   Claudia Erickson, Maegan Buller H, RN

## 2017-10-29 NOTE — Tx Team (Signed)
Initial Treatment Plan 10/29/2017 7:04 PM Glori Bickersllison B Giles WUX:[id number]    PATIENT STRESSORS: Financial difficulties Health problems Marital or family conflict Medication change or noncompliance Substance abuse   PATIENT STRENGTHS: Wellsite geologistCommunication skills General fund of knowledge Motivation for treatment/growth   PATIENT IDENTIFIED PROBLEMS: Depression  Suicidal ideation  Substance abuse  "I don't want to be suicidal anymore"               DISCHARGE CRITERIA:  Improved stabilization in mood, thinking, and/or behavior Verbal commitment to aftercare and medication compliance  PRELIMINARY DISCHARGE PLAN: Outpatient therapy Medication management  PATIENT/FAMILY INVOLVEMENT: This treatment plan has been presented to and reviewed with the patient, Glori Bickersllison B [place].  The patient and family have been given the opportunity to ask questions and make suggestions.  Levin BaconHeather V Caree Wolpert, RN 10/29/2017, 7:04 PM

## 2017-10-29 NOTE — Progress Notes (Signed)
[facility] IV removed by NT. Pt dressed in burgundy scrubs.  All personal belongings gathered in one bag and given to [place] transporter. AVS reviewed at bedside. No questions. Justin Mendaudle, Bernadetta Roell H

## 2017-10-29 NOTE — Progress Notes (Signed)
D Patient is observed lying on her side, in a fetal position in her bed. She is sobbing and she is upset. A SHe says " why does this have to happen to me..I have such a shit life..." A She avoids eye contact. She is sobbing into her pillow. She rocks back and forth, from side to side. R Safety is in place. Writer offered encouragement and support.

## 2017-10-29 NOTE — Discharge Summary (Signed)
Physician Discharge Summary  EPHRATA VERVILLE MWU:[id number] DOB: 01-06-1966 DOA: 10/27/2017  PCP: Julieanne Manson, MD  Admit date: 10/27/2017 Discharge date: 10/29/2017  Admitted From: home Disposition:  [facility]  Recommendations for Outpatient Follow-up:  1. Follow up with PCP in 1-2 weeks  Home Health: none Equipment/Devices: none  Discharge Condition: stable CODE STATUS: Full code Diet recommendation: diabetic  HPI:  Claudia Erickson is a 52 y.o. female with medical history significant of insulin-dependent diabetes mellitus, diabetic neuropathy, hypertension, anxiety and depression, polysubstance abuse, who presents to the hospital with acute encephalopathy in the setting of reported overdose.  She was found at the hotel where she is living by housekeeping, she was unresponsive and had a suicide note.  On my evaluation patient is confused and somnolent, however wakes up enough to tell me that she wanted to kill herself, she initially told me she took some heroin but later changed the story to fentanyl, and also took some Ativan.  She states that these medications were not prescribed but she took them from a friend.  She also has been out of her insulin and could not afford any insulin for the past several months. ED Course: In the ED she is afebrile, blood pressure is in the 90s, heart rate is normal and she is satting well on room air.  Her blood work is pertinent for mild acidosis with a venous pH of 7.22, CBG was found to be elevated into the 600 range, her bicarbonate is 25 and anion gap is 10.  She was started on insulin infusion and we are asked to admit for hypoglycemia/medication overdose.    Hospital Course:  Intentional overdose -Patient tells me that she did intend to kill herself in the hotel room, she is under a lot of financial stressors, and she is homeless.  She is very upset that she is alive and did not succeed in killing herself. Psychiatry was consulted and have  followed patient while hospitalized, recommending inpatient psych. She has remained clinically stable, drowsiness resolved and now she is alert x oriented x 4.  Hyperglycemia / Poorly controlled DM with neuropathy -Patient with type 2 diabetes mellitus, off of her insulin for several months due to financial constraints. Resume home regimen, monitor CBGs per protocol. She was probably in the 4-500s CBG at home without her insulin. Continue Gabapentin.  Elevated LFTs -unclear how much and what exactly she ingested , LFTs likely elevated in the setting of polysubstance abuse, she is also saying that she has abused cocaine which can cause elevated LFTs. Improved.  Depression/anxiety -she is to go to [facility]  Polysubstance abuse -she is to go to [facility]  Discharge Diagnoses:  Principal Problem:   Severe episode of recurrent major depressive disorder, without psychotic features (HCC) Active Problems:   Polysubstance abuse (HCC)   Diabetes mellitus (HCC)   Anxiety and depression   Hyperglycemia   Intentional drug overdose (HCC)   Suicidal ideation   Diabetic neuropathy (HCC)   Overdose     Discharge Instructions   Allergies as of 10/29/2017   No Known Allergies     Medication List    TAKE these medications   citalopram 40 MG tablet Commonly known as:  CELEXA Take 1 tablet (40 mg total) by mouth daily. For depression   gabapentin 400 MG capsule Commonly known as:  NEURONTIN Take 1 capsule (400 mg total) by mouth 3 (three) times daily. Increase by 1 every 3 days as tolerated until taking 2 three  times a day.For agitation/Neuropathic pain   hydrochlorothiazide 25 MG tablet Commonly known as:  HYDRODIURIL Take 1 tablet (25 mg total) by mouth daily. For high blood pressure   insulin aspart protamine- aspart (70-30) 100 UNIT/ML injection Commonly known as:  NOVOLOG MIX 70/30 Inject 40 units into the skin with breakfast & 35 units in to the Skin at supper: For diabetes management What  changed:  additional instructions   LORazepam 1 MG tablet Commonly known as:  ATIVAN 1/2-1 tablet by mouth as needed   metFORMIN 850 MG tablet Commonly known as:  GLUCOPHAGE Take 1 tablet (850 mg total) by mouth 2 (two) times daily with a meal. For diabetes management   mirtazapine 15 MG tablet Commonly known as:  REMERON Take 1 tablet (15 mg total) by mouth at bedtime. For depression/sleep   nicotine 21 mg/24hr patch Commonly known as:  NICODERM CQ - dosed in mg/24 hours Place 1 patch (21 mg total) onto the skin daily. For smoking cessation   omeprazole 20 MG tablet Commonly known as:  PRILOSEC OTC Take 1 tablet (20 mg total) by mouth daily. For acid reflux        Consultations:  Psychiatry   Procedures/Studies:  Dg Chest 2 View  Result Date: 10/28/2017 CLINICAL DATA:  52 year old female with a history of encephalopathy EXAM: CHEST  2 VIEW COMPARISON:  10/27/2017 FINDINGS: Cardiomediastinal silhouette unchanged in size and contour. No evidence of central vascular congestion. No interlobular septal thickening. No confluent airspace disease. No pleural effusion or pneumothorax. Degenerative changes of the spine without acute fracture identified. Surgical changes of the left glenoid. IMPRESSION: No radiographic evidence of acute cardiopulmonary disease. Electronically Signed   By: Gilmer Mor D.O.   On: 10/28/2017 15:47   Dg Chest Portable 1 View  Result Date: 10/27/2017 CLINICAL DATA:  Altered mental status and recent apparent overdose EXAM: PORTABLE CHEST 1 VIEW COMPARISON:  10/27/2016 FINDINGS: The heart size and mediastinal contours are within normal limits. Aortic calcifications are again seen. Both lungs are clear. The visualized skeletal structures are unremarkable. IMPRESSION: No acute abnormality noted. Electronically Signed   By: Alcide Clever M.D.   On: 10/27/2017 14:33      Subjective: - no chest pain, shortness of breath, no abdominal pain, nausea or vomiting.     Discharge Exam: Vitals:   10/29/17 1109 10/29/17 1151  BP: (!) 140/98   Pulse: 85   Resp: 20   Temp: 98.4 F (36.9 C)   SpO2: 96% 99%   General: Pt is alert, awake, not in acute distress Cardiovascular: RRR, S1/S2 +, no rubs, no gallops Respiratory: CTA bilaterally, no wheezing, no rhonchi Abdominal: Soft, NT, ND, bowel sounds + Extremities: no edema, no cyanosis   The results of significant diagnostics from this hospitalization (including imaging, microbiology, ancillary and laboratory) are listed below for reference.    Microbiology: Recent Results (from the past 240 hour(s))  MRSA PCR Screening     Status: Abnormal   Collection Time: 10/27/17  8:36 PM  Result Value Ref Range Status   MRSA by PCR POSITIVE (A) NEGATIVE Final    Comment:        The GeneXpert MRSA Assay (FDA approved for NASAL specimens only), is one component of a comprehensive MRSA colonization surveillance program. It is not intended to diagnose MRSA infection nor to guide or monitor treatment for MRSA infections. RESULT CALLED TO, READ BACK BY AND VERIFIED WITHLindwood Coke RN 1610 10/28/17 A [facility]  Labs: BNP (last 3 results) No results for input(s): BNP in the last 8760 hours. Basic Metabolic Panel: Recent Labs  Lab 10/27/17 1418 10/27/17 1456 10/27/17 2043 10/28/17 0017 10/28/17 0805  NA 134* 133* 138 137 138  K 4.7 4.9 3.3* 3.4* 3.5  CL 99* 99* 105 105 104  CO2 25  --  25 24 26   GLUCOSE 610* 638* 248* 183* 135*  BUN 16 17 15 15 14   CREATININE 1.04* 0.90 0.73 0.65 0.67  CALCIUM 8.9  --  8.8* 8.5* 8.7*   Liver Function Tests: Recent Labs  Lab 10/27/17 1418 10/28/17 0805  AST 520* 125*  ALT 299* 194*  ALKPHOS 119 87  BILITOT 0.3 0.4  PROT 7.2 6.0*  ALBUMIN 3.7 3.0*   No results for input(s): LIPASE, AMYLASE in the last 168 hours. No results for input(s): AMMONIA in the last 168 hours. CBC: Recent Labs  Lab 10/27/17 1418 10/27/17 1456 10/28/17 0954  WBC 17.9*   --  12.5*  HGB 16.0* 16.7* 13.5  HCT 47.6* 49.0* 40.9  MCV 86.2  --  85.9  PLT 199  --  184   Cardiac Enzymes: No results for input(s): CKTOTAL, CKMB, CKMBINDEX, TROPONINI in the last 168 hours. BNP: Invalid input(s): POCBNP CBG: Recent Labs  Lab 10/28/17 2018 10/28/17 2320 10/29/17 0324 10/29/17 0804 10/29/17 1141  GLUCAP 249* 220* 199* 200* 333*   D-Dimer No results for input(s): DDIMER in the last 72 hours. Hgb A1c No results for input(s): HGBA1C in the last 72 hours. Lipid Profile No results for input(s): CHOL, HDL, LDLCALC, TRIG, CHOLHDL, LDLDIRECT in the last 72 hours. Thyroid function studies No results for input(s): TSH, T4TOTAL, T3FREE, THYROIDAB in the last 72 hours.  Invalid input(s): FREET3 Anemia work up No results for input(s): VITAMINB12, FOLATE, FERRITIN, TIBC, IRON, RETICCTPCT in the last 72 hours. Urinalysis    Component Value Date/Time   COLORURINE YELLOW 07/27/2014 1749   APPEARANCEUR CLOUDY (A) 07/27/2014 1749   LABSPEC 1.039 (H) 07/27/2014 1749   PHURINE 6.0 07/27/2014 1749   GLUCOSEU >1000 (A) 07/27/2014 1749   HGBUR SMALL (A) 07/27/2014 1749   BILIRUBINUR negative 04/08/2015 1117   KETONESUR NEGATIVE 07/27/2014 1749   PROTEINUR smal 1+ 04/08/2015 1117   PROTEINUR 30 (A) 07/27/2014 1749   UROBILINOGEN negative 04/08/2015 1117   UROBILINOGEN 0.2 07/27/2014 1749   NITRITE negative 04/08/2015 1117   NITRITE NEGATIVE 07/27/2014 1749   LEUKOCYTESUR moderate (2+) (A) 04/08/2015 1117   Sepsis Labs Invalid input(s): PROCALCITONIN,  WBC,  LACTICIDVEN   Time coordinating discharge: 35 minutes  SIGNED:  Pamella Pertostin Alichia Alridge, MD  [facility] 10/29/2017, 12:08 PM Pager [phone]  If 7PM-7AM, please contact night-coverage [email or web address] Password [email or web address]
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
## 2017-10-29 NOTE — Progress Notes (Signed)
Clinical Social Worker was consulted to assist patient in getting placed in  Inpatient psych. [facility] Patient signed a voluntary consent and [facility] RN to call [phone] for report.  Patient to be placed in room :4012 Accepting physician: Dr.Norman Attending physician: Theotis Barrior.Cobus

## 2017-10-29 NOTE — Progress Notes (Signed)
Patient ID: Claudia Erickson, female   DOB: 01/12/1966, 52 y.o.   MRN: [id number]  Pt refused 2000 cbg and insulin administration. In no current distress. Will continue to monitor.

## 2017-10-29 NOTE — Progress Notes (Signed)
Pt attended evening AA group. 

## 2017-10-30 DIAGNOSIS — T404X2A Poisoning by other synthetic narcotics, intentional self-harm, initial encounter: Secondary | ICD-10-CM

## 2017-10-30 DIAGNOSIS — Z62810 Personal history of physical and sexual abuse in childhood: Secondary | ICD-10-CM

## 2017-10-30 DIAGNOSIS — F332 Major depressive disorder, recurrent severe without psychotic features: Principal | ICD-10-CM

## 2017-10-30 DIAGNOSIS — T1491XA Suicide attempt, initial encounter: Secondary | ICD-10-CM

## 2017-10-30 DIAGNOSIS — T424X2A Poisoning by benzodiazepines, intentional self-harm, initial encounter: Secondary | ICD-10-CM

## 2017-10-30 DIAGNOSIS — F4310 Post-traumatic stress disorder, unspecified: Secondary | ICD-10-CM

## 2017-10-30 DIAGNOSIS — Z915 Personal history of self-harm: Secondary | ICD-10-CM

## 2017-10-30 DIAGNOSIS — R51 Headache: Secondary | ICD-10-CM

## 2017-10-30 LAB — GLUCOSE, CAPILLARY
GLUCOSE-CAPILLARY: 322 mg/dL — AB (ref 65–99)
Glucose-Capillary: 226 mg/dL — ABNORMAL HIGH (ref 65–99)
Glucose-Capillary: 238 mg/dL — ABNORMAL HIGH (ref 65–99)
Glucose-Capillary: 260 mg/dL — ABNORMAL HIGH (ref 65–99)

## 2017-10-30 LAB — URINALYSIS, ROUTINE W REFLEX MICROSCOPIC
Bilirubin Urine: NEGATIVE
KETONES UR: NEGATIVE mg/dL
NITRITE: NEGATIVE
PROTEIN: NEGATIVE mg/dL
Specific Gravity, Urine: 1.017 (ref 1.005–1.030)
pH: 6 (ref 5.0–8.0)

## 2017-10-30 MED ORDER — PANTOPRAZOLE SODIUM 40 MG PO TBEC
40.0000 mg | DELAYED_RELEASE_TABLET | Freq: Every day | ORAL | Status: DC
Start: 2017-10-30 — End: 2017-11-07
  Administered 2017-10-30 – 2017-11-07 (×9): 40 mg via ORAL
  Filled 2017-10-30: qty 1
  Filled 2017-10-30: qty 7
  Filled 2017-10-30 (×5): qty 1
  Filled 2017-10-30 (×2): qty 7
  Filled 2017-10-30 (×3): qty 1

## 2017-10-30 MED ORDER — HYDROCHLOROTHIAZIDE 25 MG PO TABS
ORAL_TABLET | ORAL | Status: AC
  Administered 2017-10-30: 25 mg via ORAL
  Filled 2017-10-30: qty 1

## 2017-10-30 MED ORDER — GABAPENTIN 400 MG PO CAPS
400.0000 mg | ORAL_CAPSULE | Freq: Three times a day (TID) | ORAL | Status: DC
  Administered 2017-10-30 – 2017-11-07 (×26): 400 mg via ORAL
  Filled 2017-10-30 (×3): qty 21
  Filled 2017-10-30 (×2): qty 1
  Filled 2017-10-30 (×2): qty 21
  Filled 2017-10-30 (×10): qty 1
  Filled 2017-10-30 (×2): qty 21
  Filled 2017-10-30 (×4): qty 1
  Filled 2017-10-30: qty 21
  Filled 2017-10-30 (×11): qty 1
  Filled 2017-10-30: qty 21

## 2017-10-30 MED ORDER — DULOXETINE HCL 30 MG PO CPEP
30.0000 mg | ORAL_CAPSULE | Freq: Every day | ORAL | Status: DC
  Administered 2017-10-31 – 2017-11-01 (×2): 30 mg via ORAL
  Filled 2017-10-30 (×4): qty 1

## 2017-10-30 MED ORDER — METFORMIN HCL 850 MG PO TABS
850.0000 mg | ORAL_TABLET | Freq: Two times a day (BID) | ORAL | Status: DC
  Administered 2017-10-30 – 2017-11-07 (×17): 850 mg via ORAL
  Filled 2017-10-30: qty 1
  Filled 2017-10-30: qty 14
  Filled 2017-10-30 (×2): qty 1
  Filled 2017-10-30: qty 14
  Filled 2017-10-30 (×12): qty 1
  Filled 2017-10-30: qty 14
  Filled 2017-10-30 (×2): qty 1
  Filled 2017-10-30: qty 14
  Filled 2017-10-30: qty 1
  Filled 2017-10-30 (×2): qty 14

## 2017-10-30 MED ORDER — HYDROCHLOROTHIAZIDE 25 MG PO TABS
25.0000 mg | ORAL_TABLET | Freq: Every day | ORAL | Status: DC
  Administered 2017-10-30 – 2017-11-07 (×9): 25 mg via ORAL
  Filled 2017-10-30: qty 1
  Filled 2017-10-30: qty 7
  Filled 2017-10-30 (×6): qty 1
  Filled 2017-10-30 (×2): qty 7
  Filled 2017-10-30 (×2): qty 1

## 2017-10-30 NOTE — Progress Notes (Signed)
D Patient has spent most of the morning laying in her bed-in her room. She endorses a flat, depressed affect. She is irritable . She says " I think I have a UTI, can we check my urine?". A She completed her daily assessment and on it she wrote she has experienced SI today ( but she contracts with this Clinical research associatewriter to not hurt herslef). She rates her depression, hopelessness and anxiety " 07/13/09", respectively. She talks about her feelings of worthlessness, hopelessness and futility of " starting all over". R RN offered pt encouragement and listened topt ventilate. Safety si in place.

## 2017-10-30 NOTE — [facility] Suicide Risk Assessment (Signed)
[facility] INPATIENT:  Family/Significant Other Suicide Prevention Education  Suicide Prevention Education:  Patient Refusal for Family/Significant Other Suicide Prevention Education: The patient Claudia Erickson has refused to provide written consent for family/significant other to be provided Family/Significant Other Suicide Prevention Education during admission and/or prior to discharge.  Physician notified.  Carloyn JaegerMareida J Grossman-Orr 10/30/2017, 3:49 PM
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
## 2017-10-30 NOTE — Progress Notes (Addendum)
Patient ID: Glori Bickersllison B Oka, female   DOB: 03/14/1966, 52 y.o.   MRN: [id number]  Pt currently presents with an irritable affect and depressed behavior. Pt reports ongoing fatigue and agitation. Writer and provider reviewed patients admission orders. Positive MRSA swab collected on 10/28/2017. Pt currently using Bactroban in nares and using CHG wipes every morning for 2 more days. Per infection prevention, pt is to be a DNA and we are to encourage proper hand hygiene and limited touching of nose while in common areas. Contact precautions is not necessary. Pt reports good sleep with current medication regimen.   Pt provided with medications per providers orders. Pt's labs and vitals were monitored throughout the night. Pt given a 1:1 about emotional and mental status. Pt supported and encouraged to express concerns and questions. Pt educated on medications, proper hand hygiene and MRSA pathophysiology.   Pt's safety ensured with 15 minute and environmental checks. Pt currently denies SI/HI and A/V hallucinations. Pt verbally agrees to seek staff if SI/HI or A/VH occurs and to consult with staff before acting on any harmful thoughts. Pt adheres to medication regimen. Placed on 300 hall due to bed availability but wishes to attend groups on 400 hall, encouraged to speak to MD. Will continue POC.

## 2017-10-30 NOTE — [facility] Group Notes (Signed)
[facility]  Date/Time:  10/30/2017  10:00-11:00AM  Type of Therapy and Topic:  Group Therapy:  Music and Mood  Participation Level:  Active   Description of Group: In this process group, members listened to a variety of genres of music and identified that different types of music evoke different responses.  Patients were encouraged to identify music that was soothing for them and music that was energizing for them.  Patients discussed how this knowledge can help with wellness and recovery in various ways including managing depression and anxiety as well as encouraging healthy sleep habits.    Therapeutic Goals: 1. Patients will explore the impact of different varieties of music on mood 2. Patients will verbalize the thoughts they have when listening to different types of music 3. Patients will identify music that is soothing to them as well as music that is energizing to them 4. Patients will discuss how to use this knowledge to assist in maintaining wellness and recovery 5. Patients will explore the use of music as a coping skill  Summary of Patient Progress:  At the beginning of group, patient expressed feeling irritated, and at one point in group said she was becoming anxious; however, at the end of group she was relaxed and said she felt better.  She kept up with the worksheet given throughout group.  Therapeutic Modalities: Solution Focused Brief Therapy Activity   Ambrose MantleMareida Grossman-Orr, LCSW

## 2017-10-30 NOTE — [facility] Suicide Risk Assessment (Signed)
[facility]   Nursing information obtained from:  Patient Demographic factors:  Caucasian, Living alone, Unemployed, Divorced or widowed Current Mental Status:  Suicidal ideation indicated by patient, Self-harm behaviors Loss Factors:  Decline in physical health, Financial problems / change in socioeconomic status, Loss of significant relationship Historical Factors:  Prior suicide attempts, Family history of mental illness or substance abuse Risk Reduction Factors:  NA  Total Time spent with patient: 45 minutes Principal Problem:  MDD, Severe, No Psychotic Features, S/P Suicide Attempt Diagnosis:   Patient Active Problem List   Diagnosis Date Noted  . MDD (major depressive disorder) [F32.9] 10/29/2017  . Overdose [T50.901A] 10/27/2017  . Diabetic neuropathy (HCC) [E11.40] 06/09/2017  . Cocaine abuse (HCC) [F14.10] 03/16/2017  . Tobacco abuse [Z72.0] 02/02/2017  . Panic attack [F41.0]   . Chronic venous insufficiency [I87.2] 10/28/2016  . Hyponatremia [E87.1] 10/28/2016  . Hyperglycemia [R73.9]   . Intentional drug overdose (HCC) [T50.902A]   . Suicidal ideation [R45.851]   . Urinary frequency [R35.0] 04/08/2015  . Anxiety and depression [F41.9, F32.9] 12/26/2014  . Insomnia [G47.00] 12/26/2014  . Severe episode of recurrent major depressive disorder, without psychotic features (HCC) [F33.2] 08/27/2014  . Aspiration into airway [T17.908A] 12/06/2013  . Aspiration pneumonia (HCC) [J69.0] 12/06/2013  . Polysubstance abuse (HCC) [F19.10] 12/06/2013  . Diabetes mellitus (HCC) [E11.9] 12/06/2013  . Syncope [R55] 12/06/2013  . Leucocytosis [D72.829] 12/06/2013  . Acute respiratory failure (HCC) [J96.00] 12/06/2013  . Elevated LFTs [R94.5] 12/06/2013    Continued Clinical Symptoms:  Alcohol Use Disorder Identification Test Final Score (AUDIT): 1 The "Alcohol Use Disorders Identification Test", Guidelines for Use in Primary Care, Second Edition.  World  [employer] [facility]Health [facility](WHO). Score between 0-7:  no or low risk or alcohol related problems. Score between 8-15:  moderate risk of alcohol related problems. Score between 16-19:  high risk of alcohol related problems. Score 20 or above:  warrants further diagnostic evaluation for alcohol dependence and treatment.   CLINICAL FACTORS:  52 year old female, status post serious , planned suicide attempt by overdosing on Klonopin and on Fentanyl. Required medical admission initially. Reports long history of depression, anxiety and prior suicidal attempts .  Psychiatric Specialty Exam: Physical Exam  ROS  Blood pressure (!) 142/100, pulse 86, temperature 98.1 F (36.7 C), temperature source Oral, resp. rate 16, height 5\' 9"  (1.753 m), weight 130.2 kg (287 lb).Body mass index is 42.38 kg/m.  See admit note MSE                                                         COGNITIVE FEATURES THAT CONTRIBUTE TO RISK:  Closed-mindedness and Loss of executive function    SUICIDE RISK:   Moderate:  Frequent suicidal ideation with limited intensity, and duration, some specificity in terms of plans, no associated intent, good self-control, limited dysphoria/symptomatology, some risk factors present, and identifiable protective factors, including available and accessible social support.  PLAN OF CARE: Patient will be admitted to inpatient psychiatric unit for stabilization and safety. Will provide and encourage milieu participation. Provide medication management and maked adjustments as needed.  Will follow daily.    I certify that inpatient services furnished can reasonably be expected to improve the patient's condition.   Craige CottaFernando A Cobos, MD 10/30/2017, 6:03 PM

## 2017-10-31 DIAGNOSIS — R450 Nervousness: Secondary | ICD-10-CM

## 2017-10-31 DIAGNOSIS — F419 Anxiety disorder, unspecified: Secondary | ICD-10-CM

## 2017-10-31 DIAGNOSIS — Z813 Family history of other psychoactive substance abuse and dependence: Secondary | ICD-10-CM

## 2017-10-31 DIAGNOSIS — F1490 Cocaine use, unspecified, uncomplicated: Secondary | ICD-10-CM

## 2017-10-31 DIAGNOSIS — M549 Dorsalgia, unspecified: Secondary | ICD-10-CM

## 2017-10-31 DIAGNOSIS — F17210 Nicotine dependence, cigarettes, uncomplicated: Secondary | ICD-10-CM

## 2017-10-31 DIAGNOSIS — F39 Unspecified mood [affective] disorder: Secondary | ICD-10-CM

## 2017-10-31 DIAGNOSIS — G4700 Insomnia, unspecified: Secondary | ICD-10-CM

## 2017-10-31 LAB — GLUCOSE, CAPILLARY
GLUCOSE-CAPILLARY: 162 mg/dL — AB (ref 65–99)
GLUCOSE-CAPILLARY: 282 mg/dL — AB (ref 65–99)
GLUCOSE-CAPILLARY: 226 mg/dL — AB (ref 65–99)
Glucose-Capillary: 195 mg/dL — ABNORMAL HIGH (ref 65–99)

## 2017-10-31 MED ORDER — CYCLOBENZAPRINE HCL 5 MG PO TABS: 7.5000 mg | ORAL_TABLET | Freq: Two times a day (BID) | ORAL | Status: DC | PRN

## 2017-10-31 MED ORDER — CYCLOBENZAPRINE HCL 10 MG PO TABS
5.0000 mg | ORAL_TABLET | Freq: Two times a day (BID) | ORAL | Status: DC | PRN
  Administered 2017-10-31 – 2017-11-07 (×12): 5 mg via ORAL
  Filled 2017-10-31 (×11): qty 1

## 2017-10-31 MED ORDER — LIDOCAINE 5 % EX PTCH
1.0000 | MEDICATED_PATCH | Freq: Every day | CUTANEOUS | Status: DC
  Administered 2017-10-31 – 2017-11-03 (×4): 1 via TRANSDERMAL
  Filled 2017-10-31 (×10): qty 1

## 2017-10-31 MED ORDER — CYCLOBENZAPRINE HCL 5 MG PO TABS: 7.5000 mg | ORAL_TABLET | Freq: Three times a day (TID) | ORAL | Status: DC | PRN

## 2017-10-31 NOTE — Progress Notes (Signed)
Patient did not attend AA group meeting tonight.  

## 2017-10-31 NOTE — Progress Notes (Signed)
  Adult Psychoeducational Group Note  Date:  10/31/2017 Time:  4:42 PM  Group Topic/Focus:  BINGO/Relaxation Activity   Participation Level:  Active  Participation Quality:  Appropriate  Affect:  Appropriate  Cognitive:  Appropriate  Insight: Appropriate  Engagement in Group:  Engaged  Modes of Intervention:  Activity  Additional Comments:    Larsen Dungan L 10/31/2017, 4:42 PM  

## 2017-10-31 NOTE — Tx Team (Signed)
Interdisciplinary Treatment and Diagnostic Plan Update  10/31/2017 Time of Session: 0830AM Claudia Erickson MRN: [id number]  Principal Diagnosis: MDD  Secondary Diagnoses: Active Problems:   MDD (major depressive disorder)   Current Medications:  Current Facility-Administered Medications  Medication Dose Route Frequency Provider Last Rate Last Dose  . acetaminophen (TYLENOL) tablet 650 mg  650 mg Oral Q6H PRN Okonkwo, Justina A, NP      . alum & mag hydroxide-simeth (MAALOX/MYLANTA) 200-200-20 MG/5ML suspension 30 mL  30 mL Oral Q4H PRN Okonkwo, Justina A, NP      . DULoxetine (CYMBALTA) DR capsule 30 mg  30 mg Oral Daily Cobos, Rockey Situ, MD   30 mg at 10/31/17 0807  . gabapentin (NEURONTIN) capsule 400 mg  400 mg Oral TID Nira Conn A, NP   400 mg at 10/31/17 0807  . hydrochlorothiazide (HYDRODIURIL) tablet 25 mg  25 mg Oral Daily Nira Conn A, NP   25 mg at 10/31/17 4098  . hydrOXYzine (ATARAX/VISTARIL) tablet 25 mg  25 mg Oral TID PRN Ferne Reus A, NP   25 mg at 10/31/17 0122  . insulin aspart (novoLOG) injection 0-20 Units  0-20 Units Subcutaneous TID WC Nira Conn A, NP   4 Units at 10/31/17 [phone]  . insulin aspart (novoLOG) injection 0-5 Units  0-5 Units Subcutaneous QHS Nira Conn A, NP   3 Units at 10/30/17 2217  . insulin aspart (novoLOG) injection 4 Units  4 Units Subcutaneous TID WC Okonkwo, Justina A, NP   4 Units at 10/31/17 4782  . insulin glargine (LANTUS) injection 20 Units  20 Units Subcutaneous QHS Okonkwo, Justina A, NP   20 Units at 10/30/17 2215  . magnesium hydroxide (MILK OF MAGNESIA) suspension 30 mL  30 mL Oral Daily PRN Okonkwo, Justina A, NP      . metFORMIN (GLUCOPHAGE) tablet 850 mg  850 mg Oral BID WC Nira Conn A, NP   850 mg at 10/31/17 0807  . mirtazapine (REMERON) tablet 15 mg  15 mg Oral QHS Okonkwo, Justina A, NP   15 mg at 10/30/17 2215  . mupirocin ointment (BACTROBAN) 2 % 1 application  1 application Nasal BID Beryle Lathe, Justina A, NP    1 application at 10/31/17 0807  . nicotine (NICODERM CQ - dosed in mg/24 hours) patch 21 mg  21 mg Transdermal Daily Nira Conn A, NP   21 mg at 10/31/17 0807  . pantoprazole (PROTONIX) EC tablet 40 mg  40 mg Oral Daily Nira Conn A, NP   40 mg at 10/31/17 9562   PTA Medications: Medications Prior to Admission  Medication Sig Dispense Refill Last Dose  . citalopram (CELEXA) 40 MG tablet Take 1 tablet (40 mg total) by mouth daily. For depression 30 tablet 11 10/24/2017  . gabapentin (NEURONTIN) 400 MG capsule Take 1 capsule (400 mg total) by mouth 3 (three) times daily. Increase by 1 every 3 days as tolerated until taking 2 three times a day.For agitation/Neuropathic pain 180 capsule 1 unknown  . hydrochlorothiazide (HYDRODIURIL) 25 MG tablet Take 1 tablet (25 mg total) by mouth daily. For high blood pressure 30 tablet 1 10/25/2017  . insulin aspart protamine- aspart (NOVOLOG MIX 70/30) (70-30) 100 UNIT/ML injection Inject 40 units into the skin with breakfast & 35 units in to the Skin at supper: For diabetes management (Patient taking differently: Inject 40 units into the skin with breakfast & 30 units in to the Skin at supper: For diabetes management) 10 mL 1 unknown  .  LORazepam (ATIVAN) 1 MG tablet 1/2-1 tablet by mouth as needed 10 tablet 0 Past Week at Unknown time  . metFORMIN (GLUCOPHAGE) 850 MG tablet Take 1 tablet (850 mg total) by mouth 2 (two) times daily with a meal. For diabetes management 60 tablet 0 10/25/2017  . mirtazapine (REMERON) 15 MG tablet Take 1 tablet (15 mg total) by mouth at bedtime. For depression/sleep 30 tablet 11 10/25/2017  . nicotine (NICODERM CQ - DOSED IN MG/24 HOURS) 21 mg/24hr patch Place 1 patch (21 mg total) onto the skin daily. For smoking cessation (Patient not taking: Reported on 04/13/2017) 28 patch 0 Not Taking at Unknown time  . omeprazole (PRILOSEC OTC) 20 MG tablet Take 1 tablet (20 mg total) by mouth daily. For acid reflux 30 tablet 0 10/26/2017 at Unknown  time    Patient Stressors: Financial difficulties Health problems Marital or family conflict Medication change or noncompliance Substance abuse  Patient Strengths: DentistCommunication skills General fund of knowledge Motivation for treatment/growth  Treatment Modalities: Medication Management, Group therapy, Case management,  1 to 1 session with clinician, Psychoeducation, Recreational therapy.   Physician Treatment Plan for Primary Diagnosis: MDD Long Term Goal(s): Improvement in symptoms so as ready for discharge Improvement in symptoms so as ready for discharge   Short Term Goals: Ability to identify changes in lifestyle to reduce recurrence of condition will improve Ability to verbalize feelings will improve Ability to disclose and discuss suicidal ideas Ability to demonstrate self-control will improve Ability to identify and develop effective coping behaviors will improve Ability to maintain clinical measurements within normal limits will improve Compliance with prescribed medications will improve Ability to verbalize feelings will improve Ability to disclose and discuss suicidal ideas Ability to demonstrate self-control will improve Ability to identify and develop effective coping behaviors will improve  Medication Management: Evaluate patient's response, side effects, and tolerance of medication regimen.  Therapeutic Interventions: 1 to 1 sessions, Unit Group sessions and Medication administration.  Evaluation of Outcomes: Progressing  Physician Treatment Plan for Secondary Diagnosis: Active Problems:   MDD (major depressive disorder)  Long Term Goal(s): Improvement in symptoms so as ready for discharge Improvement in symptoms so as ready for discharge   Short Term Goals: Ability to identify changes in lifestyle to reduce recurrence of condition will improve Ability to verbalize feelings will improve Ability to disclose and discuss suicidal ideas Ability to  demonstrate self-control will improve Ability to identify and develop effective coping behaviors will improve Ability to maintain clinical measurements within normal limits will improve Compliance with prescribed medications will improve Ability to verbalize feelings will improve Ability to disclose and discuss suicidal ideas Ability to demonstrate self-control will improve Ability to identify and develop effective coping behaviors will improve     Medication Management: Evaluate patient's response, side effects, and tolerance of medication regimen.  Therapeutic Interventions: 1 to 1 sessions, Unit Group sessions and Medication administration.  Evaluation of Outcomes: Progressing   RN Treatment Plan for Primary Diagnosis: MDD Long Term Goal(s): Knowledge of disease and therapeutic regimen to maintain health will improve  Short Term Goals: Ability to remain free from injury will improve, Ability to disclose and discuss suicidal ideas and Ability to identify and develop effective coping behaviors will improve  Medication Management: RN will administer medications as ordered by provider, will assess and evaluate patient's response and provide education to patient for prescribed medication. RN will report any adverse and/or side effects to prescribing provider.  Therapeutic Interventions: 1 on 1 counseling sessions, Psychoeducation,  Medication administration, Evaluate responses to treatment, Monitor vital signs and CBGs as ordered, Perform/monitor CIWA, COWS, AIMS and Fall Risk screenings as ordered, Perform wound care treatments as ordered.  Evaluation of Outcomes: Progressing   LCSW Treatment Plan for Primary Diagnosis:MDD Long Term Goal(s): Safe transition to appropriate next level of care at discharge, Engage patient in therapeutic group addressing interpersonal concerns.  Short Term Goals: Engage patient in aftercare planning with referrals and resources, Facilitate patient  progression through stages of change regarding substance use diagnoses and concerns and Identify triggers associated with mental health/substance abuse issues  Therapeutic Interventions: Assess for all discharge needs, 1 to 1 time with Social worker, Explore available resources and support systems, Assess for adequacy in community support network, Educate family and significant other(s) on suicide prevention, Complete Psychosocial Assessment, Interpersonal group therapy.  Evaluation of Outcomes: Progressing   Progress in Treatment: Attending groups: Yes. Participating in groups: Yes. Taking medication as prescribed: Yes. Toleration medication: Yes. Family/Significant other contact made: SPE completed with pt; pt declined to consent to family contact.  Patient understands diagnosis: Yes. Discussing patient identified problems/goals with staff: Yes. Medical problems stabilized or resolved: Yes. Denies suicidal/homicidal ideation: Yes. Issues/concerns per patient self-inventory: No. Other: n/a   New problem(s) identified: No, Describe:  n/a  New Short Term/Long Term Goal(s): detox, medication management for mood stabilization; elimination of SI thoughts; development of comprehensive mental wellness/sobriety plan.    Discharge Plan or Barriers: CSW assessing for appropriate referrals. Pt is thinking about living with her father or returning to shelter. Possibly rehab if medically stable--[facility]?   Reason for Continuation of Hospitalization: Anxiety Depression Medication stabilization Suicidal ideation Withdrawal symptoms  Estimated Length of Stay: Wed, 11/02/17  Attendees: Patient: 10/31/2017 9:15 AM  Physician: Dr. Jama Flavors MD; Dr. Altamese Lancaster MD 10/31/2017 9:15 AM  Nursing: Foy Guadalajara RN; PatriceRN 10/31/2017 9:15 AM  RN Care Manager: Onnie Boer CM 10/31/2017 9:15 AM  Social Worker: Chartered loss adjuster, LCSW 10/31/2017 9:15 AM  Recreational Therapist: x 10/31/2017 9:15 AM  Other: Armandina Stammer NP;  Hillery Jacks NP 10/31/2017 9:15 AM  Other:  10/31/2017 9:15 AM  Other: 10/31/2017 9:15 AM    Scribe for Treatment Team: Ledell Peoples Smart, LCSW 10/31/2017 9:15 AM

## 2017-10-31 NOTE — Tx Team (Deleted)
Initial Treatment Plan 10/31/2017 1:28 AM Glori BickersAllison B Weakland ZOX:[id number]    PATIENT STRESSORS: Educational concerns Financial difficulties Medication change or noncompliance   PATIENT STRENGTHS: Ability for insight Average or above average intelligence Capable of independent living Communication skills General fund of knowledge Motivation for treatment/growth Supportive family/friends   PATIENT IDENTIFIED PROBLEMS: depression  anxiety  Risk for suicide  A/V hallucination  "I want to get relive from this crisis"             DISCHARGE CRITERIA:  Improved stabilization in mood, thinking, and/or behavior Motivation to continue treatment in a less acute level of care Need for constant or close observation no longer present Reduction of life-threatening or endangering symptoms to within safe limits Verbal commitment to aftercare and medication compliance  PRELIMINARY DISCHARGE PLAN: Attend aftercare/continuing care group Outpatient therapy Return to previous living arrangement Return to previous work or school arrangements  PATIENT/FAMILY INVOLVEMENT: This treatment plan has been presented to and reviewed with the patient, Glori Bickersllison B Hazen,   The patient and family have been given the opportunity to ask questions and make suggestions.  JEHU-APPIAH, Netty Sullivant K, RN 10/31/2017, 1:28 AM

## 2017-10-31 NOTE — Progress Notes (Signed)
Pt presents with a flat affect and depressed mood. Pt irritable on approach and noted to be easily agitated. Pt reports ongoing depression and anxiety this am. Pt reports poor sleep last night. Pt denies SI since her last suicide attempt prior to admit. Pt denies any withdrawal symptoms. Pt noted to have minimal interaction on the unit and is isolative to her room. Medications reviewed with pt. Medications administered as ordered per MD. Verbal support provided. 15 minute checks performed for safety.

## 2017-10-31 NOTE — [facility] Group Notes (Signed)
[facility] Group Therapy 10/31/2017 1:15pm  Type of Therapy: [facility] [employer] Presentation  Participation Level: Active  Participation Quality: Attentive  Affect: Appropriate  Cognitive: Oriented  Insight: Developing/Improving  Engagement in Therapy: Engaged  Modes of Intervention: Discussion, Education and Socialization  Summary of Progress/Problems: [employer] ([facility]) Speaker came to talk about his personal journey with mental health. The pt processed ways by which to relate to the speaker. [facility] speaker provided handouts and educational information pertaining to groups and services offered by the [employer]. Pt was engaged in speaker's presentation and was receptive to resources provided.    Ludmilla Mcgillis N Smart, LCSW 10/31/2017 2:03 PM  

## 2017-10-31 NOTE — Plan of Care (Signed)
  Progressing Medication: Compliance with prescribed medication regimen will improve 10/31/2017 1541 - Progressing by Layla BarterWhite, Emme Rosenau L, RN Activity: Interest or engagement in leisure activities will improve 10/31/2017 1541 - Progressing by Layla BarterWhite, Santo Zahradnik L, RN

## 2017-10-31 NOTE — Progress Notes (Signed)
D: Writer overheard pt talking to her father on the telephone. She was attempting to give him the address and instructing him on which clothes to bring to the hosp. [name] appeared to be having difficulty understanding because he could be heard asking questions and becoming upset. Pt became tearful after her phone conversation with her father. Informed the writer that he suffers from alzheimer and "wouldn;t be able to find his way here". Writer asked pt if there was anyone else that could bring her something and she stated, "I don't have nobody".  Pt has no other questions or concerns.    A:  Support and encouragement was offered. 15 min checks continued for safety.  R: Pt remains safe.

## 2017-10-31 NOTE — Progress Notes (Signed)
[facility] MD Progress Note  10/31/2017 11:24 AM Claudia Erickson  MRN:  [id number]   Subjective:  Claudia Erickson reports " I am having back pain today and its not helping my depression"  Objective: [name] is awake, alert and oriented. Seen resting in bed, holding lower back. Reports chronic lower back pain and states that the bed is making the pain worse. Patient presents with a flat and guarded affect. Discussed initiating  flexeril PRN and Lidoderm patch. Patient reports taken Remeron  And Cymbalta as prescribed and states she is tolerating medications  well.   Denies suicidal or homicidal ideation during this assessment  Denies auditory or visual hallucination and does not appear to be responding to internal stimuli.  Rates her depression 10/10 today. Support, encouragement and reassurance was provided.    Principal Problem: <principal problem not specified> Diagnosis:   Patient Active Problem List   Diagnosis Date Noted  . MDD (major depressive disorder) [F32.9] 10/29/2017  . Overdose [T50.901A] 10/27/2017  . Diabetic neuropathy (HCC) [E11.40] 06/09/2017  . Cocaine abuse (HCC) [F14.10] 03/16/2017  . Tobacco abuse [Z72.0] 02/02/2017  . Panic attack [F41.0]   . Chronic venous insufficiency [I87.2] 10/28/2016  . Hyponatremia [E87.1] 10/28/2016  . Hyperglycemia [R73.9]   . Intentional drug overdose (HCC) [T50.902A]   . Suicidal ideation [R45.851]   . Urinary frequency [R35.0] 04/08/2015  . Anxiety and depression [F41.9, F32.9] 12/26/2014  . Insomnia [G47.00] 12/26/2014  . Severe episode of recurrent major depressive disorder, without psychotic features (HCC) [F33.2] 08/27/2014  . Aspiration into airway [T17.908A] 12/06/2013  . Aspiration pneumonia (HCC) [J69.0] 12/06/2013  . Polysubstance abuse (HCC) [F19.10] 12/06/2013  . Diabetes mellitus (HCC) [E11.9] 12/06/2013  . Syncope [R55] 12/06/2013  . Leucocytosis [D72.829] 12/06/2013  . Acute respiratory failure (HCC) [J96.00] 12/06/2013  .  Elevated LFTs [R94.5] 12/06/2013   Total Time spent with patient: 20 minutes  Past Psychiatric History:   Past Medical History:  Past Medical History:  Diagnosis Date  . Anxiety and depression   . Chronic back pain   . Diabetes mellitus without complication (HCC)   . Diabetic neuropathy (HCC) 06/09/2017  . GERD (gastroesophageal reflux disease)   . Hypertension   . Panic attack 1990s  . Peripheral neuropathy   . Positive TB test   . Substance abuse (HCC)   . Substance induced mood disorder (HCC)   . UTI (lower urinary tract infection)     Past Surgical History:  Procedure Laterality Date  . ABDOMINAL HYSTERECTOMY    . CHOLECYSTECTOMY    . ROTATOR CUFF REPAIR    . TONSILLECTOMY     Family History:  Family History  Problem Relation Age of Onset  . Diabetes Mellitus II Father   . Hyperlipidemia Father   . Diabetes Father   . Colon cancer Father 25       not sure of age he was diagnosed  . Arthritis Mother   . Hypertension Mother   . Heart disease Mother   . Drug abuse Son        incarcerated with drug issues  . CAD Neg Hx    Family Psychiatric  History:  Social History:  Social History   Substance and Sexual Activity  Alcohol Use No     Social History   Substance and Sexual Activity  Drug Use Yes  . Types: Cocaine   Comment: Heroine--only used briefly before.    Social History   Socioeconomic History  . Marital status: Single  Spouse name: None  . Number of children: 2  . Years of education: 66  . Highest education level: None  Social Needs  . Financial resource strain: None  . Food insecurity - worry: None  . Food insecurity - inability: None  . Transportation needs - medical: None  . Transportation needs - non-medical: None  Occupational History  . 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 Smoking status: Current Every Day Smoker    Packs/day: 0.50    Years: 30.00    Pack years: 15.00     Types: Cigarettes  . Smokeless tobacco: Never Used  Substance and Sexual Activity  . Alcohol use: No  . Drug use: Yes    Types: Cocaine    Comment: Heroine--only used briefly before.  Marland Kitchen Sexual activity: No  Other Topics Concern  . None  Social History Narrative   Born and raised in [place], [place].   Fun: Doesn't do a thing - never leaves the house.   Denies religious beliefs affecting health care.    Recently moved in with father for caregiver purposes   Not in touch with older son.   Younger son incarcerated for drug related reasons.   Additional Social History:                         Sleep: Fair  Appetite:  Fair  Current Medications: Current Facility-Administered Medications  Medication Dose Route Frequency Provider Last Rate Last Dose  . acetaminophen (TYLENOL) tablet 650 mg  650 mg Oral Q6H PRN Okonkwo, Justina A, NP      . alum & mag hydroxide-simeth (MAALOX/MYLANTA) 200-200-20 MG/5ML suspension 30 mL  30 mL Oral Q4H PRN Okonkwo, Justina A, NP      . cyclobenzaprine (FLEXERIL) tablet 7.5 mg  7.5 mg Oral BID PRN Oneta Rack, NP      . DULoxetine (CYMBALTA) DR capsule 30 mg  30 mg Oral Daily Ryah Cribb, Rockey Situ, MD   30 mg at 10/31/17 0807  . gabapentin (NEURONTIN) capsule 400 mg  400 mg Oral TID Nira Conn A, NP   400 mg at 10/31/17 0807  . hydrochlorothiazide (HYDRODIURIL) tablet 25 mg  25 mg Oral Daily Nira Conn A, NP   25 mg at 10/31/17 1610  . hydrOXYzine (ATARAX/VISTARIL) tablet 25 mg  25 mg Oral TID PRN Ferne Reus A, NP   25 mg at 10/31/17 0122  . insulin aspart (novoLOG) injection 0-20 Units  0-20 Units Subcutaneous TID WC Nira Conn A, NP   4 Units at 10/31/17 [id number]  . insulin aspart (novoLOG) injection 0-5 Units  0-5 Units Subcutaneous QHS Nira Conn A, NP   3 Units at 10/30/17 2217  . insulin aspart (novoLOG) injection 4 Units  4 Units Subcutaneous TID WC Okonkwo, Justina A, NP   4 Units at 10/31/17 [id number]  . insulin glargine (LANTUS)  injection 20 Units  20 Units Subcutaneous QHS Okonkwo, Justina A, NP   20 Units at 10/30/17 2215  . lidocaine (LIDODERM) 5 % 1 patch  1 patch Transdermal Daily Lewis, Tanika N, NP      . magnesium hydroxide (MILK OF MAGNESIA) suspension 30 mL  30 mL Oral Daily PRN Okonkwo, Justina A, NP      . metFORMIN (GLUCOPHAGE) tablet 850 mg  850 mg Oral BID WC Nira Conn A, NP   850 mg at 10/31/17 0807  . mirtazapine (REMERON) tablet  15 mg  15 mg Oral QHS Okonkwo, Justina A, NP   15 mg at 10/30/17 2215  . mupirocin ointment (BACTROBAN) 2 % 1 application  1 application Nasal BID Beryle Lathekonkwo, Justina A, NP   1 application at 10/31/17 0807  . nicotine (NICODERM CQ - dosed in mg/24 hours) patch 21 mg  21 mg Transdermal Daily Nira ConnBerry, Jason A, NP   21 mg at 10/31/17 0807  . pantoprazole (PROTONIX) EC tablet 40 mg  40 mg Oral Daily Nira ConnBerry, Jason A, NP   40 mg at 10/31/17 [id number]    Lab Results:  Results for orders placed or performed during the hospital encounter of 10/29/17 (from the past 48 hour(s))  Glucose, capillary     Status: Abnormal   Collection Time: 10/29/17  5:05 PM  Result Value Ref Range   Glucose-Capillary 256 (H) 65 - 99 mg/dL   Comment 1 Notify RN    Comment 2 Document in Chart   Glucose, capillary     Status: Abnormal   Collection Time: 10/29/17  9:39 PM  Result Value Ref Range   Glucose-Capillary 264 (H) 65 - 99 mg/dL  Glucose, capillary     Status: Abnormal   Collection Time: 10/30/17  6:03 AM  Result Value Ref Range   Glucose-Capillary 226 (H) 65 - 99 mg/dL  Urinalysis, Routine w reflex microscopic     Status: Abnormal   Collection Time: 10/30/17 11:15 AM  Result Value Ref Range   Color, Urine STRAW (A) YELLOW   APPearance CLEAR CLEAR   Specific Gravity, Urine 1.017 1.005 - 1.030   pH 6.0 5.0 - 8.0   Glucose, UA >=500 (A) NEGATIVE mg/dL   Hgb urine dipstick SMALL (A) NEGATIVE   Bilirubin Urine NEGATIVE NEGATIVE   Ketones, ur NEGATIVE NEGATIVE mg/dL   Protein, ur NEGATIVE NEGATIVE  mg/dL   Nitrite NEGATIVE NEGATIVE   Leukocytes, UA LARGE (A) NEGATIVE   RBC / HPF 6-30 0 - 5 RBC/hpf   WBC, UA TOO NUMEROUS TO COUNT 0 - 5 WBC/hpf   Bacteria, UA RARE (A) NONE SEEN   Squamous Epithelial / LPF 0-5 (A) NONE SEEN   WBC Clumps PRESENT    Mucus PRESENT     Comment: Performed at [facility], [address] [place], [place] [id number]  Glucose, capillary     Status: Abnormal   Collection Time: 10/30/17 12:02 PM  Result Value Ref Range   Glucose-Capillary 322 (H) 65 - 99 mg/dL  Glucose, capillary     Status: Abnormal   Collection Time: 10/30/17  4:57 PM  Result Value Ref Range   Glucose-Capillary 238 (H) 65 - 99 mg/dL   Comment 1 Notify RN   Glucose, capillary     Status: Abnormal   Collection Time: 10/30/17  9:14 PM  Result Value Ref Range   Glucose-Capillary 260 (H) 65 - 99 mg/dL   Comment 1 Notify RN    Comment 2 Document in Chart   Glucose, capillary     Status: Abnormal   Collection Time: 10/31/17  5:53 AM  Result Value Ref Range   Glucose-Capillary 162 (H) 65 - 99 mg/dL   Comment 1 Notify RN    Comment 2 Document in Chart     Blood Alcohol level:  Lab Results  Component Value Date   ETH <10 10/27/2017   ETH <5 03/15/2017    Metabolic Disorder Labs: Lab Results  Component Value Date   HGBA1C 10.0 (H) 03/21/2017   MPG  240 03/21/2017   MPG 298 10/29/2016   No results found for: PROLACTIN No results found for: CHOL, TRIG, HDL, CHOLHDL, VLDL, LDLCALC  Physical Findings: AIMS: Facial and Oral Movements Muscles of Facial Expression: None, normal Lips and Perioral Area: None, normal Jaw: None, normal Tongue: None, normal,Extremity Movements Upper (arms, wrists, hands, fingers): None, normal Lower (legs, knees, ankles, toes): None, normal, Trunk Movements Neck, shoulders, hips: None, normal, Overall Severity Severity of abnormal movements (highest score from questions above): None, normal Incapacitation due to abnormal movements:  None, normal Patient's awareness of abnormal movements (rate only patient's report): No Awareness, Dental Status Current problems with teeth and/or dentures?: No Does patient usually wear dentures?: No  CIWA:    COWS:     Musculoskeletal: Strength & Muscle Tone: within normal limits Gait & Station: normal Patient leans: N/A  Psychiatric Specialty Exam: Physical Exam  Vitals reviewed. Constitutional: She appears well-developed.  Cardiovascular: Normal rate.  Neurological: She is alert.  Psychiatric: She has a normal mood and affect.    Review of Systems  Musculoskeletal: Positive for back pain.  Psychiatric/Behavioral: Positive for depression and substance abuse. Negative for suicidal ideas. The patient is nervous/anxious and has insomnia.   All other systems reviewed and are negative.   Blood pressure 109/86, pulse (!) 101, temperature 98.2 F (36.8 C), temperature source Oral, resp. rate 20, height 5\' 9"  (1.753 m), weight 130.2 kg (287 lb).Body mass index is 42.38 kg/m.  General Appearance: Casual  Eye Contact:  Fair  Speech:  Clear and Coherent  Volume:  Normal  Mood:  Anxious and Depressed  Affect:  Depressed, Flat and Labile  Thought Process:  Coherent  Orientation:  Full (Time, Place, and Person)  Thought Content:  Hallucinations: None  Suicidal Thoughts:  No  Homicidal Thoughts:  No  Memory:  Immediate;   Fair Recent;   Fair Remote;   Fair  Judgement:  Fair  Insight:  Fair  Psychomotor Activity:  Normal  Concentration:  Concentration: Fair  Recall:  Good  Fund of Knowledge:  Fair  Language:  Fair  Akathisia:  No  Handed:  Right  AIMS (if indicated):     Assets:  Communication Skills Desire for Improvement Resilience Social Support  ADL's:  Intact  Cognition:  WNL  Sleep:  Number of Hours: 2.75     Treatment Plan Summary: Daily contact with patient to assess and evaluate symptoms and progress in treatment and Medication management   Continue with  treatment plan on 10/31/2017 except wherenoted  Mood stabilization.  Continue Cymbalta 30 mg   Continue Neurontin  400 mg PO TID  Insomnia   Continue Remeron 15 mg   Back pain/discomfort   Initiated flexeril 7.5mg  PO BID PRN  Initiated Lidoderm Patch 5% PRN BID    Will continue to monitor vitals ,medication compliance and treatment side effects while patient is here.  Reviewed labs Glucose 135 elevated ,BAL - , UDS - pos for cocaine TSH- pending  CSW will start working on disposition.  Patient to participate in therapeutic milieu  Oneta Rack, NP 10/31/2017, 11:24 AM   Agree with NP Progress Note

## 2017-10-31 NOTE — Progress Notes (Signed)
D: Pt was in bed upon initial approach.  Pt presents with depressed affect and mood.  Pt denies SI/HI, denies hallucinations.  She denies having a goal for the night so writer and pt made one together for pt to be safe and sleep well.  Pt has stayed in her room for the majority of the evening and she did not attend group tonight.     A: Introduced self to pt.  Actively listened to pt and offered support and encouragement. Medications administered per order.  PRN medication administered for anxiety and muscle spasms.  Q15 minute safety checks maintained.  R: Pt is safe on the unit.  Pt is compliant with medications and she reports she will inform staff of needs and concerns.  Pt verbally contracts for safety.  Will continue to monitor and assess.

## 2017-10-31 NOTE — Progress Notes (Signed)
Recreation Therapy Notes  Date: 10/31/17 Time: 0930 Location: [address] Dayroom  Group Topic: Stress Management  Goal Area(s) Addresses:  Patient will verbalize importance of using healthy stress management.  Patient will identify positive emotions associated with healthy stress management.   Intervention: Stress Management  Activity : Meditation.  LRT played a body scan meditation from the Calm app. The meditation allowed patients to take inventory of any sensations they may or may not have been feeling.  Education:  Stress Management, Discharge Planning.   Education Outcome: Acknowledges edcuation/In group clarification offered/Needs additional education  Clinical Observations/Feedback: Pt did not attend group.     Caroll RancherMarjette Arvis Miguez, LRT/CTRS         Caroll RancherLindsay, Shatasha Lambing A 10/31/2017 12:06 PM

## 2017-11-01 DIAGNOSIS — B379 Candidiasis, unspecified: Secondary | ICD-10-CM

## 2017-11-01 DIAGNOSIS — M791 Myalgia, unspecified site: Secondary | ICD-10-CM

## 2017-11-01 DIAGNOSIS — B373 Candidiasis of vulva and vagina: Secondary | ICD-10-CM | POA: Clinically undetermined

## 2017-11-01 DIAGNOSIS — F322 Major depressive disorder, single episode, severe without psychotic features: Secondary | ICD-10-CM

## 2017-11-01 DIAGNOSIS — B3731 Acute candidiasis of vulva and vagina: Secondary | ICD-10-CM | POA: Clinically undetermined

## 2017-11-01 LAB — GLUCOSE, CAPILLARY
Glucose-Capillary: 208 mg/dL — ABNORMAL HIGH (ref 65–99)
Glucose-Capillary: 216 mg/dL — ABNORMAL HIGH (ref 65–99)
Glucose-Capillary: 240 mg/dL — ABNORMAL HIGH (ref 65–99)
Glucose-Capillary: 202 mg/dL — ABNORMAL HIGH (ref 65–99)

## 2017-11-01 LAB — TSH: TSH: 3.912 u[IU]/mL (ref 0.350–4.500)

## 2017-11-01 MED ORDER — FLUCONAZOLE 50 MG PO TABS
150.0000 mg | ORAL_TABLET | ORAL | Status: AC
  Administered 2017-11-01 – 2017-11-04 (×2): 150 mg via ORAL
  Filled 2017-11-01 (×2): qty 3

## 2017-11-01 MED ORDER — DULOXETINE HCL 60 MG PO CPEP
60.0000 mg | ORAL_CAPSULE | Freq: Every day | ORAL | Status: DC
  Administered 2017-11-02 – 2017-11-03 (×2): 60 mg via ORAL
  Filled 2017-11-01 (×4): qty 1

## 2017-11-01 NOTE — Progress Notes (Signed)
Patient denies SI, HI and AVH.  Patient has attended groups, engaged in unit activity and been compliant with treatment and medications this shift. Patient has had no incidents of behavioral dyscontrol.   Assess patient for safety, offer medications as prescribed, engage patient in 1:1 staff talks.   Patient able to contract for safety.  Continue to monitor as planned.

## 2017-11-01 NOTE — [facility] Group Notes (Signed)
LCSW Group Therapy Note   11/01/2017 1:15pm   Type of Therapy and Topic:  Group Therapy:  Overcoming Obstacles   Participation Level:  Active   Description of Group:    In this group patients will be encouraged to explore what they see as obstacles to their own wellness and recovery. They will be guided to discuss their thoughts, feelings, and behaviors related to these obstacles. The group will process together ways to cope with barriers, with attention given to specific choices patients can make. Each patient will be challenged to identify changes they are motivated to make in order to overcome their obstacles. This group will be process-oriented, with patients participating in exploration of their own experiences as well as giving and receiving support and challenge from other group members.   Therapeutic Goals: 1. Patient will identify personal and current obstacles as they relate to admission. 2. Patient will identify barriers that currently interfere with their wellness or overcoming obstacles.  3. Patient will identify feelings, thought process and behaviors related to these barriers. 4. Patient will identify two changes they are willing to make to overcome these obstacles:      Summary of Patient Progress   Claudia Erickson was attentive and engaged during today's processing group. She shared that she plans to return home at discharge and is relieved to learn that her PCP will accept her back on a sliding scale fee. Patient was provided with an orange card application, as she reported that a major obstacle for her is "affording my health care and my medication." Claudia Erickson continues to show progress in the group setting with improving insight.    Therapeutic Modalities:   Cognitive Behavioral Therapy Solution Focused Therapy Motivational Interviewing Relapse Prevention Therapy  Ledell PeoplesHeather N Smart, LCSW 11/01/2017 3:13 PM

## 2017-11-01 NOTE — [facility] Group Notes (Signed)
[facility] Group Notes:  (Nursing/MHT/Case Management/Adjunct)  Date:  11/01/2017  Time:  4:26 PM  Type of Therapy:  Psychoeducational Skills  Participation Level:  Active  Participation Quality:  Appropriate  Affect:  Appropriate  Cognitive:  Appropriate  Insight:  Appropriate  Engagement in Group:  Engaged  Modes of Intervention:  Activity and Education  Summary of Progress/Problems:  Nurse taught participants how to use deep breathing and short guided imagery sessions, on their own, specifically how these pertain to small time spans and stressful situations, in their every day lives.  [name] [name] 11/01/2017, 4:26 PM

## 2017-11-01 NOTE — Progress Notes (Signed)
Patient ID: Claudia Erickson, female   DOB: 12/07/1965, 52 y.o.   MRN: [id number]  Pt currently presents with a labile affect and cooperative behavior. Pt mood is improving. Pt attends wrap up group tonight. Pt states she is feeling much better than when she arrived. Reports ongoing chronic pain. Pt reports good sleep with current medication regimen.   Pt provided with medications per providers orders. Pt's labs and vitals were monitored throughout the night. Pt given a 1:1 about emotional and mental status. Pt supported and encouraged to express concerns and questions. Pt educated on medications.  Pt's safety ensured with 15 minute and environmental checks. Pt currently denies SI/HI and A/V hallucinations. Pt verbally agrees to seek staff if SI/HI or A/VH occurs and to consult with staff before acting on any harmful thoughts. Will continue POC.

## 2017-11-01 NOTE — Progress Notes (Signed)
Adult Psychoeducational Group Note  Date:  11/01/2017 Time:  6:25 PM  Group Topic/Focus:  Relapse Prevention Planning:   The focus of this group is to define relapse and discuss the need for planning to combat relapse.  Participation Level:  Active  Participation Quality:  Appropriate  Affect:  Appropriate  Cognitive:  Appropriate  Insight: Appropriate  Engagement in Group:  Engaged  Modes of Intervention:  Discussion  Additional Comments:  Pt did attend group and actively participated in defining ways to prevent relapse.  [name], Jalin Erpelding D 11/01/2017, 6:25 PM

## 2017-11-01 NOTE — Progress Notes (Signed)
[facility] MD Progress Note  11/01/2017 2:06 PM Claudia Erickson  MRN:  [id number]   Subjective:  Patient reports that her back is hurting very bad, but the medication that she was prescribed yesterday has helped some. She reports that her vagina feels raw, irritated, and like she has a yeast infection. She also reports some urinary frequency with some hesitation. She denies any SI/HI/AVH and contracts for safety. She also reports that her sleep was good last night and her appetite has been good as well.   Objective: Patient's chart and findings reviewed and discussed with treatment team. Patient remains in her bed and wants to continue the Cymbalta, but will increase her dose to 60 mg Daily. Also reviewed labs and TSH is WNL. She is positive for MRSA on 10/27/17. Will order a UA, prescribe Diflucan 150 mg Q72H for 2 doses. Patient has remained pleasant and communicative with staff and peers. Reviewed labs and TSH is WNL.  Principal Problem: MDD (major depressive disorder), severe (HCC) Diagnosis:   Patient Active Problem List   Diagnosis Date Noted  . Vaginal candidiasis [B37.3] 11/01/2017  . MDD (major depressive disorder), severe (HCC) [F32.2] 10/29/2017  . Overdose [T50.901A] 10/27/2017  . Diabetic neuropathy (HCC) [E11.40] 06/09/2017  . Cocaine abuse (HCC) [F14.10] 03/16/2017  . Tobacco abuse [Z72.0] 02/02/2017  . Panic attack [F41.0]   . Chronic venous insufficiency [I87.2] 10/28/2016  . Hyponatremia [E87.1] 10/28/2016  . Hyperglycemia [R73.9]   . Intentional drug overdose (HCC) [T50.902A]   . Suicidal ideation [R45.851]   . Urinary frequency [R35.0] 04/08/2015  . Anxiety and depression [F41.9, F32.9] 12/26/2014  . Insomnia [G47.00] 12/26/2014  . Severe episode of recurrent major depressive disorder, without psychotic features (HCC) [F33.2] 08/27/2014  . Aspiration into airway [T17.908A] 12/06/2013  . Aspiration pneumonia (HCC) [J69.0] 12/06/2013  . Polysubstance abuse (HCC) [F19.10]  12/06/2013  . Diabetes mellitus (HCC) [E11.9] 12/06/2013  . Syncope [R55] 12/06/2013  . Leucocytosis [D72.829] 12/06/2013  . Acute respiratory failure (HCC) [J96.00] 12/06/2013  . Elevated LFTs [R94.5] 12/06/2013   Total Time spent with patient: 25 miuntes  Past Psychiatric History: See H&P  Past Medical History:  Past Medical History:  Diagnosis Date  . Anxiety and depression   . Chronic back pain   . Diabetes mellitus without complication (HCC)   . Diabetic neuropathy (HCC) 06/09/2017  . GERD (gastroesophageal reflux disease)   . Hypertension   . Panic attack 1990s  . Peripheral neuropathy   . Positive TB test   . Substance abuse (HCC)   . Substance induced mood disorder (HCC)   . UTI (lower urinary tract infection)     Past Surgical History:  Procedure Laterality Date  . ABDOMINAL HYSTERECTOMY    . CHOLECYSTECTOMY    . ROTATOR CUFF REPAIR    . TONSILLECTOMY     Family History:  Family History  Problem Relation Age of Onset  . Diabetes Mellitus II Father   . Hyperlipidemia Father   . Diabetes Father   . Colon cancer Father 22       not sure of age he was diagnosed  . Arthritis Mother   . Hypertension Mother   . Heart disease Mother   . Drug abuse Son        incarcerated with drug issues  . CAD Neg Hx    Family Psychiatric  History: See H&P Social History:  Social History   Substance and Sexual Activity  Alcohol Use No     Social History  Substance and Sexual Activity  Drug Use Yes  . Types: Cocaine   Comment: Heroine--only used briefly before.    Social History   Socioeconomic History  . Marital status: Single    Spouse name: None  . Number of children: 2  . Years of education: 85  . Highest education level: None  Social Needs  . Financial resource strain: None  . Food insecurity - worry: None  . Food insecurity - inability: None  . Transportation needs - medical: None  . Transportation needs - non-medical: None  Occupational History  .  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 Smoking status: Current Every Day Smoker    Packs/day: 0.50    Years: 30.00    Pack years: 15.00    Types: Cigarettes  . Smokeless tobacco: Never Used  Substance and Sexual Activity  . Alcohol use: No  . Drug use: Yes    Types: Cocaine    Comment: Heroine--only used briefly before.  Marland Kitchen Sexual activity: No  Other Topics Concern  . None  Social History Narrative   Born and raised in [place], [place].   Fun: Doesn't do a thing - never leaves the house.   Denies religious beliefs affecting health care.    Recently moved in with father for caregiver purposes   Not in touch with older son.   Younger son incarcerated for drug related reasons.   Additional Social History:                         Sleep: Good  Appetite:  Good  Current Medications: Current Facility-Administered Medications  Medication Dose Route Frequency Provider Last Rate Last Dose  . acetaminophen (TYLENOL) tablet 650 mg  650 mg Oral Q6H PRN Okonkwo, Justina A, NP   650 mg at 10/31/17 1719  . alum & mag hydroxide-simeth (MAALOX/MYLANTA) 200-200-20 MG/5ML suspension 30 mL  30 mL Oral Q4H PRN Okonkwo, Justina A, NP      . cyclobenzaprine (FLEXERIL) tablet 5 mg  5 mg Oral BID PRN Cobos, Rockey Situ, MD   5 mg at 11/01/17 0837  . [START ON 11/02/2017] DULoxetine (CYMBALTA) DR capsule 60 mg  60 mg Oral Daily Solomiya Pascale, Gerlene Burdock, FNP      . fluconazole (DIFLUCAN) tablet 150 mg  150 mg Oral Q3 days Kristyanna Barcelo, Gerlene Burdock, FNP   150 mg at 11/01/17 1316  . gabapentin (NEURONTIN) capsule 400 mg  400 mg Oral TID Nira Conn A, NP   400 mg at 11/01/17 1317  . hydrochlorothiazide (HYDRODIURIL) tablet 25 mg  25 mg Oral Daily Nira Conn A, NP   25 mg at 11/01/17 0833  . hydrOXYzine (ATARAX/VISTARIL) tablet 25 mg  25 mg Oral TID PRN Ferne Reus A, NP   25 mg at 11/01/17 0836  . insulin aspart (novoLOG) injection 0-20 Units  0-20 Units  Subcutaneous TID WC Nira Conn A, NP   7 Units at 11/01/17 1214  . insulin aspart (novoLOG) injection 0-5 Units  0-5 Units Subcutaneous QHS Nira Conn A, NP   2 Units at 10/31/17 2114  . insulin aspart (novoLOG) injection 4 Units  4 Units Subcutaneous TID WC Okonkwo, Justina A, NP   4 Units at 11/01/17 1211  . insulin glargine (LANTUS) injection 20 Units  20 Units Subcutaneous QHS Okonkwo, Justina A, NP   20 Units at 10/31/17 2114  . lidocaine (LIDODERM) 5 % 1 patch  1 patch Transdermal Daily Oneta Rack, NP   1 patch at 11/01/17 [phone]  . magnesium hydroxide (MILK OF MAGNESIA) suspension 30 mL  30 mL Oral Daily PRN Okonkwo, Justina A, NP      . metFORMIN (GLUCOPHAGE) tablet 850 mg  850 mg Oral BID WC Nira Conn A, NP   850 mg at 11/01/17 0831  . mirtazapine (REMERON) tablet 15 mg  15 mg Oral QHS Okonkwo, Justina A, NP   15 mg at 10/31/17 2111  . mupirocin ointment (BACTROBAN) 2 % 1 application  1 application Nasal BID Okonkwo, Justina A, NP   1 application at 10/31/17 1716  . nicotine (NICODERM CQ - dosed in mg/24 hours) patch 21 mg  21 mg Transdermal Daily Nira Conn A, NP   21 mg at 10/31/17 0807  . pantoprazole (PROTONIX) EC tablet 40 mg  40 mg Oral Daily Nira Conn A, NP   40 mg at 11/01/17 0830    Lab Results:  Results for orders placed or performed during the hospital encounter of 10/29/17 (from the past 48 hour(s))  Glucose, capillary     Status: Abnormal   Collection Time: 10/30/17  4:57 PM  Result Value Ref Range   Glucose-Capillary 238 (H) 65 - 99 mg/dL   Comment 1 Notify RN   Glucose, capillary     Status: Abnormal   Collection Time: 10/30/17  9:14 PM  Result Value Ref Range   Glucose-Capillary 260 (H) 65 - 99 mg/dL   Comment 1 Notify RN    Comment 2 Document in Chart   Glucose, capillary     Status: Abnormal   Collection Time: 10/31/17  5:53 AM  Result Value Ref Range   Glucose-Capillary 162 (H) 65 - 99 mg/dL   Comment 1 Notify RN    Comment 2 Document in Chart    Glucose, capillary     Status: Abnormal   Collection Time: 10/31/17 12:09 PM  Result Value Ref Range   Glucose-Capillary 282 (H) 65 - 99 mg/dL   Comment 1 Notify RN    Comment 2 Document in Chart   Glucose, capillary     Status: Abnormal   Collection Time: 10/31/17  5:21 PM  Result Value Ref Range   Glucose-Capillary 195 (H) 65 - 99 mg/dL  Glucose, capillary     Status: Abnormal   Collection Time: 10/31/17  9:03 PM  Result Value Ref Range   Glucose-Capillary 226 (H) 65 - 99 mg/dL  Glucose, capillary     Status: Abnormal   Collection Time: 11/01/17  6:14 AM  Result Value Ref Range   Glucose-Capillary 208 (H) 65 - 99 mg/dL  TSH     Status: None   Collection Time: 11/01/17  6:32 AM  Result Value Ref Range   TSH 3.912 0.350 - 4.500 uIU/mL    Comment: Performed by a 3rd Generation assay with a functional sensitivity of <=0.01 uIU/mL. Performed at [facility], [address] [place], [place] [address]   Glucose, capillary     Status: Abnormal   Collection Time: 11/01/17 11:49 AM  Result Value Ref Range   Glucose-Capillary 216 (H) 65 - 99 mg/dL    Blood Alcohol level:  Lab Results  Component Value Date   ETH <10 10/27/2017   ETH <5 03/15/2017    Metabolic Disorder Labs: Lab Results  Component Value Date   HGBA1C 10.0 (H) 03/21/2017   MPG 240 03/21/2017   MPG 298 10/29/2016   No  results found for: PROLACTIN No results found for: CHOL, TRIG, HDL, CHOLHDL, VLDL, LDLCALC  Physical Findings: AIMS: Facial and Oral Movements Muscles of Facial Expression: None, normal Lips and Perioral Area: None, normal Jaw: None, normal Tongue: None, normal,Extremity Movements Upper (arms, wrists, hands, fingers): None, normal Lower (legs, knees, ankles, toes): None, normal, Trunk Movements Neck, shoulders, hips: None, normal, Overall Severity Severity of abnormal movements (highest score from questions above): None, normal Incapacitation due to abnormal movements:  None, normal Patient's awareness of abnormal movements (rate only patient's report): No Awareness, Dental Status Current problems with teeth and/or dentures?: No Does patient usually wear dentures?: No  CIWA:    COWS:     Musculoskeletal: Strength & Muscle Tone: within normal limits Gait & Station: normal Patient leans: N/A  Psychiatric Specialty Exam: Physical Exam  Nursing note and vitals reviewed. Constitutional: She is oriented to person, place, and time. She appears well-developed and well-nourished.  Cardiovascular: Normal rate.  Respiratory: Effort normal.  Musculoskeletal: Normal range of motion.  Neurological: She is alert and oriented to person, place, and time.  Skin: Skin is warm.    Review of Systems  Constitutional: Negative.   HENT: Negative.   Eyes: Negative.   Respiratory: Negative.   Cardiovascular: Negative.   Gastrointestinal: Negative.   Genitourinary: Positive for frequency and urgency.       Reports labia and vagina feels irritated and raw  Musculoskeletal: Positive for back pain and myalgias.  Skin: Negative.   Neurological: Negative.   Endo/Heme/Allergies: Negative.   Psychiatric/Behavioral: Negative.     Blood pressure (!) 133/91, pulse 85, temperature 98.4 F (36.9 C), temperature source Oral, resp. rate 18, height 5\' 9"  (1.753 m), weight 130.2 kg (287 lb).Body mass index is 42.38 kg/m.  General Appearance: Casual  Eye Contact:  Good  Speech:  Clear and Coherent and Normal Rate  Volume:  Normal  Mood:  Depressed  Affect:  Flat  Thought Process:  Goal Directed and Descriptions of Associations: Intact  Orientation:  Full (Time, Place, and Person)  Thought Content:  WDL  Suicidal Thoughts:  No  Homicidal Thoughts:  No  Memory:  Immediate;   Good Recent;   Good Remote;   Good  Judgement:  Good  Insight:  Good  Psychomotor Activity:  Normal  Concentration:  Concentration: Good and Attention Span: Good  Recall:  Good  Fund of Knowledge:   Good  Language:  Good  Akathisia:  No  Handed:  Right  AIMS (if indicated):     Assets:  Communication Skills Desire for Improvement Financial Resources/Insurance Housing  ADL's:  Intact  Cognition:  WNL  Sleep:  Number of Hours: 6.75   Problems Addressed: MDD Severe  Treatment Plan Summary: Daily contact with patient to assess and evaluate symptoms and progress in treatment, Medication management and Plan is to:  -Increase Cymbalta 60 mg PO Daily for mood stability -Continue Gabapentin 400 mg PO TID -Continue Vistaril 25 mg PO TID PRN for anxiety -Continue Remeron 15 mg PO QHS for mood stability -Ordered UA to monitor for UTI -Start Diflucan 150 mg PO Q72H for yeast infection -Encourage group therapy participation  Maryfrances Bunnellravis B Makeyla Govan, FNP 11/01/2017, 2:06 PM

## 2017-11-02 DIAGNOSIS — Z560 Unemployment, unspecified: Secondary | ICD-10-CM

## 2017-11-02 DIAGNOSIS — R45851 Suicidal ideations: Secondary | ICD-10-CM

## 2017-11-02 DIAGNOSIS — E119 Type 2 diabetes mellitus without complications: Secondary | ICD-10-CM

## 2017-11-02 DIAGNOSIS — I10 Essential (primary) hypertension: Secondary | ICD-10-CM

## 2017-11-02 DIAGNOSIS — Z636 Dependent relative needing care at home: Secondary | ICD-10-CM

## 2017-11-02 LAB — GLUCOSE, CAPILLARY
GLUCOSE-CAPILLARY: 187 mg/dL — AB (ref 65–99)
GLUCOSE-CAPILLARY: 293 mg/dL — AB (ref 65–99)
GLUCOSE-CAPILLARY: 234 mg/dL — AB (ref 65–99)
Glucose-Capillary: 168 mg/dL — ABNORMAL HIGH (ref 65–99)

## 2017-11-02 NOTE — Progress Notes (Signed)
D: Pt was in the dayroom upon initial approach.  Pt presents with depressed, anxious affect and mood.  She reports her day has been "good."  Reports she "had a lot of goals today and I achieved all of them."  Pt began discussing what she accomplished today, including setting up a calendar for the next month with groups and meetings she wants to attend, setting up a doctor appointment, and making an appointment with "[facility]."  Pt denies SI/HI, denies hallucinations, reports chronic back pain of 7/10.  Pt has been visible in milieu interacting with peers and staff appropriately.  Pt attended evening group.    A: Introduced self to pt.  Actively listened to pt and offered support and encouragement. Medications administered per order.  PRN medication administered for muscle spasms and anxiety.  Heat packs provided for pain.  Q15 minute safety checks maintained.  R: Pt is safe on the unit.  Pt is compliant with medications.  Pt verbally contracts for safety.  Will continue to monitor and assess.

## 2017-11-02 NOTE — Progress Notes (Signed)
Patient denies SI, HI and AVH.  Patient had some questions about her medications which she resolved with her doctor this shift.  Patient has been attending groups and compliant with medications.   Assess patient for safety, offer medications as prescribed, engage patietn in 1:1 Staff talks.   Continue to monitor as planned. Patient able to contract for safety

## 2017-11-02 NOTE — Plan of Care (Signed)
  Progressing Safety: Periods of time without injury will increase 11/02/2017 2211 - Progressing by Arrie Aranhurch, Kayne Yuhas J, RN Note Pt has not harmed self or others today.  She denies SI/HI and verbally contracts for safety.

## 2017-11-02 NOTE — Progress Notes (Signed)
Recreation Therapy Notes  Date: 11/02/17 Time: 0930 Location: [address] Dayroom  Group Topic: Stress Management  Goal Area(s) Addresses:  Patient will verbalize importance of using healthy stress management.  Patient will identify positive emotions associated with healthy stress management.   Behavioral Response: Engaged  Intervention: Stress Management  Activity :  Guided Imagery.  LRT introduced the stress management technique of guided imagery.  LRT read a script that allowed patients to take a stroll on the beach.  Patients were to follow along as LRT read script to engage in activity.  Education:  Stress Management, Discharge Planning.   Education Outcome: Acknowledges edcuation/In group clarification offered/Needs additional education  Clinical Observations/Feedback: Pt attended group.    Caroll RancherMarjette Ashawn Rinehart, LRT/CTRS         Caroll RancherLindsay, Olusegun Gerstenberger A 11/02/2017 11:33 AM

## 2017-11-02 NOTE — Progress Notes (Signed)
[facility]  11/02/2017 5:33 PM Claudia Erickson  MRN:  [id number] Subjective:   52 y.o Caucasian female, divorced, lives with her father, unemployed. Background history of MDD and multiple medical comorbidity. Presented to the ER via emergency services. She was found unconscious in her motel room by the hotel maid. She had overdosed on street drugs. She had a suicide. She had purchased fentanyl and ativan specifically to end her life. She had stopped taking care of her diabetes before the attempt. She was positive for cocaine. Patient was managed medically before being stepped down to our unit.   Chart reviewed today. Patient discussed at team today.  Nursing staff reports that patient has been appropriate on the unit. Patient has been interacting well with peers. No behavioral issues. Patient has not voiced any suicidal thoughts. Patient has not been observed to be internally stimulated. Patient has been adherent with treatment recommendations. Patient has been tolerating their medication well.   Seen today. Patient tells me that she planned the suicide. Tells me she has had three other attempts. She has gradually used more dangerous means. Says she is surprised to be alive today. She remains ambivalent about surviving. Says maybe God wants her to live. Patient says she took care of her mother until she passed just over a year ago. She is taking care of her father who is demented. Says she has two children. The older one does not speak with her. Her younger child is incarcerated. Says her boyfriend and members of her family use drugs. Patient has been using more and more drugs lately. Says cocaine is her drug of choice. She stopped attending AA. We talked about NA or any other support group. We explored rehab. Patient is not motivated to do that at this time. Says she wants to take care of her father. We agreed she should think it through. She tells me she has been working closely with peer  support. She has been keeping a diary of her daily activity. She showed more positive affirmations that she has put up on her mirror.  I acknowledge the benefits but at the same time pointed out that while using she would not process those affirmations and would likely act impulsively. Patient is tolerating her medications well. She has been reading here. No associated psychosis. No evidence of mania. No overwhelming anxiety. Not pervasively depressed.  No thoughts of harming others. No thoughts of violence. No access to weapons.   Principal Problem: MDD (major depressive disorder), severe (HCC) Diagnosis:   Patient Active Problem List   Diagnosis Date Noted  . Vaginal candidiasis [B37.3] 11/01/2017  . MDD (major depressive disorder), severe (HCC) [F32.2] 10/29/2017  . Overdose [T50.901A] 10/27/2017  . Diabetic neuropathy (HCC) [E11.40] 06/09/2017  . Cocaine abuse (HCC) [F14.10] 03/16/2017  . Tobacco abuse [Z72.0] 02/02/2017  . Panic attack [F41.0]   . Chronic venous insufficiency [I87.2] 10/28/2016  . Hyponatremia [E87.1] 10/28/2016  . Hyperglycemia [R73.9]   . Intentional drug overdose (HCC) [T50.902A]   . Suicidal ideation [R45.851]   . Urinary frequency [R35.0] 04/08/2015  . Anxiety and depression [F41.9, F32.9] 12/26/2014  . Insomnia [G47.00] 12/26/2014  . Severe episode of recurrent major depressive disorder, without psychotic features (HCC) [F33.2] 08/27/2014  . Aspiration into airway [T17.908A] 12/06/2013  . Aspiration pneumonia (HCC) [J69.0] 12/06/2013  . Polysubstance abuse (HCC) [F19.10] 12/06/2013  . Diabetes mellitus (HCC) [E11.9] 12/06/2013  . Syncope [R55] 12/06/2013  . Leucocytosis [D72.829] 12/06/2013  . Acute respiratory failure (  HCC) [J96.00] 12/06/2013  . Elevated LFTs [R94.5] 12/06/2013   Total Time spent with patient: 20 minutes  Past Psychiatric History: As in H&P  Past Medical History:  Past Medical History:  Diagnosis Date  . Anxiety and depression   .  Chronic back pain   . Diabetes mellitus without complication (HCC)   . Diabetic neuropathy (HCC) 06/09/2017  . GERD (gastroesophageal reflux disease)   . Hypertension   . Panic attack 1990s  . Peripheral neuropathy   . Positive TB test   . Substance abuse (HCC)   . Substance induced mood disorder (HCC)   . UTI (lower urinary tract infection)     Past Surgical History:  Procedure Laterality Date  . ABDOMINAL HYSTERECTOMY    . CHOLECYSTECTOMY    . ROTATOR CUFF REPAIR    . TONSILLECTOMY     Family History:  Family History  Problem Relation Age of Onset  . Diabetes Mellitus II Father   . Hyperlipidemia Father   . Diabetes Father   . Colon cancer Father 58       not sure of age he was diagnosed  . Arthritis Mother   . Hypertension Mother   . Heart disease Mother   . Drug abuse Son        incarcerated with drug issues  . CAD Neg Hx    Family Psychiatric  History: As in H&P Social History:  Social History   Substance and Sexual Activity  Alcohol Use No     Social History   Substance and Sexual Activity  Drug Use Yes  . Types: Cocaine   Comment: Heroine--only used briefly before.    Social History   Socioeconomic History  . Marital status: Single    Spouse name: None  . Number of children: 2  . Years of education: 47  . Highest education level: None  Social Needs  . Financial resource strain: None  . Food insecurity - worry: None  . Food insecurity - inability: None  . Transportation needs - medical: None  . Transportation needs - non-medical: None  Occupational History  . 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 Smoking status: Current Every Day Smoker    Packs/day: 0.50    Years: 30.00    Pack years: 15.00    Types: Cigarettes  . Smokeless tobacco: Never Used  Substance and Sexual Activity  . Alcohol use: No  . Drug use: Yes    Types: Cocaine    Comment: Heroine--only used briefly before.  Marland Kitchen Sexual  activity: No  Other Topics Concern  . None  Social History Narrative   Born and raised in [place], [place].   Fun: Doesn't do a thing - never leaves the house.   Denies religious beliefs affecting health care.    Recently moved in with father for caregiver purposes   Not in touch with older son.   Younger son incarcerated for drug related reasons.   Additional Social History:      Sleep: Fair  Appetite:  Good  Current Medications: Current Facility-Administered Medications  Medication Dose Route Frequency Provider Last Rate Last Dose  . acetaminophen (TYLENOL) tablet 650 mg  650 mg Oral Q6H PRN Okonkwo, Justina A, NP   650 mg at 10/31/17 1719  . alum & mag hydroxide-simeth (MAALOX/MYLANTA) 200-200-20 MG/5ML suspension 30 mL  30 mL Oral Q4H PRN Okonkwo, Justina A, NP      . cyclobenzaprine (FLEXERIL)  tablet 5 mg  5 mg Oral BID PRN Cobos, Rockey Situ, MD   5 mg at 11/02/17 0911  . DULoxetine (CYMBALTA) DR capsule 60 mg  60 mg Oral Daily Money, Gerlene Burdock, FNP   60 mg at 11/02/17 0905  . fluconazole (DIFLUCAN) tablet 150 mg  150 mg Oral Q3 days Money, Gerlene Burdock, FNP   150 mg at 11/01/17 1316  . gabapentin (NEURONTIN) capsule 400 mg  400 mg Oral TID Nira Conn A, NP   400 mg at 11/02/17 1216  . hydrochlorothiazide (HYDRODIURIL) tablet 25 mg  25 mg Oral Daily Nira Conn A, NP   25 mg at 11/02/17 0906  . hydrOXYzine (ATARAX/VISTARIL) tablet 25 mg  25 mg Oral TID PRN Ferne Reus A, NP   25 mg at 11/02/17 0911  . insulin aspart (novoLOG) injection 0-20 Units  0-20 Units Subcutaneous TID WC Nira Conn A, NP   4 Units at 11/02/17 1218  . insulin aspart (novoLOG) injection 0-5 Units  0-5 Units Subcutaneous QHS Nira Conn A, NP   2 Units at 11/01/17 2211  . insulin aspart (novoLOG) injection 4 Units  4 Units Subcutaneous TID WC Okonkwo, Justina A, NP   4 Units at 11/02/17 1218  . insulin glargine (LANTUS) injection 20 Units  20 Units Subcutaneous QHS Okonkwo, Justina A, NP   20 Units at  11/01/17 2212  . lidocaine (LIDODERM) 5 % 1 patch  1 patch Transdermal Daily Oneta Rack, NP   1 patch at 11/02/17 0904  . magnesium hydroxide (MILK OF MAGNESIA) suspension 30 mL  30 mL Oral Daily PRN Okonkwo, Justina A, NP      . metFORMIN (GLUCOPHAGE) tablet 850 mg  850 mg Oral BID WC Nira Conn A, NP   850 mg at 11/02/17 0906  . mirtazapine (REMERON) tablet 15 mg  15 mg Oral QHS Okonkwo, Justina A, NP   15 mg at 11/01/17 2213  . nicotine (NICODERM CQ - dosed in mg/24 hours) patch 21 mg  21 mg Transdermal Daily Nira Conn A, NP   21 mg at 11/02/17 0907  . pantoprazole (PROTONIX) EC tablet 40 mg  40 mg Oral Daily Nira Conn A, NP   40 mg at 11/02/17 1610    Lab Results:  Results for orders placed or performed during the hospital encounter of 10/29/17 (from the past 48 hour(s))  Glucose, capillary     Status: Abnormal   Collection Time: 10/31/17  9:03 PM  Result Value Ref Range   Glucose-Capillary 226 (H) 65 - 99 mg/dL  Glucose, capillary     Status: Abnormal   Collection Time: 11/01/17  6:14 AM  Result Value Ref Range   Glucose-Capillary 208 (H) 65 - 99 mg/dL  TSH     Status: None   Collection Time: 11/01/17  6:32 AM  Result Value Ref Range   TSH 3.912 0.350 - 4.500 uIU/mL    Comment: Performed by a 3rd Generation assay with a functional sensitivity of <=0.01 uIU/mL. Performed at [facility], [address], [place], [place] [address]   Glucose, capillary     Status: Abnormal   Collection Time: 11/01/17 11:49 AM  Result Value Ref Range   Glucose-Capillary 216 (H) 65 - 99 mg/dL  Glucose, capillary     Status: Abnormal   Collection Time: 11/01/17  5:33 PM  Result Value Ref Range   Glucose-Capillary 240 (H) 65 - 99 mg/dL  Glucose, capillary     Status: Abnormal  Collection Time: 11/01/17  9:08 PM  Result Value Ref Range   Glucose-Capillary 202 (H) 65 - 99 mg/dL   Comment 1 Notify RN    Comment 2 Document in Chart   Glucose, capillary     Status:  Abnormal   Collection Time: 11/02/17  6:13 AM  Result Value Ref Range   Glucose-Capillary 168 (H) 65 - 99 mg/dL   Comment 1 QC Due   Glucose, capillary     Status: Abnormal   Collection Time: 11/02/17 11:50 AM  Result Value Ref Range   Glucose-Capillary 187 (H) 65 - 99 mg/dL    Blood Alcohol level:  Lab Results  Component Value Date   ETH <10 10/27/2017   ETH <5 03/15/2017    Metabolic Disorder Labs: Lab Results  Component Value Date   HGBA1C 10.0 (H) 03/21/2017   MPG 240 03/21/2017   MPG 298 10/29/2016   No results found for: PROLACTIN No results found for: CHOL, TRIG, HDL, CHOLHDL, VLDL, LDLCALC  Physical Findings: AIMS: Facial and Oral Movements Muscles of Facial Expression: None, normal Lips and Perioral Area: None, normal Jaw: None, normal Tongue: None, normal,Extremity Movements Upper (arms, wrists, hands, fingers): None, normal Lower (legs, knees, ankles, toes): None, normal, Trunk Movements Neck, shoulders, hips: None, normal, Overall Severity Severity of abnormal movements (highest score from questions above): None, normal Incapacitation due to abnormal movements: None, normal Patient's awareness of abnormal movements (rate only patient's report): No Awareness, Dental Status Current problems with teeth and/or dentures?: No Does patient usually wear dentures?: No  CIWA:    COWS:     Musculoskeletal: Strength & Muscle Tone: within normal limits Gait & Station: normal Patient leans: N/A  Psychiatric Specialty Exam: Physical Exam  ROS  Blood pressure 103/76, pulse 100, temperature 98.4 F (36.9 C), temperature source Oral, resp. rate 18, height 5\' 9"  (1.753 m), weight 130.2 kg (287 lb).Body mass index is 42.38 kg/m.  General Appearance: Neatly dressed, engaging well. Not in any distress.  Eye Contact:  Good  Speech:  Clear and Coherent and Normal Rate  Volume:  Normal  Mood:  Feeling better here  Affect:  Appropriate and Restricted  Thought Process:   Linear  Orientation:  Full (Time, Place, and Person)  Thought Content:  Less rumination about the negatives in her life. No delusional theme. No preoccupation with violent thoughts. No hallucination in any modality.   Suicidal Thoughts:  None today  Homicidal Thoughts:  No  Memory:  Immediate;   Good Recent;   Good Remote;   Good  Judgement:  Fair  Insight:  Partial as she is not motivated to deal with her addiction at this time.   Psychomotor Activity:  Normal  Concentration:  Concentration: Good and Attention Span: Good  Recall:  Good  Fund of Knowledge:  Good  Language:  Good  Akathisia:  Negative  Handed:    AIMS (if indicated):     Assets:  Communication Skills Housing Resilience  ADL's:  Intact  Cognition:  WNL  Sleep:  Number of Hours: 4.25     Treatment Plan Summary: Patient is not pervasively depressed. Her mental state has been improving gradually. She is still craving for substances. She is not processing ways of dealing with her addiction. I am worried that while under the influence she is likely going to act impulsively again. We plan to motivate her towards addiction treatment.  Psychiatric: SUD SIMD MDD  Medical: DM HTN  Psychosocial:  Unemployed Limited support Medical  issues Family dynamics Bereavement     PLAN: 1. Increase Duloxetine to 90 mg daily 2. Motivational enhancement  3. Continue to monitor mood, behavior and interaction with peers 4. Continue to encourage unit groups and therapeutic activity    Georgiann Cocker, MD 11/02/2017, 5:33 PM

## 2017-11-02 NOTE — Progress Notes (Signed)
Patient attended N/A group meeting tonight. 

## 2017-11-02 NOTE — Progress Notes (Signed)
Pt attended orientation/goals group 

## 2017-11-02 NOTE — [facility] Group Notes (Signed)
LCSW Group Therapy Note 11/02/2017 2:50 PM  Type of Therapy/Topic: Group Therapy: Emotion Regulation  Participation Level: Active   Description of Group:  The purpose of this group is to assist patients in learning to regulate negative emotions and experience positive emotions. Patients will be guided to discuss ways in which they have been vulnerable to their negative emotions. These vulnerabilities will be juxtaposed with experiences of positive emotions or situations, and patients will be challenged to use positive emotions to combat negative ones. Special emphasis will be placed on coping with negative emotions in conflict situations, and patients will process healthy conflict resolution skills.  Therapeutic Goals: 1. Patient will identify two positive emotions or experiences to reflect on in order to balance out negative emotions 2. Patient will label two or more emotions that they find the most difficult to experience 3. Patient will demonstrate positive conflict resolution skills through discussion and/or role plays  Summary of Patient Progress:  Claudia Erickson was engaged throughout the entire group session. She participated and contributed to the group's discussion regarding emotional regulation. Claudia Erickson stated that once she discharges, she plans to work with a peer support specialist for more support in her road to recovery. She also stated that she would like to explore options for day programs in order to add more structure to her day.   Therapeutic Modalities:  Cognitive Behavioral Therapy Feelings Identification Dialectical Behavioral Therapy   Alcario DroughtJolan Akasha Melena LCSWA Clinical Social Worker

## 2017-11-02 NOTE — [facility] Group Notes (Signed)
[facility] Group Notes:  (Nursing/MHT/Case Management/Adjunct)  Date:  11/02/2017  Time:  2:15 PM  Type of Therapy:  Psychoeducational Skills  Participation Level:  Active  Participation Quality:  Appropriate  Affect:  Appropriate  Cognitive:  Alert  Insight:  Appropriate  Engagement in Group:  Engaged  Modes of Intervention:  Discussion and Education  Summary of Progress/Problems:  Pt participated in group. During this group patients discussed values. Pt's were asked to state which values are important to them and which values they would like to work on improving. Pt's completed two worksheets from their daily packets.   Karren CobbleFizah G Aurora Rody 11/02/2017, 2:15 PM

## 2017-11-02 NOTE — Progress Notes (Signed)
Recreation Therapy Notes  Animal-Assisted Activity (AAA) Program Checklist/Progress Notes Patient Eligibility Criteria Checklist & Daily Group note for Rec TxIntervention  Date: 1.29.19 Time: 2:45 pm  Location: [address] Dayroom   AAA/T Program Assumption of Risk Form signed by Patient/ or Parent Legal Guardian Yes  Patient is free of allergies or sever asthma Yes  Patient reports no fear of animals Yes  Patient reports no history of cruelty to animals Yes  Patient understands his/her participation is voluntary Yes  Patient washes hands before animal contact Yes  Patient washes hands after animal contact Yes  Behavioral Response: Did not attend   Chalon Zobrist, Recreation Therapy Intern  Claudia Erickson 11/02/2017 9:31 AM 

## 2017-11-02 NOTE — Progress Notes (Signed)
Patient denies SI, HI and AVH.  Patient has attended groups, engaged in unit activity and been compliant with treatment and medications this shift. Patient has had no incidents of behavioral dyscontrol.   Assess patient for safety, offer medications as prescribed, engage patient in 1:1 staff talks.   Patient able to contract for safety.  Continue to monitor as planned.  

## 2017-11-03 LAB — GLUCOSE, CAPILLARY
GLUCOSE-CAPILLARY: 259 mg/dL — AB (ref 65–99)
GLUCOSE-CAPILLARY: 221 mg/dL — AB (ref 65–99)
Glucose-Capillary: 179 mg/dL — ABNORMAL HIGH (ref 65–99)
Glucose-Capillary: 213 mg/dL — ABNORMAL HIGH (ref 65–99)

## 2017-11-03 LAB — URINALYSIS, ROUTINE W REFLEX MICROSCOPIC
Bilirubin Urine: NEGATIVE
Glucose, UA: >=500 mg/dL — AB
KETONES UR: NEGATIVE mg/dL
Nitrite: NEGATIVE
PH: 6 (ref 5.0–8.0)
Protein, ur: NEGATIVE mg/dL
SPECIFIC GRAVITY, URINE: 1.017 (ref 1.005–1.030)

## 2017-11-03 MED ORDER — DULOXETINE HCL 30 MG PO CPEP
90.0000 mg | ORAL_CAPSULE | Freq: Every day | ORAL | Status: DC
  Administered 2017-11-04 – 2017-11-07 (×4): 90 mg via ORAL
  Filled 2017-11-03 (×3): qty 21
  Filled 2017-11-03 (×4): qty 3

## 2017-11-03 MED ORDER — DULOXETINE HCL 30 MG PO CPEP
30.0000 mg | ORAL_CAPSULE | Freq: Once | ORAL | Status: AC
  Administered 2017-11-03: 30 mg via ORAL
  Filled 2017-11-03 (×2): qty 1

## 2017-11-03 NOTE — Progress Notes (Signed)
Adult Psychoeducational Group Note  Date:  11/03/2017 Time:  4:00 PM  Group Topic/Focus:  Relaxation Group  Participation Level:  Active  Participation Quality:  Appropriate, Attentive and Supportive  Affect:  Appropriate  Cognitive:  Alert and Appropriate  Insight: Improving  Engagement in Group:  Developing/Improving  Modes of Intervention:  Discussion and Support  Additional Comments:  Patient was engaged and enjoyed the meditative exercise.  Claudia Erickson 11/03/2017, 5:49 PM  

## 2017-11-03 NOTE — Progress Notes (Signed)
[facility] MD Progress Note  11/03/2017 1:49 PM Claudia Erickson  MRN:  [id number]   Subjective:  Patient reports that she is ready for discharge. She states she still has depression and rates it at 6/10 and her anxiety "is off the charts, but it always is." She denies any SI/HI/AVh and ccntracts for safety. She is focused on discharge but also states she understands why she is still a high risk due to a well planned suicide attempt, but she would like to discharge soon.  Objective: Patient's chart and findings reviewed and discussed with treatment team. Patient presents in the day room and is interacting appropriately with staff and peers. She has been cooperative and future oriented on discharge. However, she is still reporting depression and elevated anxiety. Will increase the Cymbalta to 90 mg as planned.  Principal Problem: MDD (major depressive disorder), severe (HCC) Diagnosis:   Patient Active Problem List   Diagnosis Date Noted  . Vaginal candidiasis [B37.3] 11/01/2017  . MDD (major depressive disorder), severe (HCC) [F32.2] 10/29/2017  . Overdose [T50.901A] 10/27/2017  . Diabetic neuropathy (HCC) [E11.40] 06/09/2017  . Cocaine abuse (HCC) [F14.10] 03/16/2017  . Tobacco abuse [Z72.0] 02/02/2017  . Panic attack [F41.0]   . Chronic venous insufficiency [I87.2] 10/28/2016  . Hyponatremia [E87.1] 10/28/2016  . Hyperglycemia [R73.9]   . Intentional drug overdose (HCC) [T50.902A]   . Suicidal ideation [R45.851]   . Urinary frequency [R35.0] 04/08/2015  . Anxiety and depression [F41.9, F32.9] 12/26/2014  . Insomnia [G47.00] 12/26/2014  . Severe episode of recurrent major depressive disorder, without psychotic features (HCC) [F33.2] 08/27/2014  . Aspiration into airway [T17.908A] 12/06/2013  . Aspiration pneumonia (HCC) [J69.0] 12/06/2013  . Polysubstance abuse (HCC) [F19.10] 12/06/2013  . Diabetes mellitus (HCC) [E11.9] 12/06/2013  . Syncope [R55] 12/06/2013  . Leucocytosis [D72.829]  12/06/2013  . Acute respiratory failure (HCC) [J96.00] 12/06/2013  . Elevated LFTs [R94.5] 12/06/2013   Total Time spent with patient: 25 minutes  Past Psychiatric History: See H&P  Past Medical History:  Past Medical History:  Diagnosis Date  . Anxiety and depression   . Chronic back pain   . Diabetes mellitus without complication (HCC)   . Diabetic neuropathy (HCC) 06/09/2017  . GERD (gastroesophageal reflux disease)   . Hypertension   . Panic attack 1990s  . Peripheral neuropathy   . Positive TB test   . Substance abuse (HCC)   . Substance induced mood disorder (HCC)   . UTI (lower urinary tract infection)     Past Surgical History:  Procedure Laterality Date  . ABDOMINAL HYSTERECTOMY    . CHOLECYSTECTOMY    . ROTATOR CUFF REPAIR    . TONSILLECTOMY     Family History:  Family History  Problem Relation Age of Onset  . Diabetes Mellitus II Father   . Hyperlipidemia Father   . Diabetes Father   . Colon cancer Father 59       not sure of age he was diagnosed  . Arthritis Mother   . Hypertension Mother   . Heart disease Mother   . Drug abuse Son        incarcerated with drug issues  . CAD Neg Hx    Family Psychiatric  History: See H&P Social History:  Social History   Substance and Sexual Activity  Alcohol Use No     Social History   Substance and Sexual Activity  Drug Use Yes  . Types: Cocaine   Comment: Heroine--only used briefly before.  Social History   Socioeconomic History  . Marital status: Single    Spouse name: None  . Number of children: 2  . Years of education: 24  . Highest education level: None  Social Needs  . Financial resource strain: None  . Food insecurity - worry: None  . Food insecurity - inability: None  . Transportation needs - medical: None  . Transportation needs - non-medical: None  Occupational History  . 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 Smoking status:  Current Every Day Smoker    Packs/day: 0.50    Years: 30.00    Pack years: 15.00    Types: Cigarettes  . Smokeless tobacco: Never Used  Substance and Sexual Activity  . Alcohol use: No  . Drug use: Yes    Types: Cocaine    Comment: Heroine--only used briefly before.  Marland Kitchen Sexual activity: No  Other Topics Concern  . None  Social History Narrative   Born and raised in [place], [place].   Fun: Doesn't do a thing - never leaves the house.   Denies religious beliefs affecting health care.    Recently moved in with father for caregiver purposes   Not in touch with older son.   Younger son incarcerated for drug related reasons.   Additional Social History:                         Sleep: Good  Appetite:  Good  Current Medications: Current Facility-Administered Medications  Medication Dose Route Frequency Provider Last Rate Last Dose  . acetaminophen (TYLENOL) tablet 650 mg  650 mg Oral Q6H PRN Okonkwo, Justina A, NP   650 mg at 10/31/17 1719  . alum & mag hydroxide-simeth (MAALOX/MYLANTA) 200-200-20 MG/5ML suspension 30 mL  30 mL Oral Q4H PRN Okonkwo, Justina A, NP      . cyclobenzaprine (FLEXERIL) tablet 5 mg  5 mg Oral BID PRN Cobos, Rockey Situ, MD   5 mg at 11/02/17 2111  . [START ON 11/04/2017] DULoxetine (CYMBALTA) DR capsule 90 mg  90 mg Oral Daily Money, Gerlene Burdock, FNP      . fluconazole (DIFLUCAN) tablet 150 mg  150 mg Oral Q3 days Money, Gerlene Burdock, FNP   150 mg at 11/01/17 1316  . gabapentin (NEURONTIN) capsule 400 mg  400 mg Oral TID Nira Conn A, NP   400 mg at 11/03/17 1205  . hydrochlorothiazide (HYDRODIURIL) tablet 25 mg  25 mg Oral Daily Nira Conn A, NP   25 mg at 11/03/17 1610  . hydrOXYzine (ATARAX/VISTARIL) tablet 25 mg  25 mg Oral TID PRN Ferne Reus A, NP   25 mg at 11/02/17 2111  . insulin aspart (novoLOG) injection 0-20 Units  0-20 Units Subcutaneous TID WC Nira Conn A, NP   11 Units at 11/03/17 1205  . insulin aspart (novoLOG) injection 0-5 Units   0-5 Units Subcutaneous QHS Nira Conn A, NP   2 Units at 11/02/17 2112  . insulin aspart (novoLOG) injection 4 Units  4 Units Subcutaneous TID WC Okonkwo, Justina A, NP   4 Units at 11/03/17 1206  . insulin glargine (LANTUS) injection 20 Units  20 Units Subcutaneous QHS Okonkwo, Justina A, NP   20 Units at 11/02/17 2111  . lidocaine (LIDODERM) 5 % 1 patch  1 patch Transdermal Daily Oneta Rack, NP   1 patch at 11/03/17 0810  . magnesium hydroxide (MILK OF  MAGNESIA) suspension 30 mL  30 mL Oral Daily PRN Okonkwo, Justina A, NP      . metFORMIN (GLUCOPHAGE) tablet 850 mg  850 mg Oral BID WC Nira ConnBerry, Jason A, NP   850 mg at 11/03/17 0808  . mirtazapine (REMERON) tablet 15 mg  15 mg Oral QHS Okonkwo, Justina A, NP   15 mg at 11/02/17 2110  . nicotine (NICODERM CQ - dosed in mg/24 hours) patch 21 mg  21 mg Transdermal Daily Nira ConnBerry, Jason A, NP   21 mg at 11/02/17 0907  . pantoprazole (PROTONIX) EC tablet 40 mg  40 mg Oral Daily Nira ConnBerry, Jason A, NP   40 mg at 11/03/17 [id number]    Lab Results:  Results for orders placed or performed during the hospital encounter of 10/29/17 (from the past 48 hour(s))  Glucose, capillary     Status: Abnormal   Collection Time: 11/01/17  5:33 PM  Result Value Ref Range   Glucose-Capillary 240 (H) 65 - 99 mg/dL  Glucose, capillary     Status: Abnormal   Collection Time: 11/01/17  9:08 PM  Result Value Ref Range   Glucose-Capillary 202 (H) 65 - 99 mg/dL   Comment 1 Notify RN    Comment 2 Document in Chart   Glucose, capillary     Status: Abnormal   Collection Time: 11/02/17  6:13 AM  Result Value Ref Range   Glucose-Capillary 168 (H) 65 - 99 mg/dL   Comment 1 QC Due   Glucose, capillary     Status: Abnormal   Collection Time: 11/02/17 11:50 AM  Result Value Ref Range   Glucose-Capillary 187 (H) 65 - 99 mg/dL  Glucose, capillary     Status: Abnormal   Collection Time: 11/02/17  5:34 PM  Result Value Ref Range   Glucose-Capillary 293 (H) 65 - 99 mg/dL   Urinalysis, Routine w reflex microscopic     Status: Abnormal   Collection Time: 11/02/17  7:32 PM  Result Value Ref Range   Color, Urine YELLOW YELLOW   APPearance HAZY (A) CLEAR   Specific Gravity, Urine 1.017 1.005 - 1.030   pH 6.0 5.0 - 8.0   Glucose, UA >=500 (A) NEGATIVE mg/dL   Hgb urine dipstick SMALL (A) NEGATIVE   Bilirubin Urine NEGATIVE NEGATIVE   Ketones, ur NEGATIVE NEGATIVE mg/dL   Protein, ur NEGATIVE NEGATIVE mg/dL   Nitrite NEGATIVE NEGATIVE   Leukocytes, UA LARGE (A) NEGATIVE   RBC / HPF 6-30 0 - 5 RBC/hpf   WBC, UA TOO NUMEROUS TO COUNT 0 - 5 WBC/hpf   Bacteria, UA RARE (A) NONE SEEN   Squamous Epithelial / LPF 0-5 (A) NONE SEEN   WBC Clumps PRESENT    Mucus PRESENT     Comment: Performed at [facility], [address] [place], [place] [address]  Glucose, capillary     Status: Abnormal   Collection Time: 11/02/17  9:06 PM  Result Value Ref Range   Glucose-Capillary 234 (H) 65 - 99 mg/dL  Glucose, capillary     Status: Abnormal   Collection Time: 11/03/17  6:19 AM  Result Value Ref Range   Glucose-Capillary 179 (H) 65 - 99 mg/dL  Glucose, capillary     Status: Abnormal   Collection Time: 11/03/17 12:01 PM  Result Value Ref Range   Glucose-Capillary 259 (H) 65 - 99 mg/dL    Blood Alcohol level:  Lab Results  Component Value Date   ETH <10 10/27/2017   ETH <  5 03/15/2017    Metabolic Disorder Labs: Lab Results  Component Value Date   HGBA1C 10.0 (H) 03/21/2017   MPG 240 03/21/2017   MPG 298 10/29/2016   No results found for: PROLACTIN No results found for: CHOL, TRIG, HDL, CHOLHDL, VLDL, LDLCALC  Physical Findings: AIMS: Facial and Oral Movements Muscles of Facial Expression: None, normal Lips and Perioral Area: None, normal Jaw: None, normal Tongue: None, normal,Extremity Movements Upper (arms, wrists, hands, fingers): None, normal Lower (legs, knees, ankles, toes): None, normal, Trunk Movements Neck, shoulders, hips:  None, normal, Overall Severity Severity of abnormal movements (highest score from questions above): None, normal Incapacitation due to abnormal movements: None, normal Patient's awareness of abnormal movements (rate only patient's report): No Awareness, Dental Status Current problems with teeth and/or dentures?: No Does patient usually wear dentures?: No  CIWA:    COWS:     Musculoskeletal: Strength & Muscle Tone: within normal limits Gait & Station: normal Patient leans: N/A  Psychiatric Specialty Exam: Physical Exam  Nursing note and vitals reviewed. Constitutional: She is oriented to person, place, and time. She appears well-developed and well-nourished.  Cardiovascular: Normal rate.  Respiratory: Effort normal.  Musculoskeletal: Normal range of motion.  Neurological: She is alert and oriented to person, place, and time.  Skin: Skin is warm.    Review of Systems  Constitutional: Negative.   HENT: Negative.   Eyes: Negative.   Respiratory: Negative.   Cardiovascular: Negative.   Gastrointestinal: Negative.   Genitourinary: Negative.   Musculoskeletal: Negative.   Skin: Negative.   Neurological: Negative.   Endo/Heme/Allergies: Negative.   Psychiatric/Behavioral: Positive for depression. Negative for hallucinations and suicidal ideas. The patient is nervous/anxious.     Blood pressure 112/86, pulse 99, temperature 97.8 F (36.6 C), temperature source Oral, resp. rate 18, height 5\' 9"  (1.753 m), weight 130.2 kg (287 lb).Body mass index is 42.38 kg/m.  General Appearance: Casual  Eye Contact:  Good  Speech:  Clear and Coherent and Normal Rate  Volume:  Normal  Mood:  Anxious  Affect:  Congruent  Thought Process:  Goal Directed and Descriptions of Associations: Intact  Orientation:  Full (Time, Place, and Person)  Thought Content:  WDL  Suicidal Thoughts:  No  Homicidal Thoughts:  No  Memory:  Immediate;   Good Recent;   Good Remote;   Good  Judgement:  Good   Insight:  Good  Psychomotor Activity:  Normal  Concentration:  Concentration: Good and Attention Span: Good  Recall:  Good  Fund of Knowledge:  Good  Language:  Good  Akathisia:  No  Handed:  Right  AIMS (if indicated):     Assets:  Communication Skills Desire for Improvement Financial Resources/Insurance Housing Physical Health Social Support  ADL's:  Intact  Cognition:  WNL  Sleep:  Number of Hours: 6.25   Problems Addressed: MDD severe  Treatment Plan Summary: Daily contact with patient to assess and evaluate symptoms and progress in treatment, Medication management and Plan is to:  -Increase Cymbalta 90 mg PO Daily for mood stability -Continue Gabapentin 400 mg PO TID for neuropathic pain -Continue Remeron 15 mg PO QHS for mood stability and sleep -Continue Vistaril 25 mg PO TID PRN for anxiety -Encourage group therapy participation  Maryfrances Bunnell, FNP 11/03/2017, 1:49 PM

## 2017-11-03 NOTE — [facility] Group Notes (Signed)
Adult Psychoeducational Group Note  Date:  11/03/2017 Time:  10:44 AM  Group Topic/Focus:  Goals Group:   The focus of this group is to help patients establish daily goals to achieve during treatment and discuss how the patient can incorporate goal setting into their daily lives to aide in recovery.  Participation Level:  Active  Participation Quality:  Appropriate  Affect:  Appropriate  Cognitive:  Appropriate  Insight: Appropriate  Engagement in Group:  Engaged  Modes of Intervention:  Orientation  Additional Comments:  Pt was appropriate and engaged in orientation group. Pt goal for today is to go home.  Claudia Erickson 11/03/2017, 10:44 AM

## 2017-11-03 NOTE — Progress Notes (Signed)
Psychoeducational Group Note  Date:  11/03/2017 Time: 2045 Group Topic/Focus:  wrap up group  Participation Level: Did Not Attend  Participation Quality:  Not Applicable  Affect:  Not Applicable  Cognitive:  Not Applicable  Insight:  Not Applicable  Engagement in Group: Not Applicable  Additional Comments:  Pt was notified that group was beginning but remained in bed.   Marcille BuffyMcNeil, Roslin Norwood S 11/03/2017, 9:32 PM

## 2017-11-03 NOTE — Progress Notes (Signed)
Patient ID: Claudia Erickson, female   DOB: 12/28/1965, 52 y.o.   MRN: [id number]  Pt currently presents with a flat affect and cooperative behavior. Pt reports to Clinical research associatewriter that she was disappointed she was not discharged today. Pt states "I guess its because they are changing my medications and how serious my attermpt was." To which patient then states "I am sad that it didn't work because I spent so much time planning it. But I guess there is a reason why I am here. I don't want to harm myself." Pt reports good sleep with current medication regimen.   Pt provided with medications per providers orders. Pt's labs and vitals were monitored throughout the night. Pt given a 1:1 about emotional and mental status. Pt supported and encouraged to express concerns and questions. Pt educated on medications and suicide prevention safety.   Pt's safety ensured with 15 minute and environmental checks. Pt currently denies SI/HI and A/V hallucinations. Pt verbally agrees to seek staff if SI/HI or A/VH occurs and to consult with staff before acting on any harmful thoughts. Will continue POC.

## 2017-11-03 NOTE — Plan of Care (Signed)
  Progressing Activity: Interest or engagement in activities will improve 11/03/2017 1528 - Progressing by Angela AdamBeaudry, Scorpio Fortin E, RN Sleeping patterns will improve 11/03/2017 1528 - Progressing by Angela AdamBeaudry, Benjamin Merrihew E, RN Education: Knowledge of [facility] Education information/materials will improve 11/03/2017 1528 - Progressing by Angela AdamBeaudry, Maebelle Sulton E, RN Emotional status will improve 11/03/2017 1528 - Progressing by Angela AdamBeaudry, Arrabella Westerman E, RN Mental status will improve 11/03/2017 1528 - Progressing by Angela AdamBeaudry, Jessina Marse E, RN Verbalization of understanding the information provided will improve 11/03/2017 1528 - Progressing by Angela AdamBeaudry, Evangelos Paulino E, RN Coping: Ability to verbalize frustrations and anger appropriately will improve 11/03/2017 1528 - Progressing by Angela AdamBeaudry, Vallarie Fei E, RN Activity: Interest or engagement in leisure activities will improve 11/03/2017 1528 - Progressing by Angela AdamBeaudry, Ramsey Midgett E, RN Imbalance in normal sleep/wake cycle will improve 11/03/2017 1528 - Progressing by Angela AdamBeaudry, Fontella Shan E, RN

## 2017-11-03 NOTE — Progress Notes (Signed)
Pt attended morning Alcoholics Anonymous group. 

## 2017-11-03 NOTE — Progress Notes (Signed)
D: Patient requested discharge this morning, however, she will not be discharged today.  Patient denies any thoughts of self harm.  She rates her depression as a 6; hopelessness as a 2 and anxiety as a 10.  She reports poor sleep; good appetite; normal energy level and her concentration is good.  Patient's complains of some back pain.  Her goal today is to "go home."  Patient can be intrusive at times, yelling for the nurse from the front desk.  Patient is irritable because she expected to go home today.  Her elevated anxiety is due to not being released.  Patient plans to return home to care for her father who has dementia.   A: Continue to monitor medication management and MD orders.  Safety checks completed every 15 minutes per protocol.  Offer support and encouragement as needed.  R: Patient is receptive to staff; her behavior is appropriate.

## 2017-11-03 NOTE — [facility] Group Notes (Signed)
LCSW Group Therapy Note  11/03/2017 1:15pm  Type of Therapy/Topic:  Group Therapy:  Feelings about Diagnosis  Participation Level:  Active   Description of Group:   This group will allow patients to explore their thoughts and feelings about diagnoses they have received. Patients will be guided to explore their level of understanding and acceptance of these diagnoses. Facilitator will encourage patients to process their thoughts and feelings about the reactions of others to their diagnosis and will guide patients in identifying ways to discuss their diagnosis with significant others in their lives. This group will be process-oriented, with patients participating in exploration of their own experiences, giving and receiving support, and processing challenge from other group members.   Therapeutic Goals: 1. Patient will demonstrate understanding of diagnosis as evidenced by identifying two or more symptoms of the disorder 2. Patient will be able to express two feelings regarding the diagnosis 3. Patient will demonstrate their ability to communicate their needs through discussion and/or role play  Summary of Patient Progress:  Revonda Standardllison was attentive and engaged during today's processing group. She shared that she feels relieved to learn about her diagnosis and "find out that there is a reason why I struggle so much with my mood." Revonda Standardllison shared that she has burned a lot of bridges in her family "that are not repairable." She processed these losses with the group and shared that she also isolates when depressed. "People eventually stop asking me to do things with them because they know I will say no." She continues to show progress in the group setting with improving insight.    Therapeutic Modalities:   Cognitive Behavioral Therapy Brief Therapy Feelings Identification    Ledell PeoplesHeather N Smart, LCSW 11/03/2017 11:42 AM

## 2017-11-04 LAB — GLUCOSE, CAPILLARY
GLUCOSE-CAPILLARY: 185 mg/dL — AB (ref 65–99)
GLUCOSE-CAPILLARY: 213 mg/dL — AB (ref 65–99)
GLUCOSE-CAPILLARY: 255 mg/dL — AB (ref 65–99)
Glucose-Capillary: 203 mg/dL — ABNORMAL HIGH (ref 65–99)

## 2017-11-04 MED ORDER — LORAZEPAM 1 MG PO TABS
1.0000 mg | ORAL_TABLET | Freq: Once | ORAL | Status: AC | PRN
  Administered 2017-11-05: 1 mg via ORAL
  Filled 2017-11-04 (×2): qty 1

## 2017-11-04 MED ORDER — LORAZEPAM 1 MG PO TABS
1.0000 mg | ORAL_TABLET | Freq: Once | ORAL | Status: AC
  Administered 2017-11-04: 1 mg via ORAL
  Filled 2017-11-04: qty 1

## 2017-11-04 NOTE — [facility] Suicide Risk Assessment (Signed)
[facility] INPATIENT:  Family/Significant Other Suicide Prevention Education  Suicide Prevention Education:  Education Completed; Claudia Erickson (pt's father) [phone] has been identified by the patient as the family member/significant other with whom the patient will be residing, and identified as the person(s) who will aid the patient in the event of a mental health crisis (suicidal ideations/suicide attempt).  With written consent from the patient, the family member/significant other has been provided the following suicide prevention education, prior to the and/or following the discharge of the patient.  The suicide prevention education provided includes the following:  Suicide risk factors  Suicide prevention and interventions  National Suicide Hotline telephone number  [facility] assessment telephone number  [facility] Emergency Assistance 911  [facility] and/or Residential Mobile Crisis Unit telephone number  Request made of family/significant other to:  Remove weapons (e.g., guns, rifles, knives), all items previously/currently identified as safety concern.    Remove drugs/medications (over-the-counter, prescriptions, illicit drugs), all items previously/currently identified as a safety concern.  The family member/significant other verbalizes understanding of the suicide prevention education information provided.  The family member/significant other agrees to remove the items of safety concern listed above.  [name] states that he has no safety concerns regarding pt returning to his home. He denies that she has access to weapons/firearms. "I'm ready for her to come home." SPE and aftercare plan reviewed with pt's father. [name] voices no other concerns or questions during conversation.  Claudia Bouvier N Smart LCSW 11/04/2017, 10:49 AM

## 2017-11-04 NOTE — [facility] Group Notes (Signed)
LCSW Group Therapy Note 11/04/2017 2:54 PM  Type of Therapy and Topic: Group Therapy: Feelings around Relapse and Recovery  Participation Level: Did Not Attend   Description of Group:  Patients in this group will discuss emotions they experience before and after a relapse. They will process how experiencing these feelings, or avoidance of experiencing them, relates to having a relapse. Facilitator will guide patients to explore emotions they have related to recovery. Patients will be encouraged to process which emotions are more powerful. They will be guided to discuss the emotional reaction significant others in their lives may have to their relapse or recovery. Patients will be assisted in exploring ways to respond to the emotions of others without this contributing to a relapse.  Therapeutic Goals: 1. Patient will identify two or more emotions that lead to a relapse for them 2. Patient will identify two emotions that result when they relapse 3. Patient will identify two emotions related to recovery 4. Patient will demonstrate ability to communicate their needs through discussion and/or role plays  Summary of Patient Progress:   Invited, chose not to attend.    Therapeutic Modalities:  Cognitive Behavioral Therapy Solution-Focused Therapy Assertiveness Training Relapse Prevention Therapy   Alcario DroughtJolan Pat Sires LCSWA Clinical Social Worker

## 2017-11-04 NOTE — Progress Notes (Signed)
Patient did attend the evening speaker AA meeting.  

## 2017-11-04 NOTE — Progress Notes (Signed)
[facility] MD Progress Note  11/04/2017 5:18 PM ALEEHA BOLINE  MRN:  [id number] Subjective:   52 y.o Caucasian female, divorced, lives with her father, unemployed. Background history of MDD and multiple medical comorbidity. Presented to the ER via emergency services. She was found unconscious in her motel room by the hotel maid. She had overdosed on street drugs. She had a suicide. She had purchased fentanyl and ativan specifically to end her life. She had stopped taking care of her diabetes before the attempt. She was positive for cocaine. Patient was managed medically before being stepped down to our unit.   Chart reviewed today. Patient discussed at team today.  Nursing staff reports that is frustrated with continued hospital stay. She threw a fit today when she realized she is not being discharged. She required PRN Ativan. She has been refusing groups lately. She continues to express ambivalent views about her recent attempt. Not really voicing any plans to harm self again. Limited sleep last night.  SW contacted her father whom patient reported has dementia. He did not express concerns. Patient has not been able to provide a more reliable source of collateral.   Seen today. Patient feels that continued hospitalization has no benefit to her. Says she wants to be back with her father so she can take care of him. Patient repeatedly denies any lingering suicidal thoughts. Says she is becoming frustrated in here hence she has been refusing groups. Patient is not processing her substance use. Plans to stay away by will power. She denies any cravings today. Says she feels her normal self. I probed her about other family members or friends that we can get more information from. Patient says she has a sister whom she has not spoken to in over a year. Says she does not have her contact. Says she is socially isolated and have no other family member we can talk to except her father. I reviewed her chart everywhere.  Patient has faint recollection of her visit to [facility] services. Says she was in [employer] [facility] dependency treatment and likely went there for physical reasons. Patient states that maybe she was stressed with continued care of her father. Says now she has taken a break, she feels refreshed and ready to be back home. No evidence of depression no evidence of mania. No evidence of psychosis. No dissociation. Patient is frustrated but coped well with it.  Principal Problem: MDD (major depressive disorder), severe (HCC) Diagnosis:   Patient Active Problem List   Diagnosis Date Noted  . Vaginal candidiasis [B37.3] 11/01/2017  . MDD (major depressive disorder), severe (HCC) [F32.2] 10/29/2017  . Overdose [T50.901A] 10/27/2017  . Diabetic neuropathy (HCC) [E11.40] 06/09/2017  . Cocaine abuse (HCC) [F14.10] 03/16/2017  . Tobacco abuse [Z72.0] 02/02/2017  . Panic attack [F41.0]   . Chronic venous insufficiency [I87.2] 10/28/2016  . Hyponatremia [E87.1] 10/28/2016  . Hyperglycemia [R73.9]   . Intentional drug overdose (HCC) [T50.902A]   . Suicidal ideation [R45.851]   . Urinary frequency [R35.0] 04/08/2015  . Anxiety and depression [F41.9, F32.9] 12/26/2014  . Insomnia [G47.00] 12/26/2014  . Severe episode of recurrent major depressive disorder, without psychotic features (HCC) [F33.2] 08/27/2014  . Aspiration into airway [T17.908A] 12/06/2013  . Aspiration pneumonia (HCC) [J69.0] 12/06/2013  . Polysubstance abuse (HCC) [F19.10] 12/06/2013  . Diabetes mellitus (HCC) [E11.9] 12/06/2013  . Syncope [R55] 12/06/2013  . Leucocytosis [D72.829] 12/06/2013  . Acute respiratory failure (HCC) [J96.00] 12/06/2013  . Elevated LFTs [R94.5] 12/06/2013   Total  Time spent with patient: 20 minutes  Past Psychiatric History: As in H&P  Past Medical History:  Past Medical History:  Diagnosis Date  . Anxiety and depression   . Chronic back pain   . Diabetes mellitus without complication (HCC)   . Diabetic  neuropathy (HCC) 06/09/2017  . GERD (gastroesophageal reflux disease)   . Hypertension   . Panic attack 1990s  . Peripheral neuropathy   . Positive TB test   . Substance abuse (HCC)   . Substance induced mood disorder (HCC)   . UTI (lower urinary tract infection)     Past Surgical History:  Procedure Laterality Date  . ABDOMINAL HYSTERECTOMY    . CHOLECYSTECTOMY    . ROTATOR CUFF REPAIR    . TONSILLECTOMY     Family History:  Family History  Problem Relation Age of Onset  . Diabetes Mellitus II Father   . Hyperlipidemia Father   . Diabetes Father   . Colon cancer Father 36       not sure of age he was diagnosed  . Arthritis Mother   . Hypertension Mother   . Heart disease Mother   . Drug abuse Son        incarcerated with drug issues  . CAD Neg Hx    Family Psychiatric  History: As in H&P Social History:  Social History   Substance and Sexual Activity  Alcohol Use No     Social History   Substance and Sexual Activity  Drug Use Yes  . Types: Cocaine   Comment: Heroine--only used briefly before.    Social History   Socioeconomic History  . Marital status: Single    Spouse name: None  . Number of children: 2  . Years of education: 4  . Highest education level: None  Social Needs  . Financial resource strain: None  . Food insecurity - worry: None  . Food insecurity - inability: None  . Transportation needs - medical: None  . Transportation needs - non-medical: None  Occupational History  . 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 Smoking status: Current Every Day Smoker    Packs/day: 0.50    Years: 30.00    Pack years: 15.00    Types: Cigarettes  . Smokeless tobacco: Never Used  Substance and Sexual Activity  . Alcohol use: No  . Drug use: Yes    Types: Cocaine    Comment: Heroine--only used briefly before.  Marland Kitchen Sexual activity: No  Other Topics Concern  . None  Social History Narrative   Born and  raised in [place], [place].   Fun: Doesn't do a thing - never leaves the house.   Denies religious beliefs affecting health care.    Recently moved in with father for caregiver purposes   Not in touch with older son.   Younger son incarcerated for drug related reasons.   Additional Social History:      Sleep: Fair  Appetite:  Good  Current Medications: Current Facility-Administered Medications  Medication Dose Route Frequency Provider Last Rate Last Dose  . acetaminophen (TYLENOL) tablet 650 mg  650 mg Oral Q6H PRN Okonkwo, Justina A, NP   650 mg at 11/03/17 1426  . alum & mag hydroxide-simeth (MAALOX/MYLANTA) 200-200-20 MG/5ML suspension 30 mL  30 mL Oral Q4H PRN Okonkwo, Justina A, NP      . cyclobenzaprine (FLEXERIL) tablet 5 mg  5 mg Oral BID PRN Cobos, Rockey Situ,  MD   5 mg at 11/03/17 2212  . DULoxetine (CYMBALTA) DR capsule 90 mg  90 mg Oral Daily Money, Gerlene Burdockravis B, FNP   90 mg at 11/04/17 0836  . gabapentin (NEURONTIN) capsule 400 mg  400 mg Oral TID Nira ConnBerry, Jason A, NP   400 mg at 11/04/17 1204  . hydrochlorothiazide (HYDRODIURIL) tablet 25 mg  25 mg Oral Daily Nira ConnBerry, Jason A, NP   25 mg at 11/04/17 0836  . hydrOXYzine (ATARAX/VISTARIL) tablet 25 mg  25 mg Oral TID PRN Ferne Reuskonkwo, Justina A, NP   25 mg at 11/03/17 2212  . insulin aspart (novoLOG) injection 0-20 Units  0-20 Units Subcutaneous TID WC Nira ConnBerry, Jason A, NP   7 Units at 11/04/17 1201  . insulin aspart (novoLOG) injection 0-5 Units  0-5 Units Subcutaneous QHS Nira ConnBerry, Jason A, NP   2 Units at 11/03/17 2216  . insulin aspart (novoLOG) injection 4 Units  4 Units Subcutaneous TID WC Okonkwo, Justina A, NP   4 Units at 11/04/17 1203  . insulin glargine (LANTUS) injection 20 Units  20 Units Subcutaneous QHS Okonkwo, Justina A, NP   20 Units at 11/03/17 2216  . lidocaine (LIDODERM) 5 % 1 patch  1 patch Transdermal Daily Oneta RackLewis, Tanika N, NP   1 patch at 11/03/17 0810  . magnesium hydroxide (MILK OF MAGNESIA) suspension 30 mL  30 mL Oral  Daily PRN Okonkwo, Justina A, NP      . metFORMIN (GLUCOPHAGE) tablet 850 mg  850 mg Oral BID WC Nira ConnBerry, Jason A, NP   850 mg at 11/04/17 0836  . mirtazapine (REMERON) tablet 15 mg  15 mg Oral QHS Okonkwo, Justina A, NP   15 mg at 11/03/17 2212  . nicotine (NICODERM CQ - dosed in mg/24 hours) patch 21 mg  21 mg Transdermal Daily Nira ConnBerry, Jason A, NP   21 mg at 11/02/17 0907  . pantoprazole (PROTONIX) EC tablet 40 mg  40 mg Oral Daily Nira ConnBerry, Jason A, NP   40 mg at 11/04/17 [id number]    Lab Results:  Results for orders placed or performed during the hospital encounter of 10/29/17 (from the past 48 hour(s))  Glucose, capillary     Status: Abnormal   Collection Time: 11/02/17  5:34 PM  Result Value Ref Range   Glucose-Capillary 293 (H) 65 - 99 mg/dL  Urinalysis, Routine w reflex microscopic     Status: Abnormal   Collection Time: 11/02/17  7:32 PM  Result Value Ref Range   Color, Urine YELLOW YELLOW   APPearance HAZY (A) CLEAR   Specific Gravity, Urine 1.017 1.005 - 1.030   pH 6.0 5.0 - 8.0   Glucose, UA >=500 (A) NEGATIVE mg/dL   Hgb urine dipstick SMALL (A) NEGATIVE   Bilirubin Urine NEGATIVE NEGATIVE   Ketones, ur NEGATIVE NEGATIVE mg/dL   Protein, ur NEGATIVE NEGATIVE mg/dL   Nitrite NEGATIVE NEGATIVE   Leukocytes, UA LARGE (A) NEGATIVE   RBC / HPF 6-30 0 - 5 RBC/hpf   WBC, UA TOO NUMEROUS TO COUNT 0 - 5 WBC/hpf   Bacteria, UA RARE (A) NONE SEEN   Squamous Epithelial / LPF 0-5 (A) NONE SEEN   WBC Clumps PRESENT    Mucus PRESENT     Comment: Performed at [facility], [address] [place], [place] [id number]  Glucose, capillary     Status: Abnormal   Collection Time: 11/02/17  9:06 PM  Result Value Ref Range   Glucose-Capillary 234 (H) 65 -  99 mg/dL  Glucose, capillary     Status: Abnormal   Collection Time: 11/03/17  6:19 AM  Result Value Ref Range   Glucose-Capillary 179 (H) 65 - 99 mg/dL  Glucose, capillary     Status: Abnormal   Collection Time: 11/03/17  12:01 PM  Result Value Ref Range   Glucose-Capillary 259 (H) 65 - 99 mg/dL  Glucose, capillary     Status: Abnormal   Collection Time: 11/03/17  4:51 PM  Result Value Ref Range   Glucose-Capillary 221 (H) 65 - 99 mg/dL   Comment 1 Notify RN    Comment 2 Document in Chart   Glucose, capillary     Status: Abnormal   Collection Time: 11/03/17  8:54 PM  Result Value Ref Range   Glucose-Capillary 213 (H) 65 - 99 mg/dL   Comment 1 Notify RN    Comment 2 Document in Chart   Glucose, capillary     Status: Abnormal   Collection Time: 11/04/17  6:26 AM  Result Value Ref Range   Glucose-Capillary 185 (H) 65 - 99 mg/dL  Glucose, capillary     Status: Abnormal   Collection Time: 11/04/17 11:35 AM  Result Value Ref Range   Glucose-Capillary 213 (H) 65 - 99 mg/dL   Comment 1 Notify RN    Comment 2 Document in Chart   Glucose, capillary     Status: Abnormal   Collection Time: 11/04/17  4:51 PM  Result Value Ref Range   Glucose-Capillary 255 (H) 65 - 99 mg/dL    Blood Alcohol level:  Lab Results  Component Value Date   ETH <10 10/27/2017   ETH <5 03/15/2017    Metabolic Disorder Labs: Lab Results  Component Value Date   HGBA1C 10.0 (H) 03/21/2017   MPG 240 03/21/2017   MPG 298 10/29/2016   No results found for: PROLACTIN No results found for: CHOL, TRIG, HDL, CHOLHDL, VLDL, LDLCALC  Physical Findings: AIMS: Facial and Oral Movements Muscles of Facial Expression: None, normal Lips and Perioral Area: None, normal Jaw: None, normal Tongue: None, normal,Extremity Movements Upper (arms, wrists, hands, fingers): None, normal Lower (legs, knees, ankles, toes): None, normal, Trunk Movements Neck, shoulders, hips: None, normal, Overall Severity Severity of abnormal movements (highest score from questions above): None, normal Incapacitation due to abnormal movements: None, normal Patient's awareness of abnormal movements (rate only patient's report): No Awareness, Dental  Status Current problems with teeth and/or dentures?: No Does patient usually wear dentures?: No  CIWA:    COWS:     Musculoskeletal: Strength & Muscle Tone: within normal limits Gait & Station: normal Patient leans: N/A  Psychiatric Specialty Exam: Physical Exam  Constitutional: She appears well-developed and well-nourished.  HENT:  Head: Normocephalic and atraumatic.  Respiratory: Effort normal.  Neurological: She is alert.  Psychiatric:  As above    ROS  Blood pressure 107/76, pulse (!) 103, temperature (!) 97.4 F (36.3 C), temperature source Oral, resp. rate 16, height 5\' 9"  (1.753 m), weight 130.2 kg (287 lb).Body mass index is 42.38 kg/m.  General Appearance: Neatly dressed, pleasant, engaging well and cooperative. Appropriate behavior. .  Eye Contact:  Good  Speech:  Spontaneous, normal prosody. Normal tone and rate.   Volume:  Normal  Mood:  Frustrated but denies being depressed.   Affect:  Full range and mod congruent.   Thought Process:  Linear  Orientation:  Full (Time, Place, and Person)  Thought Content:  Very focused on being discharged. Not processing relapse preventive  measures.    Suicidal Thoughts:  No  Homicidal Thoughts:  No  Memory:  Immediate;   Good Recent;   Good Remote;   Good  Judgement:  Fair  Insight:  Partial as she is not motivated to deal with her addiction at this time.   Psychomotor Activity:  Normal  Concentration:  Concentration: Good and Attention Span: Good  Recall:  Good  Fund of Knowledge:  Good  Language:  Good  Akathisia:  Negative  Handed:    AIMS (if indicated):     Assets:  Communication Skills Housing Resilience  ADL's:  Intact  Cognition:  WNL  Sleep:  Number of Hours: 4.25     Treatment Plan Summary: Patient is not depressed. She is not manic or psychotic. She is denying any cravings but at the same time not processing ways she could stay sober. She is very focused on discharge. We got collateral from her father  but he has limitations as he has dementia. We plan to evaluate her further and seek other supports in the community for her.   Psychiatric: SUD SIMD MDD  Medical: DM HTN  Psychosocial:  Unemployed Limited support Medical issues Family dynamics Bereavement     PLAN: 1.Continue current regimen 2. Continue to motivate patient towards measures to stay sober 3. Continue to monitor mood, behavior and interaction with peers 4. Continue to encourage unit groups and therapeutic activity    Georgiann Cocker, MD 11/04/2017, 5:18 PMPatient ID: Glori Bickers, female   DOB: [birth year], 52 y.o.   MRN: [id number]

## 2017-11-04 NOTE — Progress Notes (Signed)
D.  Pt anxious on approach, requests another dose of Ativan for her agitation and anxiety.  Pt did attend evening AA group, and has been observed in dayroom interacting with peers on the unit.  Pt had become agitated earlier in the day -punching her bed and screaming- and received Ativan for this.  Pt denies SI/HI/AVH at this time.  A.  Spoke with NP and order received for Ativan one time dose.  Support and encouragement offered to patient.  R.  Pt remains safe on the unit, will continue to monitor.

## 2017-11-04 NOTE — Progress Notes (Signed)
Recreation Therapy Notes  Date:  11/04/17 Time: 0930 Location: [address] Dayroom  Group Topic: Stress Management  Goal Area(s) Addresses:  Patient will verbalize importance of using healthy stress management.  Patient will identify positive emotions associated with healthy stress management.   Intervention: Stress Managemnt  Activity :  Meditation.  LRT introduced the stress management technique of meditation.  LRT played a meditation on the stillness and resilience of mountains and how it can be used in mindfulness to ground and center you during the meditation.  Education:  Stress Management, Discharge Planning.   Education Outcome: Acknowledges edcuation/In group clarification offered/Needs additional education  Clinical Observations/Feedback: Pt did not attend group.     Caroll RancherMarjette Toshua Honsinger, LRT/CTRS         Caroll RancherLindsay, Toneka Fullen A 11/04/2017 11:34 AM

## 2017-11-04 NOTE — Progress Notes (Signed)
D: Patient has been irritable and agitated today due to no discharge today.  Patient does not understand why she is being "held her for no reason."  Patient will then state, "I understand why everyone is concerned."  Patient does understand the severity of her overdose and states, "I understand why he would want to keep me."  Overhead patient in her room yelling and punching her bed and pillows.  Attempted to console patient and she requested to see the MD.  Informed her I would speak to the MD for her.  Patient came up to med window and stated, "I need any ativan and not a 0.5 mg."  MD was informed and order was given.  She was given 0.1 mg ativan for agitation.  It was decided that patient was not safe to return home today due to minimal collaboration from family.  Patient could not give Child psychotherapistsocial worker or nurse a valid number for an alternate family member to be called.  Patient did request that her phone be charged and staff found her a Consulting civil engineercharger.  When she was given her phone she stated, "I don't have any numbers to call."   Patient has been calmer this afternoon and has accepted the idea that she will not be discharged.  She denies any thoughts of self harm today.    A: Continue to monitor medication management and MD orders.  Safety checks continued every 15 minutes per protocol.  Offer support and encouragement as needed.  R: Patient is receptive to staff; her agitation has decreased.

## 2017-11-04 NOTE — Tx Team (Signed)
Interdisciplinary Treatment and Diagnostic Plan Update  11/04/2017 Time of Session: 0830AM Claudia Erickson MRN: [id number]  Principal Diagnosis: MDD  Secondary Diagnoses: Principal Problem:   MDD (major depressive disorder), severe (HCC) Active Problems:   Vaginal candidiasis   Current Medications:  Current Facility-Administered Medications  Medication Dose Route Frequency Provider Last Rate Last Dose  . acetaminophen (TYLENOL) tablet 650 mg  650 mg Oral Q6H PRN Okonkwo, Justina A, NP   650 mg at 11/03/17 1426  . alum & mag hydroxide-simeth (MAALOX/MYLANTA) 200-200-20 MG/5ML suspension 30 mL  30 mL Oral Q4H PRN Okonkwo, Justina A, NP      . cyclobenzaprine (FLEXERIL) tablet 5 mg  5 mg Oral BID PRN Cobos, Rockey Situ, MD   5 mg at 11/03/17 2212  . DULoxetine (CYMBALTA) DR capsule 90 mg  90 mg Oral Daily Money, Gerlene Burdock, FNP   90 mg at 11/04/17 0836  . fluconazole (DIFLUCAN) tablet 150 mg  150 mg Oral Q3 days Money, Gerlene Burdock, FNP   150 mg at 11/01/17 1316  . gabapentin (NEURONTIN) capsule 400 mg  400 mg Oral TID Nira Conn A, NP   400 mg at 11/04/17 0835  . hydrochlorothiazide (HYDRODIURIL) tablet 25 mg  25 mg Oral Daily Nira Conn A, NP   25 mg at 11/04/17 0836  . hydrOXYzine (ATARAX/VISTARIL) tablet 25 mg  25 mg Oral TID PRN Ferne Reus A, NP   25 mg at 11/03/17 2212  . insulin aspart (novoLOG) injection 0-20 Units  0-20 Units Subcutaneous TID WC Nira Conn A, NP   4 Units at 11/04/17 0636  . insulin aspart (novoLOG) injection 0-5 Units  0-5 Units Subcutaneous QHS Nira Conn A, NP   2 Units at 11/03/17 2216  . insulin aspart (novoLOG) injection 4 Units  4 Units Subcutaneous TID WC Okonkwo, Justina A, NP   4 Units at 11/04/17 7829  . insulin glargine (LANTUS) injection 20 Units  20 Units Subcutaneous QHS Okonkwo, Justina A, NP   20 Units at 11/03/17 2216  . lidocaine (LIDODERM) 5 % 1 patch  1 patch Transdermal Daily Oneta Rack, NP   1 patch at 11/03/17 0810  . magnesium  hydroxide (MILK OF MAGNESIA) suspension 30 mL  30 mL Oral Daily PRN Okonkwo, Justina A, NP      . metFORMIN (GLUCOPHAGE) tablet 850 mg  850 mg Oral BID WC Nira Conn A, NP   850 mg at 11/04/17 0836  . mirtazapine (REMERON) tablet 15 mg  15 mg Oral QHS Okonkwo, Justina A, NP   15 mg at 11/03/17 2212  . nicotine (NICODERM CQ - dosed in mg/24 hours) patch 21 mg  21 mg Transdermal Daily Nira Conn A, NP   21 mg at 11/02/17 0907  . pantoprazole (PROTONIX) EC tablet 40 mg  40 mg Oral Daily Nira Conn A, NP   40 mg at 11/04/17 5621   PTA Medications: Medications Prior to Admission  Medication Sig Dispense Refill Last Dose  . citalopram (CELEXA) 40 MG tablet Take 1 tablet (40 mg total) by mouth daily. For depression 30 tablet 11 10/24/2017  . gabapentin (NEURONTIN) 400 MG capsule Take 1 capsule (400 mg total) by mouth 3 (three) times daily. Increase by 1 every 3 days as tolerated until taking 2 three times a day.For agitation/Neuropathic pain 180 capsule 1 unknown  . hydrochlorothiazide (HYDRODIURIL) 25 MG tablet Take 1 tablet (25 mg total) by mouth daily. For high blood pressure 30 tablet 1 10/25/2017  .  insulin aspart protamine- aspart (NOVOLOG MIX 70/30) (70-30) 100 UNIT/ML injection Inject 40 units into the skin with breakfast & 35 units in to the Skin at supper: For diabetes management (Patient taking differently: Inject 40 units into the skin with breakfast & 30 units in to the Skin at supper: For diabetes management) 10 mL 1 unknown  . LORazepam (ATIVAN) 1 MG tablet 1/2-1 tablet by mouth as needed 10 tablet 0 Past Week at Unknown time  . metFORMIN (GLUCOPHAGE) 850 MG tablet Take 1 tablet (850 mg total) by mouth 2 (two) times daily with a meal. For diabetes management 60 tablet 0 10/25/2017  . mirtazapine (REMERON) 15 MG tablet Take 1 tablet (15 mg total) by mouth at bedtime. For depression/sleep 30 tablet 11 10/25/2017  . nicotine (NICODERM CQ - DOSED IN MG/24 HOURS) 21 mg/24hr patch Place 1 patch  (21 mg total) onto the skin daily. For smoking cessation (Patient not taking: Reported on 04/13/2017) 28 patch 0 Not Taking at Unknown time  . omeprazole (PRILOSEC OTC) 20 MG tablet Take 1 tablet (20 mg total) by mouth daily. For acid reflux 30 tablet 0 10/26/2017 at Unknown time    Patient Stressors: Financial difficulties Health problems Marital or family conflict Medication change or noncompliance Substance abuse  Patient Strengths: Dentist for treatment/growth  Treatment Modalities: Medication Management, Group therapy, Case management,  1 to 1 session with clinician, Psychoeducation, Recreational therapy.   Physician Treatment Plan for Primary Diagnosis: MDD Long Term Goal(s): Improvement in symptoms so as ready for discharge Improvement in symptoms so as ready for discharge   Short Term Goals: Ability to identify changes in lifestyle to reduce recurrence of condition will improve Ability to verbalize feelings will improve Ability to disclose and discuss suicidal ideas Ability to demonstrate self-control will improve Ability to identify and develop effective coping behaviors will improve Ability to maintain clinical measurements within normal limits will improve Compliance with prescribed medications will improve Ability to verbalize feelings will improve Ability to disclose and discuss suicidal ideas Ability to demonstrate self-control will improve Ability to identify and develop effective coping behaviors will improve  Medication Management: Evaluate patient's response, side effects, and tolerance of medication regimen.  Therapeutic Interventions: 1 to 1 sessions, Unit Group sessions and Medication administration.  Evaluation of Outcomes: Progressing  Physician Treatment Plan for Secondary Diagnosis: Principal Problem:   MDD (major depressive disorder), severe (HCC) Active Problems:   Vaginal candidiasis  Long Term  Goal(s): Improvement in symptoms so as ready for discharge Improvement in symptoms so as ready for discharge   Short Term Goals: Ability to identify changes in lifestyle to reduce recurrence of condition will improve Ability to verbalize feelings will improve Ability to disclose and discuss suicidal ideas Ability to demonstrate self-control will improve Ability to identify and develop effective coping behaviors will improve Ability to maintain clinical measurements within normal limits will improve Compliance with prescribed medications will improve Ability to verbalize feelings will improve Ability to disclose and discuss suicidal ideas Ability to demonstrate self-control will improve Ability to identify and develop effective coping behaviors will improve     Medication Management: Evaluate patient's response, side effects, and tolerance of medication regimen.  Therapeutic Interventions: 1 to 1 sessions, Unit Group sessions and Medication administration.  Evaluation of Outcomes: Progressing   RN Treatment Plan for Primary Diagnosis: MDD Long Term Goal(s): Knowledge of disease and therapeutic regimen to maintain health will improve  Short Term Goals: Ability to remain  free from injury will improve, Ability to disclose and discuss suicidal ideas and Ability to identify and develop effective coping behaviors will improve  Medication Management: RN will administer medications as ordered by provider, will assess and evaluate patient's response and provide education to patient for prescribed medication. RN will report any adverse and/or side effects to prescribing provider.  Therapeutic Interventions: 1 on 1 counseling sessions, Psychoeducation, Medication administration, Evaluate responses to treatment, Monitor vital signs and CBGs as ordered, Perform/monitor CIWA, COWS, AIMS and Fall Risk screenings as ordered, Perform wound care treatments as ordered.  Evaluation of Outcomes:  Progressing   LCSW Treatment Plan for Primary Diagnosis:MDD Long Term Goal(s): Safe transition to appropriate next level of care at discharge, Engage patient in therapeutic group addressing interpersonal concerns.  Short Term Goals: Engage patient in aftercare planning with referrals and resources, Facilitate patient progression through stages of change regarding substance use diagnoses and concerns and Identify triggers associated with mental health/substance abuse issues  Therapeutic Interventions: Assess for all discharge needs, 1 to 1 time with Social worker, Explore available resources and support systems, Assess for adequacy in community support network, Educate family and significant other(s) on suicide prevention, Complete Psychosocial Assessment, Interpersonal group therapy.  Evaluation of Outcomes: Progressing   Progress in Treatment: Attending groups: Yes. Participating in groups: Yes. Taking medication as prescribed: Yes. Toleration medication: Yes. Family/Significant other contact made: SPE completed with pt's father.  Patient understands diagnosis: Yes. Discussing patient identified problems/goals with staff: Yes. Medical problems stabilized or resolved: Yes. Denies suicidal/homicidal ideation: Yes. Issues/concerns per patient self-inventory: No. Other: n/a   New problem(s) identified: No, Describe:  n/a  New Short Term/Long Term Goal(s): detox, medication management for mood stabilization; elimination of SI thoughts; development of comprehensive mental wellness/sobriety plan.    Discharge Plan or Barriers: Pt plans to return home; [facility] and peer support.   Reason for Continuation of Hospitalization: medication management   Estimated Length of Stay: Saturday 11/05/17  Attendees: Patient: 11/04/2017 8:48 AM  Physician: Dr. Jackquline BerlinIzediuno MD; Dr. Altamese Carolinaainville MD 11/04/2017 8:48 AM  Nursing:  Rayfield Citizenaroline RN; Erskine SquibbJane RN 11/04/2017 8:48 AM  RN Care Manager: Onnie BoerJennifer Clark CM 11/04/2017 8:48 AM  Social Worker: Trula SladeHeather Smart, LCSW 11/04/2017 8:48 AM  Recreational Therapist: x 11/04/2017 8:48 AM  Other: Armandina StammerAgnes Nwoko NP; Feliz Beamravis Money NP 11/04/2017 8:48 AM  Other:  11/04/2017 8:48 AM  Other: 11/04/2017 8:48 AM    Scribe for Treatment Team: Ledell PeoplesHeather N Smart, LCSW 11/04/2017 8:48 AM

## 2017-11-05 LAB — GLUCOSE, CAPILLARY
Glucose-Capillary: 182 mg/dL — ABNORMAL HIGH (ref 65–99)
Glucose-Capillary: 244 mg/dL — ABNORMAL HIGH (ref 65–99)
Glucose-Capillary: 276 mg/dL — ABNORMAL HIGH (ref 65–99)
Glucose-Capillary: 254 mg/dL — ABNORMAL HIGH (ref 65–99)

## 2017-11-05 MED ORDER — LORAZEPAM 1 MG PO TABS
1.0000 mg | ORAL_TABLET | Freq: Once | ORAL | Status: AC | PRN
  Administered 2017-11-05: 1 mg via ORAL
  Filled 2017-11-05: qty 1

## 2017-11-05 MED ORDER — MIRTAZAPINE 30 MG PO TABS
30.0000 mg | ORAL_TABLET | Freq: Every day | ORAL | Status: DC
  Administered 2017-11-05 – 2017-11-06 (×2): 30 mg via ORAL
  Filled 2017-11-05 (×2): qty 1
  Filled 2017-11-05 (×3): qty 7

## 2017-11-05 NOTE — [facility] Group Notes (Signed)
LCSW Group Therapy Note  11/05/2017 9:30-10:30AM  Type of Therapy and Topic:  Group Therapy: Anger Cues and Responses  Participation Level:  Active   Description of Group:   In this group, patients learned how to recognize the physical, cognitive, emotional, and behavioral responses they have to anger-provoking situations.  They identified a recent time they became angry and how they reacted.  They analyzed how their reaction was possibly beneficial and how it was possibly unhelpful.  The group dicussed a variety of healthier coping skills that could help with such a situation in the future.  Deep breathing was practiced briefly.  Therapeutic Goals: 1. Patients will remember their last incident of anger and how they felt emotionally and physically, what their thoughts were at the time, and how they behaved. 2. Patients will identify how their behavior at that time worked for them, as well as how it worked against them. 3. Patients will explore possible new behaviors to use in future anger situations. 4. Patients will learn that anger itself is normal and cannot be eliminated, and that healthier reactions can assist with resolving conflict rather than worsening situations.  Summary of Patient Progress:  The patient shared that their most recent time of anger was yesterday when she thought she would leave the hospital, but was informed by staff that this would not happen.  She said she rarely becomes angry so did not know what to do, was screaming and hitting her pillow on the wall.  She was able to acknowledge how this did not help her situation, and several things she could do in a similar situation that would produce improved results.  Therapeutic Modalities:   Cognitive Behavioral Therapy  Lynnell ChadMareida J Grossman-Orr  11/05/2017 12:37 PM

## 2017-11-05 NOTE — [facility] Group Notes (Addendum)
[facility] Group Notes:  (Nursing Education)  Date:  11/05/2017  Time:  3:27 PM  Type of Therapy:  Nurse Education  Participation Level:  Active  Participation Quality:  Appropriate and Attentive  Affect:  Appropriate  Cognitive:  Alert and Appropriate  Insight:  Appropriate  Engagement in Group:  Engaged  Modes of Intervention:  Discussion and Education  Summary of Progress/Problems: Discussed anger management- went through adult unit workbook. Pt was actively engaged in discussion.  Shela NevinValerie S Naomi Fitton 11/05/2017, 3:27 PM

## 2017-11-05 NOTE — Progress Notes (Signed)
D. Pt presents with an anxious affect and behavior. Pt observed attending am group led by SW. Per pt's self inventory, pt rates her depression, hopelessness and anxiety a 4/0/10, respectively. Pt writes that her goal today is "making it to every group". .Pt currently denies SI/HI and AVH.  A. Labs and vitals monitored. Pt compliant with  medications. Pt supported emotionally and encouraged to express concerns and ask questions.   R. Pt remains safe with 15 minute checks. Will continue POC.

## 2017-11-05 NOTE — Progress Notes (Signed)
[facility]  11/05/2017 3:00 PM Claudia Erickson  MRN:  [id number]   Subjective:  Claudia Erickson reports " I am just meditating today so that I don't get stressed or overwhelmed."  Objective: Claudia Erickson is awake, alert and oriented seen resting in bed. Reports attending all group session. States " I don't know what to do with myself during the down times here."  Denies suicidal or homicidal ideation.  Reports more depression thinking about "my life" reports she doesn't have a very good relationship with her children. Denies auditory or visual hallucination and does not appear to be responding to internal stimuli.  Patient reports she is medication compliant without mediation side effects. States her medications was recently adjusted from Prozac to Cymbalta . Reports good appetite and states she is not resting well during the night, has she is having a difficult time going to sleep. Encouraged staying awake during day hours and taking night medication early.  Support, encouragement and reassurance was provided.    Principal Problem: MDD (major depressive disorder), severe (HCC) Diagnosis:   Patient Active Problem List   Diagnosis Date Noted  . Vaginal candidiasis [B37.3] 11/01/2017  . MDD (major depressive disorder), severe (HCC) [F32.2] 10/29/2017  . Overdose [T50.901A] 10/27/2017  . Diabetic neuropathy (HCC) [E11.40] 06/09/2017  . Cocaine abuse (HCC) [F14.10] 03/16/2017  . Tobacco abuse [Z72.0] 02/02/2017  . Panic attack [F41.0]   . Chronic venous insufficiency [I87.2] 10/28/2016  . Hyponatremia [E87.1] 10/28/2016  . Hyperglycemia [R73.9]   . Intentional drug overdose (HCC) [T50.902A]   . Suicidal ideation [R45.851]   . Urinary frequency [R35.0] 04/08/2015  . Anxiety and depression [F41.9, F32.9] 12/26/2014  . Insomnia [G47.00] 12/26/2014  . Severe episode of recurrent major depressive disorder, without psychotic features (HCC) [F33.2] 08/27/2014  . Aspiration into airway  [T17.908A] 12/06/2013  . Aspiration pneumonia (HCC) [J69.0] 12/06/2013  . Polysubstance abuse (HCC) [F19.10] 12/06/2013  . Diabetes mellitus (HCC) [E11.9] 12/06/2013  . Syncope [R55] 12/06/2013  . Leucocytosis [D72.829] 12/06/2013  . Acute respiratory failure (HCC) [J96.00] 12/06/2013  . Elevated LFTs [R94.5] 12/06/2013   Total Time spent with patient: 20 minutes  Past Psychiatric History: As in H&P  Past Medical History:  Past Medical History:  Diagnosis Date  . Anxiety and depression   . Chronic back pain   . Diabetes mellitus without complication (HCC)   . Diabetic neuropathy (HCC) 06/09/2017  . GERD (gastroesophageal reflux disease)   . Hypertension   . Panic attack 1990s  . Peripheral neuropathy   . Positive TB test   . Substance abuse (HCC)   . Substance induced mood disorder (HCC)   . UTI (lower urinary tract infection)     Past Surgical History:  Procedure Laterality Date  . ABDOMINAL HYSTERECTOMY    . CHOLECYSTECTOMY    . ROTATOR CUFF REPAIR    . TONSILLECTOMY     Family History:  Family History  Problem Relation Age of Onset  . Diabetes Mellitus II Father   . Hyperlipidemia Father   . Diabetes Father   . Colon cancer Father 1765       not sure of age he was diagnosed  . Arthritis Mother   . Hypertension Mother   . Heart disease Mother   . Drug abuse Son        incarcerated with drug issues  . CAD Neg Hx    Family Psychiatric  History: As in H&P Social History:  Social History   Substance and  Sexual Activity  Alcohol Use No     Social History   Substance and Sexual Activity  Drug Use Yes  . Types: Cocaine   Comment: Heroine--only used briefly before.    Social History   Socioeconomic History  . Marital status: Single    Spouse name: None  . Number of children: 2  . Years of education: 85  . Highest education level: None  Social Needs  . Financial resource strain: None  . Food insecurity - worry: None  . Food insecurity - inability: None   . Transportation needs - medical: None  . Transportation needs - non-medical: None  Occupational History  . 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 Smoking status: Current Every Day Smoker    Packs/day: 0.50    Years: 30.00    Pack years: 15.00    Types: Cigarettes  . Smokeless tobacco: Never Used  Substance and Sexual Activity  . Alcohol use: No  . Drug use: Yes    Types: Cocaine    Comment: Heroine--only used briefly before.  Marland Kitchen Sexual activity: No  Other Topics Concern  . None  Social History Narrative   Born and raised in [place], [place].   Fun: Doesn't do a thing - never leaves the house.   Denies religious beliefs affecting health care.    Recently moved in with father for caregiver purposes   Not in touch with older son.   Younger son incarcerated for drug related reasons.   Additional Social History:      Sleep: Fair  Appetite:  Good  Current Medications: Current Facility-Administered Medications  Medication Dose Route Frequency Provider Last Rate Last Dose  . acetaminophen (TYLENOL) tablet 650 mg  650 mg Oral Q6H PRN Okonkwo, Justina A, NP   650 mg at 11/03/17 1426  . alum & mag hydroxide-simeth (MAALOX/MYLANTA) 200-200-20 MG/5ML suspension 30 mL  30 mL Oral Q4H PRN Okonkwo, Justina A, NP      . cyclobenzaprine (FLEXERIL) tablet 5 mg  5 mg Oral BID PRN Cobos, Rockey Situ, MD   5 mg at 11/04/17 2126  . DULoxetine (CYMBALTA) DR capsule 90 mg  90 mg Oral Daily Money, Gerlene Burdock, FNP   90 mg at 11/05/17 0841  . gabapentin (NEURONTIN) capsule 400 mg  400 mg Oral TID Nira Conn A, NP   400 mg at 11/05/17 1207  . hydrochlorothiazide (HYDRODIURIL) tablet 25 mg  25 mg Oral Daily Nira Conn A, NP   25 mg at 11/05/17 8119  . hydrOXYzine (ATARAX/VISTARIL) tablet 25 mg  25 mg Oral TID PRN Ferne Reus A, NP   25 mg at 11/04/17 2125  . insulin aspart (novoLOG) injection 0-20 Units  0-20 Units Subcutaneous TID WC Nira Conn A, NP   7 Units at 11/05/17 1209  . insulin aspart (novoLOG) injection 0-5 Units  0-5 Units Subcutaneous QHS Nira Conn A, NP   2 Units at 11/04/17 2126  . insulin aspart (novoLOG) injection 4 Units  4 Units Subcutaneous TID WC Okonkwo, Justina A, NP   4 Units at 11/05/17 1209  . insulin glargine (LANTUS) injection 20 Units  20 Units Subcutaneous QHS Okonkwo, Justina A, NP   20 Units at 11/04/17 2128  . lidocaine (LIDODERM) 5 % 1 patch  1 patch Transdermal Daily Oneta Rack, NP   1 patch at 11/03/17 0810  . magnesium hydroxide (MILK OF MAGNESIA) suspension 30 mL  30 mL  Oral Daily PRN Okonkwo, Justina A, NP      . metFORMIN (GLUCOPHAGE) tablet 850 mg  850 mg Oral BID WC Nira Conn A, NP   850 mg at 11/05/17 0841  . mirtazapine (REMERON) tablet 15 mg  15 mg Oral QHS Okonkwo, Justina A, NP   15 mg at 11/04/17 2125  . nicotine (NICODERM CQ - dosed in mg/24 hours) patch 21 mg  21 mg Transdermal Daily Nira Conn A, NP   21 mg at 11/02/17 0907  . pantoprazole (PROTONIX) EC tablet 40 mg  40 mg Oral Daily Nira Conn A, NP   40 mg at 11/05/17 6578    Lab Results:  Results for orders placed or performed during the hospital encounter of 10/29/17 (from the past 48 hour(s))  Glucose, capillary     Status: Abnormal   Collection Time: 11/03/17  4:51 PM  Result Value Ref Range   Glucose-Capillary 221 (H) 65 - 99 mg/dL   Comment 1 Notify RN    Comment 2 Document in Chart   Glucose, capillary     Status: Abnormal   Collection Time: 11/03/17  8:54 PM  Result Value Ref Range   Glucose-Capillary 213 (H) 65 - 99 mg/dL   Comment 1 Notify RN    Comment 2 Document in Chart   Glucose, capillary     Status: Abnormal   Collection Time: 11/04/17  6:26 AM  Result Value Ref Range   Glucose-Capillary 185 (H) 65 - 99 mg/dL  Glucose, capillary     Status: Abnormal   Collection Time: 11/04/17 11:35 AM  Result Value Ref Range   Glucose-Capillary 213 (H) 65 - 99 mg/dL   Comment 1 Notify RN    Comment  2 Document in Chart   Glucose, capillary     Status: Abnormal   Collection Time: 11/04/17  4:51 PM  Result Value Ref Range   Glucose-Capillary 255 (H) 65 - 99 mg/dL  Glucose, capillary     Status: Abnormal   Collection Time: 11/04/17  9:18 PM  Result Value Ref Range   Glucose-Capillary 203 (H) 65 - 99 mg/dL   Comment 1 Notify RN    Comment 2 Document in Chart   Glucose, capillary     Status: Abnormal   Collection Time: 11/05/17  5:55 AM  Result Value Ref Range   Glucose-Capillary 182 (H) 65 - 99 mg/dL   Comment 1 Notify RN    Comment 2 Document in Chart   Glucose, capillary     Status: Abnormal   Collection Time: 11/05/17 11:48 AM  Result Value Ref Range   Glucose-Capillary 244 (H) 65 - 99 mg/dL    Blood Alcohol level:  Lab Results  Component Value Date   ETH <10 10/27/2017   ETH <5 03/15/2017    Metabolic Disorder Labs: Lab Results  Component Value Date   HGBA1C 10.0 (H) 03/21/2017   MPG 240 03/21/2017   MPG 298 10/29/2016   No results found for: PROLACTIN No results found for: CHOL, TRIG, HDL, CHOLHDL, VLDL, LDLCALC  Physical Findings: AIMS: Facial and Oral Movements Muscles of Facial Expression: None, normal Lips and Perioral Area: None, normal Jaw: None, normal Tongue: None, normal,Extremity Movements Upper (arms, wrists, hands, fingers): None, normal Lower (legs, knees, ankles, toes): None, normal, Trunk Movements Neck, shoulders, hips: None, normal, Overall Severity Severity of abnormal movements (highest score from questions above): None, normal Incapacitation due to abnormal movements: None, normal Patient's awareness of abnormal movements (rate only patient's  report): No Awareness, Dental Status Current problems with teeth and/or dentures?: No Does patient usually wear dentures?: No  CIWA:    COWS:     Musculoskeletal: Strength & Muscle Tone: within normal limits Gait & Station: normal Patient leans: N/A  Psychiatric Specialty Exam: Physical  Exam  Constitutional: She appears well-developed and well-nourished.  HENT:  Head: Normocephalic and atraumatic.  Respiratory: Effort normal.  Neurological: She is alert.  Psychiatric: She has a normal mood and affect. Her behavior is normal.  As above    ROS  Blood pressure 126/82, pulse 98, temperature 97.9 F (36.6 C), temperature source Oral, resp. rate 16, height 5\' 9"  (1.753 m), weight 130.2 kg (287 lb).Body mass index is 42.38 kg/m.  General Appearance: Casual   Eye Contact:  Good  Speech:  Spontaneous, clear and coherent    Volume:  Normal  Mood: reports depression and feeling overwhelm    Affect:  Full range and mod congruent.   Thought Process:  Linear  Orientation:  Full (Time, Place, and Person)  Thought Content:  Liner   Suicidal Thoughts:  No  Homicidal Thoughts:  No  Memory:  Immediate;   Good Recent;   Good Remote;   Good  Judgement:  Fair  Insight:  Partial as she is not motivated to deal with her addiction at this time.   Psychomotor Activity:  Normal  Concentration:  Concentration: Good and Attention Span: Good  Recall:  Good  Fund of Knowledge:  Good  Language:  Good  Akathisia:  Negative  Handed:    AIMS (if indicated):     Assets:  Communication Skills Desire for Improvement Housing Resilience Social Support  ADL's:  Intact  Cognition:  WNL  Sleep:  Number of Hours: 3.5    Treatment Plan Summary: Daily contact with patient to assess and evaluate symptoms and progress in treatment and Medication management  Continue with current treatment plan on 11/05/2017 except where noted  Continue with Cymbalta 90 mg  for mood stabilization. Increased  with Remeron 15 mg to  30 mg  for insomnia  Will continue to monitor vitals ,medication compliance and treatment side effects while patient is here.  CSW will continue working on disposition.  Patient to participate in therapeutic  milieu  Psychiatric: SUD SIMD MDD  Medical: DM HTN  Psychosocial:  Unemployed Limited support Medical issues Family dynamics Bereavement   PLAN: 1.Continue current regimen 2. Continue to motivate patient towards measures to stay sober 3. Continue to monitor mood, behavior and interaction with peers 4. Continue to encourage unit groups and therapeutic activity    Oneta Rack, NP 11/05/2017, 3:00 PM

## 2017-11-06 LAB — GLUCOSE, CAPILLARY
GLUCOSE-CAPILLARY: 194 mg/dL — AB (ref 65–99)
Glucose-Capillary: 206 mg/dL — ABNORMAL HIGH (ref 65–99)
Glucose-Capillary: 185 mg/dL — ABNORMAL HIGH (ref 65–99)
Glucose-Capillary: 296 mg/dL — ABNORMAL HIGH (ref 65–99)

## 2017-11-06 MED ORDER — DULOXETINE HCL 30 MG PO CPEP: 90.0000 mg | ORAL_CAPSULE | Freq: Every day | ORAL | 0 refills | Status: AC

## 2017-11-06 MED ORDER — NICOTINE 21 MG/24HR TD PT24: 21.0000 mg | MEDICATED_PATCH | Freq: Every day | TRANSDERMAL | 0 refills | Status: AC

## 2017-11-06 MED ORDER — MIRTAZAPINE 30 MG PO TABS: 30.0000 mg | ORAL_TABLET | Freq: Every day | ORAL | 0 refills | Status: AC

## 2017-11-06 MED ORDER — GABAPENTIN 400 MG PO CAPS: 400.0000 mg | ORAL_CAPSULE | Freq: Three times a day (TID) | ORAL | 0 refills | Status: AC

## 2017-11-06 NOTE — Progress Notes (Addendum)
Patient observed sitting in dayroom eating dinner. Pt reports feeling much better, but still feels 'malaise'.

## 2017-11-06 NOTE — [facility] Group Notes (Signed)
[facility] Group Notes:  (Nursing)  Date:  11/06/2017  Time:  2:31 PM  Type of Therapy:  Nurse Education  Participation Level:  Did Not Attend  Participation Quality:  did not attend  Affect:  did not attend  Cognitive:  did not attend  Insight:  None  Engagement in Group:  did not attend  Modes of Intervention:  Discussion and Education  Summary of Progress/Problems: Did not attend  Shela NevinValerie S Kenslei Hearty 11/06/2017, 2:31 PM

## 2017-11-06 NOTE — Discharge Summary (Signed)
Physician Discharge Summary Note  Patient:  Claudia Erickson is an 52 y.o., female MRN:  [id number] DOB:  April 22, 1966 Patient phone:  [phone] (home)  Patient address:   [address] [place] [address],  Total Time spent with patient: 30 minutes  Date of Admission:  10/29/2017 Date of Discharge: 11/06/2017  Reason for Admission:Per HPI- Patient is a 52 year old female. She was admitted on 1/24 after being found unresponsive in a hotel room. She had written a suicide note. Patient states she had overdosed on fentanyl  and states she had also taken " a bunch of Klonopin". States " I was very intent on killing myself". Of note, denies  opiate/fentanyl  or benzodiazepine abuse , stated she had purchased the Fentanyl and Klonopin  for the purpose of suicide attempt.  Patient states that her memory of the event is limited. States " last thing I remember using crack, fentanyl and then the next thing I remember is being in the hospital".  She was initially admitted to inpatient medical unit for medical stabilization and management. Reports history of depression and had been feeling more depressed over recent weeks, with woresening suicidal ideations. 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 She also states that she had stopped her psychiatric medications (Celexa, Remeron) a few weeks ago due to insurance constraints preventing her from returning to her outpatient clinic . Endorses neuro-vegetative symptoms of depression as below. Denies psychotic symptoms.      Principal Problem: MDD (major depressive disorder), severe [facility]) Discharge Diagnoses: Patient Active Problem List   Diagnosis Date Noted  . Vaginal candidiasis [B37.3] 11/01/2017  . MDD (major depressive disorder), severe (HCC) [F32.2] 10/29/2017  . Overdose [T50.901A] 10/27/2017  . Diabetic  neuropathy (HCC) [E11.40] 06/09/2017  . Cocaine abuse (HCC) [F14.10] 03/16/2017  . Tobacco abuse [Z72.0] 02/02/2017  . Panic attack [F41.0]   . Chronic venous insufficiency [I87.2] 10/28/2016  . Hyponatremia [E87.1] 10/28/2016  . Hyperglycemia [R73.9]   . Intentional drug overdose (HCC) [T50.902A]   . Suicidal ideation [R45.851]   . Urinary frequency [R35.0] 04/08/2015  . Anxiety and depression [F41.9, F32.9] 12/26/2014  . Insomnia [G47.00] 12/26/2014  . Severe episode of recurrent major depressive disorder, without psychotic features (HCC) [F33.2] 08/27/2014  . Aspiration into airway [T17.908A] 12/06/2013  . Aspiration pneumonia (HCC) [J69.0] 12/06/2013  . Polysubstance abuse (HCC) [F19.10] 12/06/2013  . Diabetes mellitus (HCC) [E11.9] 12/06/2013  . Syncope [R55] 12/06/2013  . Leucocytosis [D72.829] 12/06/2013  . Acute respiratory failure (HCC) [J96.00] 12/06/2013  . Elevated LFTs [R94.5] 12/06/2013    Past Psychiatric History:   Past Medical History:  Past Medical History:  Diagnosis Date  . Anxiety and depression   . Chronic back pain   . Diabetes mellitus without complication (HCC)   . Diabetic neuropathy (HCC) 06/09/2017  . GERD (gastroesophageal reflux disease)   . Hypertension   . Panic attack 1990s  . Peripheral neuropathy   . Positive TB test   . Substance abuse (HCC)   . Substance induced mood disorder (HCC)   . UTI (lower urinary tract infection)     Past Surgical History:  Procedure Laterality Date  . ABDOMINAL HYSTERECTOMY    . CHOLECYSTECTOMY    . ROTATOR CUFF REPAIR    . TONSILLECTOMY     Family History:  Family History  Problem Relation Age of Onset  . Diabetes Mellitus II Father   . Hyperlipidemia Father   . Diabetes Father   . Colon cancer Father [id number]       not sure of age he was diagnosed  . Arthritis Mother   . Hypertension Mother   . Heart disease Mother   . Drug abuse Son        incarcerated with drug issues  . CAD Neg Hx    Family  Psychiatric  History:  Social History:  Social History   Substance and Sexual Activity  Alcohol Use No     Social History   Substance and Sexual Activity  Drug Use Yes  . Types: Cocaine   Comment: Heroine--only used briefly before.    Social History   Socioeconomic History  . Marital status: Single    Spouse name: None  . Number of children: 2  . Years of education: 5014  . Highest education level: None  Social Needs  . Financial resource strain: None  . Food insecurity - worry: None  . Food insecurity - inability: None  . Transportation needs - medical: None  . Transportation needs - non-medical: None  Occupational History  . 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 Smoking status: Current Every Day Smoker    Packs/day: 0.50    Years: 30.00    Pack years: 15.00    Types: Cigarettes  . Smokeless tobacco: Never Used  Substance and Sexual Activity  . Alcohol use: No  . Drug use: Yes    Types: Cocaine    Comment: Heroine--only used briefly before.  Marland Kitchen. Sexual activity: No  Other Topics Concern  . None  Social History Narrative   [place]   Fun: Doesn't do a thing - never leaves the house.   Denies religious beliefs affecting health care.    Recently moved in with father for caregiver purposes   Not in touch with older son.   Younger son incarcerated for drug related reasons.    Hospital Course:  Claudia Erickson was admitted for MDD (major depressive disorder), severe (HCC)  and crisis management.  Pt was treated discharged with the medications listed below under Medication List.  Medical problems were identified and treated as needed.  Home medications were restarted as appropriate.  Improvement was monitored by observation and Claudia Erickson 's daily report of symptom reduction.  Emotional and mental status was monitored by daily self-inventory reports completed by Claudia Erickson and  clinical staff.         Claudia Erickson was evaluated by the treatment team for stability and plans for continued recovery upon discharge. Claudia Erickson 's motivation was an integral factor for scheduling further treatment. Employment, transportation, bed availability, health status, family support, and any pending legal issues were also considered during hospital stay. Pt was offered further treatment options upon discharge including but not limited to Residential, Intensive Outpatient, and Outpatient treatment.  Claudia Erickson will follow up with the services as listed below under Follow Up Information.     Upon completion of this admission the patient was both mentally and medically stable for discharge denying suicidal or homicidal ideation, auditory or visual  hallucinations, delusional thoughts and paranoia.    Claudia Erickson responded well to treatment with Cymbalta 90 mg, Neurtion 400mg  PO TID, Remeron 30 mgwithout adverse effects.. Pt demonstrated improvement without  reported or observed adverse effects to the point of stability appropriate for outpatient management. Pertinent labs include: CMP, CBC  And A1C for which outpatient follow-up is necessary for lab recheck as mentioned below. Reviewed CBC, CMP, BAL, and UDS; all unremarkable aside from noted exceptions.   Physical Findings: AIMS: Facial and Oral Movements Muscles of Facial Expression: None, normal Lips and Perioral Area: None, normal Jaw: None, normal Tongue: None, normal,Extremity Movements Upper (arms, wrists, hands, fingers): None, normal Lower (legs, knees, ankles, toes): None, normal, Trunk Movements Neck, shoulders, hips: None, normal, Overall Severity Severity of abnormal movements (highest score from questions above): None, normal Incapacitation due to abnormal movements: None, normal Patient's awareness of abnormal movements (rate only patient's report): No Awareness, Dental Status Current problems with teeth  and/or dentures?: No Does patient usually wear dentures?: No  CIWA:    COWS:     Musculoskeletal: Strength & Muscle Tone: within normal limits Gait & Station: normal Patient leans: N/A  Psychiatric Specialty Exam: See SRA by MD Physical Exam  Nursing note and vitals reviewed. Neurological: She is alert.  Psychiatric: She has a normal mood and affect.    Review of Systems  Psychiatric/Behavioral: Negative for suicidal ideas. Depression: stable  The patient is not nervous/anxious.   All other systems reviewed and are negative.   Blood pressure 110/68, pulse 81, temperature 98.1 F (36.7 C), temperature source Oral, resp. rate 16, height 5\' 9"  (1.753 m), weight 130.2 kg (287 lb).Body mass index is 42.38 kg/m.   Have you used any form of tobacco in the last 30 days? (Cigarettes, Smokeless Tobacco, Cigars, and/or Pipes): Yes  Has this patient used any form of tobacco in the last 30 days? (Cigarettes, Smokeless Tobacco, Cigars, and/or Pipes) Yes, Yes, A prescription for an FDA-approved tobacco cessation medication was offered at discharge and the patient refused  Blood Alcohol level:  Lab Results  Component Value Date   [facility] <10 10/27/2017   ETH <5 03/15/2017    Metabolic Disorder Labs:  Lab Results  Component Value Date   HGBA1C 10.0 (H) 03/21/2017   MPG 240 03/21/2017   MPG 298 10/29/2016   No results found for: PROLACTIN No results found for: CHOL, TRIG, HDL, CHOLHDL, VLDL, LDLCALC  See Psychiatric Specialty Exam and Suicide Risk Assessment completed by Attending Physician prior to discharge.  Discharge destination:  Home  Is patient on multiple antipsychotic therapies at discharge:  No   Has Patient had three or more failed trials of antipsychotic monotherapy by history:  No  Recommended Plan for Multiple Antipsychotic Therapies: NA  Discharge Instructions    Diet - low sodium heart healthy   Complete by:  As directed    Discharge instructions   Complete by:  As  directed    Take all medications as prescribed. Keep all follow-up appointments as scheduled.  Do not consume alcohol or use illegal drugs while on prescription medications. Report any adverse effects from your medications to your primary care provider promptly.  In the event of recurrent symptoms or worsening symptoms, call 911, a crisis hotline, or go to the nearest emergency department for evaluation.   Increase activity slowly   Complete by:  As directed      Allergies as of 11/06/2017   No Known Allergies     Medication List    STOP taking these medications   citalopram 40 MG tablet Commonly known as:  CELEXA   insulin aspart protamine- aspart (70-30) 100 UNIT/ML injection Commonly known as:  NOVOLOG MIX 70/30   LORazepam 1 MG tablet Commonly known as:  ATIVAN     TAKE these medications     Indication  DULoxetine 30 MG capsule Commonly known as:  CYMBALTA Take 3 capsules (90 mg total) by mouth daily.  Indication:  Major Depressive Disorder   gabapentin 400 MG capsule Commonly known as:  NEURONTIN Take 1 capsule (400 mg total) by mouth 3 (three) times daily. What changed:  additional instructions  Indication:  Agitation, Neuropathic Pain   hydrochlorothiazide 25 MG tablet Commonly known as:  HYDRODIURIL Take 1 tablet (25 mg total) by mouth daily. For high blood pressure  Indication:  High Blood Pressure Disorder   metFORMIN 850 MG tablet Commonly known as:  GLUCOPHAGE Take 1 tablet (850 mg total) by mouth 2 (two) times daily with a meal. For diabetes management  Indication:  Type 2 Diabetes   mirtazapine 30 MG tablet Commonly known as:  REMERON Take 1 tablet (30 mg total) by mouth at bedtime. What changed:    medication strength  how much to take  additional instructions  Indication:  Major Depressive Disorder   nicotine 21 mg/24hr patch Commonly known as:  NICODERM CQ - dosed in mg/24 hours Place 1 patch (21 mg total) onto the skin daily. What  changed:  additional instructions  Indication:  Nicotine Addiction   omeprazole 20 MG tablet Commonly known as:  PRILOSEC OTC Take 1 tablet (20 mg total) by mouth daily. For acid reflux  Indication:  Heartburn      Follow-up Information    [facility] Follow up on 11/08/2017.   Why:  Hospital follow-up at 2:30PM with Dr. Alden Benjamin on Tuesday, 2/5. Please arrive by 2:15PM (sliding scale fee being that orange card is expired). (Please take orange card application to DSS when completed Mon, Tues, or Wed at 8am.  Contact information: [address] [place] [address] Phone: [phone] Fax: [phone]          Follow-up recommendations:  Activity:  as tolerated Diet:  heart healthy  Comments: Take all medications as prescribed. Keep all follow-up appointments as scheduled.  Do not consume alcohol or use illegal drugs while on prescription medications. Report any adverse effects from your medications to your primary care provider promptly.  In the event of recurrent symptoms or worsening symptoms, call 911, a crisis hotline, or go to the nearest emergency department for evaluation.   Signed: Oneta Rack, NP 11/06/2017, 8:36 AM

## 2017-11-06 NOTE — Progress Notes (Addendum)
D. Pt in bed for much of the morning- complaining of cold symptoms (malaise, sore throat). Pt concerned about being discharged at this time, reporting that she isn't ready. Dr notified of pt's concerns.  Pt currently denies SI/HI and AVH  A. Labs and vitals monitored. Pt compliant with medications. Pt supported emotionally and encouraged to express concerns and ask questions.   R. Pt remains safe with 15 minute checks. Will continue POC.

## 2017-11-06 NOTE — [facility] Suicide Risk Assessment (Signed)
[facility] Discharge Suicide Risk Assessment   Principal Problem: MDD (major depressive disorder), severe [facility]) Discharge Diagnoses:  Patient Active Problem List   Diagnosis Date Noted  . Vaginal candidiasis [B37.3] 11/01/2017  . MDD (major depressive disorder), severe (HCC) [F32.2] 10/29/2017  . Overdose [T50.901A] 10/27/2017  . Diabetic neuropathy (HCC) [E11.40] 06/09/2017  . Cocaine abuse (HCC) [F14.10] 03/16/2017  . Tobacco abuse [Z72.0] 02/02/2017  . Panic attack [F41.0]   . Chronic venous insufficiency [I87.2] 10/28/2016  . Hyponatremia [E87.1] 10/28/2016  . Hyperglycemia [R73.9]   . Intentional drug overdose (HCC) [T50.902A]   . Suicidal ideation [R45.851]   . Urinary frequency [R35.0] 04/08/2015  . Anxiety and depression [F41.9, F32.9] 12/26/2014  . Insomnia [G47.00] 12/26/2014  . Severe episode of recurrent major depressive disorder, without psychotic features (HCC) [F33.2] 08/27/2014  . Aspiration into airway [T17.908A] 12/06/2013  . Aspiration pneumonia (HCC) [J69.0] 12/06/2013  . Polysubstance abuse (HCC) [F19.10] 12/06/2013  . Diabetes mellitus (HCC) [E11.9] 12/06/2013  . Syncope [R55] 12/06/2013  . Leucocytosis [D72.829] 12/06/2013  . Acute respiratory failure (HCC) [J96.00] 12/06/2013  . Elevated LFTs [R94.5] 12/06/2013    Total Time spent with patient: 45 minutes  Musculoskeletal: Strength & Muscle Tone: within normal limits Gait & Station: normal Patient leans: N/A  Psychiatric Specialty Exam: Review of Systems  Constitutional: Negative.   HENT: Negative.   Eyes: Negative.   Respiratory: Negative.   Cardiovascular: Negative.   Gastrointestinal: Negative.   Genitourinary: Negative.   Skin: Negative.   Neurological: Negative.   Endo/Heme/Allergies: Negative.   Psychiatric/Behavioral: Negative for depression, hallucinations, memory loss, substance abuse and suicidal ideas. The patient is not nervous/anxious and does not have insomnia.     Blood pressure  126/82, pulse 98, temperature 97.9 F (36.6 C), temperature source Oral, resp. rate 16, height 5\' 9"  (1.753 m), weight 130.2 kg (287 lb).Body mass index is 42.38 kg/m.  General Appearance: Neatly dressed, pleasant, engaging well and cooperative. Appropriate behavior. Not in any distress. Good relatedness. Not internally stimulated  Eye Contact::  Good  Speech:  Spontaneous, normal prosody. Normal tone and rate.   Volume:  Normal  Mood:  Euthymic  Affect:  Appropriate and Full Range  Thought Process:  Linear  Orientation:  Full (Time, Place, and Person)  Thought Content:  No delusional theme. No preoccupation with violent thoughts. No negative ruminations. No obsession.  No hallucination in any modality.   Suicidal Thoughts:  None   Homicidal Thoughts:  No  Memory:  Immediate;   Good Recent;   Good Remote;   Good  Judgement:  Good  Insight:  Partial as she is not motivated to get into rehab at this time.   Psychomotor Activity:  Normal  Concentration:  Good  Recall:  Good  Fund of Knowledge:Good  Language: Good  Akathisia:  Negative  Handed:    AIMS (if indicated):     Assets:  Communication Skills Desire for Improvement Housing Resilience  Sleep:  Number of Hours: 5  Cognition: WNL  ADL's:  Intact   Clinical  Assessment::   52 y.o Caucasian female, divorced, lives with her father, unemployed. Background history of MDD and multiple medical comorbidity. Presented to the ER via emergency services. She was found unconscious in her motel room by the hotel maid. She had overdosed on street drugs. She had a suicide note. She had purchased fentanyl and ativan specifically to end her life. She had stopped taking care of her diabetes before the attempt. She was positive for cocaine.  Patient was managed medically before being stepped down to our unit.   Seen today. Patient states that she feels refreshed after taking time off the routine at home. Says she has had time to think through the  effects suicide would have had on her father. Say she plans to go back home and continue to take care of him. Says her spirits are back to normal. She is no longer ruminating on all the negative things that has happened in her life. She has resolved to take one day at a time. She is no longer feeling down in dumps. Feels her medication has helped. She plans to attend the support group here when she is discharged. Patient repeatedly denied any lingering suicidal thoughts. Says she is tolerating her medication well. She has been sleeping well at night. Her appetite has returned back to normal says her processing speed is back to normal. She is able to think clearly. She has been reading here. she is able to focus on task. Her thoughts are not crowded or racing. No evidence of mania. No hallucination in any modality. He is not making any delusional statement. No passivity of will/thought. She is fully in touch with reality.  No thoughts of homicide. No violent thoughts. No overwhelming anxiety. She denies any cravings for substances. She plans to engage with support groups. Continues to refuse inpatient rehab. No access to weapons.  Nursing staff reports that patient has been appropriate on the unit. Patient has been interacting well with peers. No behavioral issues. Patient has not voiced any suicidal thoughts. Patient has not been observed to be internally stimulated. Patient has been adherent with treatment recommendations. Patient has been tolerating their medication well.   Team has not been able to identify any other family to get more collateral from. Team has worked to provide as much support as is locally available. Team members feels that patient is back to her baseline level of function. Team agrees with plan to discharge patient today.    Demographic Factors:  Divorced or widowed, Low socioeconomic status and Unemployed  Loss Factors: Financial problems/change in socioeconomic  status  Historical Factors: Prior suicide attempts and Impulsivity  Risk Reduction Factors:   Sense of responsibility to family, Living with another person, especially a relative, Positive social support, Positive therapeutic relationship and Positive coping skills or problem solving skills  Continued Clinical Symptoms:  As above  Cognitive Features That Contribute To Risk:  None    Suicide Risk:  Moderate:  As most of the factors  that precipitated attempt are still static.  Patient is not having any thoughts of suicide at this time. Modifiable risk factors targeted during this admission includes depression and substance use. Demographical and historical risk factors cannot be modified. Family dynamics has not changed. We adjusted her medications and encouraged rehab. We have increased her social support in the community. She has been scheduled to attend support groups at our unit. Patient is now engaging well. Patient is reliable and is future oriented. We have buffered patient's support structures. At this point, patient is at low risk of suicide. Patient is aware of the effects of psychoactive substances on decision making process. Patient has been provided with emergency contacts. Patient acknowledges to use resources provided if unforseen circumstances changes their current risk stratification.    Follow-up Information    [facility] Follow up on 11/08/2017.   Why:  Hospital follow-up at 2:30PM with Dr. Alden BenjaminMullberry on Tuesday, 2/5. Please arrive by 2:15PM (  sliding scale fee being that orange card is expired). (Please take orange card application to DSS when completed Mon, Tues, or Wed at 8am.  Contact information: [address] [place] [address] Phone: [phone] Fax: [phone]          Plan Of Care/Follow-up recommendations:  1. Continue current psychotropic medications 2. Mental health and addiction follow up as arranged.  3. Discharge in care of their  family 4. Provided limited quantity of prescriptions   Georgiann Cocker, MD 11/06/2017, 7:57 AM

## 2017-11-06 NOTE — [facility] Group Notes (Signed)
[facility] Group Notes: (Clinical Social Work)   11/06/2017      Type of Therapy:  Group Therapy   Participation Level:  Did Not Attend despite MHT prompting   Nafeesah Lapaglia Grossman-Orr, LCSW 11/06/2017, 12:08 PM     

## 2017-11-06 NOTE — Progress Notes (Signed)
D: Pt was in the dayroom upon initial approach.  Pt presents with appropriate affect and depressed mood.  She reports she has been "in bed all day, I feel sick like my glands are swollen, I feel dizzy when I stand."  Pt reports she is feeling slightly better than she did earlier today.  Pt denies SI/HI, denies hallucinations.  Pt has been visible in milieu interacting with peers and staff appropriately.    A: Introduced self to pt.  Actively listened to pt and offered support and encouragement. Medications administered per order.  PRN medication administered for anxiety and muscle spasms.  Fall prevention techniques reviewed with pt and pt verbalized understanding.  Q15 minute safety checks maintained.  R: Pt is safe on the unit.  Pt is compliant with medications.  She reports she will inform staff of needs and concerns.  Pt verbally contracts for safety.  Will continue to monitor and assess.

## 2017-11-06 NOTE — [facility] Group Notes (Signed)
Pt was invited but did not attend orientation/goals group. 

## 2017-11-06 NOTE — Progress Notes (Signed)
D.  Pt pleasant on approach, hasn't been feeling well today.  Pt was positive for evening AA group, observed engaged in appropriate interaction with peers on the unit.  Pt denies SI/HI/AVH at this time.  Pt is hopeful for discharge early next week.  A.  Support and encouragement offered, medication given as ordered  R.  Pt remains safe on the unit, will continue to monitor.

## 2017-11-06 NOTE — Progress Notes (Signed)
Patient did attend the evening speaker AA meeting.  

## 2017-11-07 DIAGNOSIS — Z634 Disappearance and death of family member: Secondary | ICD-10-CM

## 2017-11-07 LAB — GLUCOSE, CAPILLARY
GLUCOSE-CAPILLARY: 234 mg/dL — AB (ref 65–99)
Glucose-Capillary: 209 mg/dL — ABNORMAL HIGH (ref 65–99)

## 2017-11-07 NOTE — Progress Notes (Signed)
Patient ID: Claudia Erickson, female   DOB: 10/07/1965, 52 y.o.   MRN: [id number]  Discharge Note- Pt. Denies SI/HI and A/V hallucinations. She reports that her sleep last night was fair, her appetite is good, her energy level is normal, and her concentration is good. She rates her depression level 3/10, her hopelessness level 0/10, and her anxiety 10+/10. She reports feeling ready for discharge. Belongings returned to patient at time of discharge. Patient denies any pain or discomfort at time of discharge. Discharge instructions and medications were reviewed with patient. Patient verbalized understanding of both medications and discharge instructions. Patient discharged to lobby where she was awaiting a cab. Q15 minute safety checks maintained until time of discharge. No distress noted upon discharge.

## 2017-11-07 NOTE — [facility] Suicide Risk Assessment (Signed)
[facility]   Principal Problem: MDD (major depressive disorder), severe [facility](HCC) Discharge Diagnoses:  Patient Active Problem List   Diagnosis Date Noted  . Vaginal candidiasis [B37.3] 11/01/2017  . MDD (major depressive disorder), severe (HCC) [F32.2] 10/29/2017  . Overdose [T50.901A] 10/27/2017  . Diabetic neuropathy (HCC) [E11.40] 06/09/2017  . Cocaine abuse (HCC) [F14.10] 03/16/2017  . Tobacco abuse [Z72.0] 02/02/2017  . Panic attack [F41.0]   . Chronic venous insufficiency [I87.2] 10/28/2016  . Hyponatremia [E87.1] 10/28/2016  . Hyperglycemia [R73.9]   . Intentional drug overdose (HCC) [T50.902A]   . Suicidal ideation [R45.851]   . Urinary frequency [R35.0] 04/08/2015  . Anxiety and depression [F41.9, F32.9] 12/26/2014  . Insomnia [G47.00] 12/26/2014  . Severe episode of recurrent major depressive disorder, without psychotic features (HCC) [F33.2] 08/27/2014  . Aspiration into airway [T17.908A] 12/06/2013  . Aspiration pneumonia (HCC) [J69.0] 12/06/2013  . Polysubstance abuse (HCC) [F19.10] 12/06/2013  . Diabetes mellitus (HCC) [E11.9] 12/06/2013  . Syncope [R55] 12/06/2013  . Leucocytosis [D72.829] 12/06/2013  . Acute respiratory failure (HCC) [J96.00] 12/06/2013  . Elevated LFTs [R94.5] 12/06/2013    Total Time spent with patient: 45 minutes  Musculoskeletal: Strength & Muscle Tone: within normal limits Gait & Station: normal Patient leans: N/A  Psychiatric Specialty Exam: Review of Systems  Constitutional: Negative.   HENT: Negative.   Eyes: Negative.   Respiratory: Negative.   Cardiovascular: Negative.   Gastrointestinal: Negative.   Genitourinary: Negative.   Skin: Negative.   Neurological: Negative.   Endo/Heme/Allergies: Negative.   Psychiatric/Behavioral: Negative for depression, hallucinations, memory loss, substance abuse and suicidal ideas. The patient is not nervous/anxious and does not have insomnia.     Blood pressure  131/90, pulse (!) 102, temperature 97.8 F (36.6 C), temperature source Oral, resp. rate 20, height 5\' 9"  (1.753 m), weight 130.2 kg (287 lb).Body mass index is 42.38 kg/m.  General Appearance: Neatly dressed, pleasant, engaging well and cooperative. Appropriate behavior. Not in any distress. Good relatedness. Not internally stimulated  Eye Contact::  Good  Speech:  Spontaneous, normal prosody. Normal tone and rate.   Volume:  Normal  Mood:  Euthymic  Affect:  Appropriate and Full Range  Thought Process:  Linear  Orientation:  Full (Time, Place, and Person)  Thought Content:  No delusional theme. No preoccupation with violent thoughts. No negative ruminations. No obsession.  No hallucination in any modality.   Suicidal Thoughts:  None   Homicidal Thoughts:  No  Memory:  Immediate;   Good Recent;   Good Remote;   Good  Judgement:  Good  Insight:  Partial as she is not motivated to get into rehab at this time.   Psychomotor Activity:  Normal  Concentration:  Good  Recall:  Good  Fund of Knowledge:Good  Language: Good  Akathisia:  Negative  Handed:    AIMS (if indicated):     Assets:  Communication Skills Desire for Improvement Housing Resilience  Sleep:  Number of Hours: 4  Cognition: WNL  ADL's:  Intact   Clinical  Assessment::   52 y.o Caucasian female, divorced, lives with her father, unemployed. Background history of MDD and multiple medical comorbidity. Presented to the ER via emergency services. She was found unconscious in her motel room by the hotel maid. She had overdosed on street drugs. She had a suicide note. She had purchased fentanyl and ativan specifically to end her life. She had stopped taking care of her diabetes before the attempt. She was positive for  cocaine. Patient was managed medically before being stepped down to our unit.   Seen today. Says she is feeling good again. Maintained the information she gave me yesterday.   Nursing staff reports that patient  has been appropriate on the unit. Patient has been interacting well with peers. No behavioral issues. Patient has not voiced any suicidal thoughts. Patient has not been observed to be internally stimulated. Patient has been adherent with treatment recommendations. Patient has been tolerating their medication well.   Team has not been able to identify any other family to get more collateral from. Team has worked to provide as much support as is locally available. Team members feels that patient is back to her baseline level of function. Team agrees with plan to discharge patient today.    Demographic Factors:  Divorced or widowed, Low socioeconomic status and Unemployed  Loss Factors: Financial problems/change in socioeconomic status  Historical Factors: Prior suicide attempts and Impulsivity  Risk Reduction Factors:   Sense of responsibility to family, Living with another person, especially a relative, Positive social support, Positive therapeutic relationship and Positive coping skills or problem solving skills  Continued Clinical Symptoms:  As above  Cognitive Features That Contribute To Risk:  None    Suicide Risk:  Moderate:  As most of the factors  that precipitated attempt are still static.  Patient is not having any thoughts of suicide at this time. Modifiable risk factors targeted during this admission includes depression and substance use. Demographical and historical risk factors cannot be modified. Family dynamics has not changed. We adjusted her medications and encouraged rehab. We have increased her social support in the community. She has been scheduled to attend support groups at our unit. Patient is now engaging well. Patient is reliable and is future oriented. We have buffered patient's support structures. At this point, patient is at low risk of suicide. Patient is aware of the effects of psychoactive substances on decision making process. Patient has been provided with  emergency contacts. Patient acknowledges to use resources provided if unforseen circumstances changes their current risk stratification.    Follow-up Information    [facility] Follow up on 11/08/2017.   Why:  Hospital follow-up at 2:30PM with Dr. Alden Benjamin on Tuesday, 2/5. Please arrive by 2:15PM (sliding scale fee being that orange card is expired). (Please take orange card application to DSS when completed Mon, Tues, or Wed at 8am.  Contact information: [address] [place] [address] Phone: [phone] Fax: [phone]          Plan Of Care/Follow-up recommendations:  1. Continue current psychotropic medications 2. Mental health and addiction follow up as arranged.  3. Discharge in care of their family 4. Provided limited quantity of prescriptions   Georgiann Cocker, MD 11/07/2017, 12:37 PM

## 2017-11-07 NOTE — Progress Notes (Signed)
Recreation Therapy Notes  Date: 11/07/17 Time: 0930 Location: [address] Dayroom  Group Topic: Stress Management  Goal Area(s) Addresses:  Patient will verbalize importance of using healthy stress management.  Patient will identify positive emotions associated with healthy stress management.   Behavioral Response: Engaged  Intervention: Stress Management  Activity :  Gratitude Meditation.  LRT played meditation from Calm app to allow patients to meditate on being grateful for the things, skills or people in their lives that have had a positive effect on their lives.  Education:  Stress Management, Discharge Planning.   Education Outcome: Acknowledges edcuation/In group clarification offered/Needs additional education  Clinical Observations/Feedback: Pt attended group.    Alwin Lanigan Linday, LRT/CTRS         Caroll RancherLindsay, Amreen Raczkowski A 11/07/2017 11:07 AM

## 2017-11-07 NOTE — Progress Notes (Signed)
  [facility] :  Will you be returning to the same living situation after discharge:  Yes,  home At discharge, do you have transportation home?: Yes,  family member Do you have the ability to pay for your medications: Yes,  mental health  Release of information consent forms completed and submitted to medical records by CSW.  Patient to Follow up at: Follow-up Information    [facility] Follow up on 11/08/2017.   Why:  Hospital follow-up at 2:30PM with Dr. Alden BenjaminMullberry on Tuesday, 2/5. Please arrive by 2:15PM (sliding scale fee being that orange card is expired). (Please take orange card application to DSS when completed Mon, Tues, or Wed at 8am.  Contact information: [address] [place], [place] [address] Phone: [phone] Fax: [phone]          Next level of care provider [facility] Link:no  Safety Planning and Suicide Prevention discussed: Yes,  SPE completed with pt and her father. SPI pamphlet and Mobile Crisis information provided to pt.   Have you used any form of tobacco in the last 30 days? (Cigarettes, Smokeless Tobacco, Cigars, and/or Pipes): Yes  Has patient been referred to the Quitline?: Patient refused referral  Patient has been referred for addiction treatment: Pt declined.   Pulte HomesHeather N Smart, LCSW 11/07/2017, 9:13 AM

## 2017-11-08 ENCOUNTER — Ambulatory Visit: Payer: Self-pay | Admitting: Internal Medicine

## 2017-11-25 ENCOUNTER — Ambulatory Visit: Payer: Self-pay | Admitting: Internal Medicine

## 2018-02-01 DEATH — deceased

## 2019-10-27 IMAGING — DX DG CHEST 1V PORT
1 series · 1 of 1 positions shown · non-contrast
Comparison: 10/27/2016

CLINICAL DATA: Altered mental status and recent apparent overdose

EXAM:
PORTABLE CHEST 1 VIEW

[chest ap]
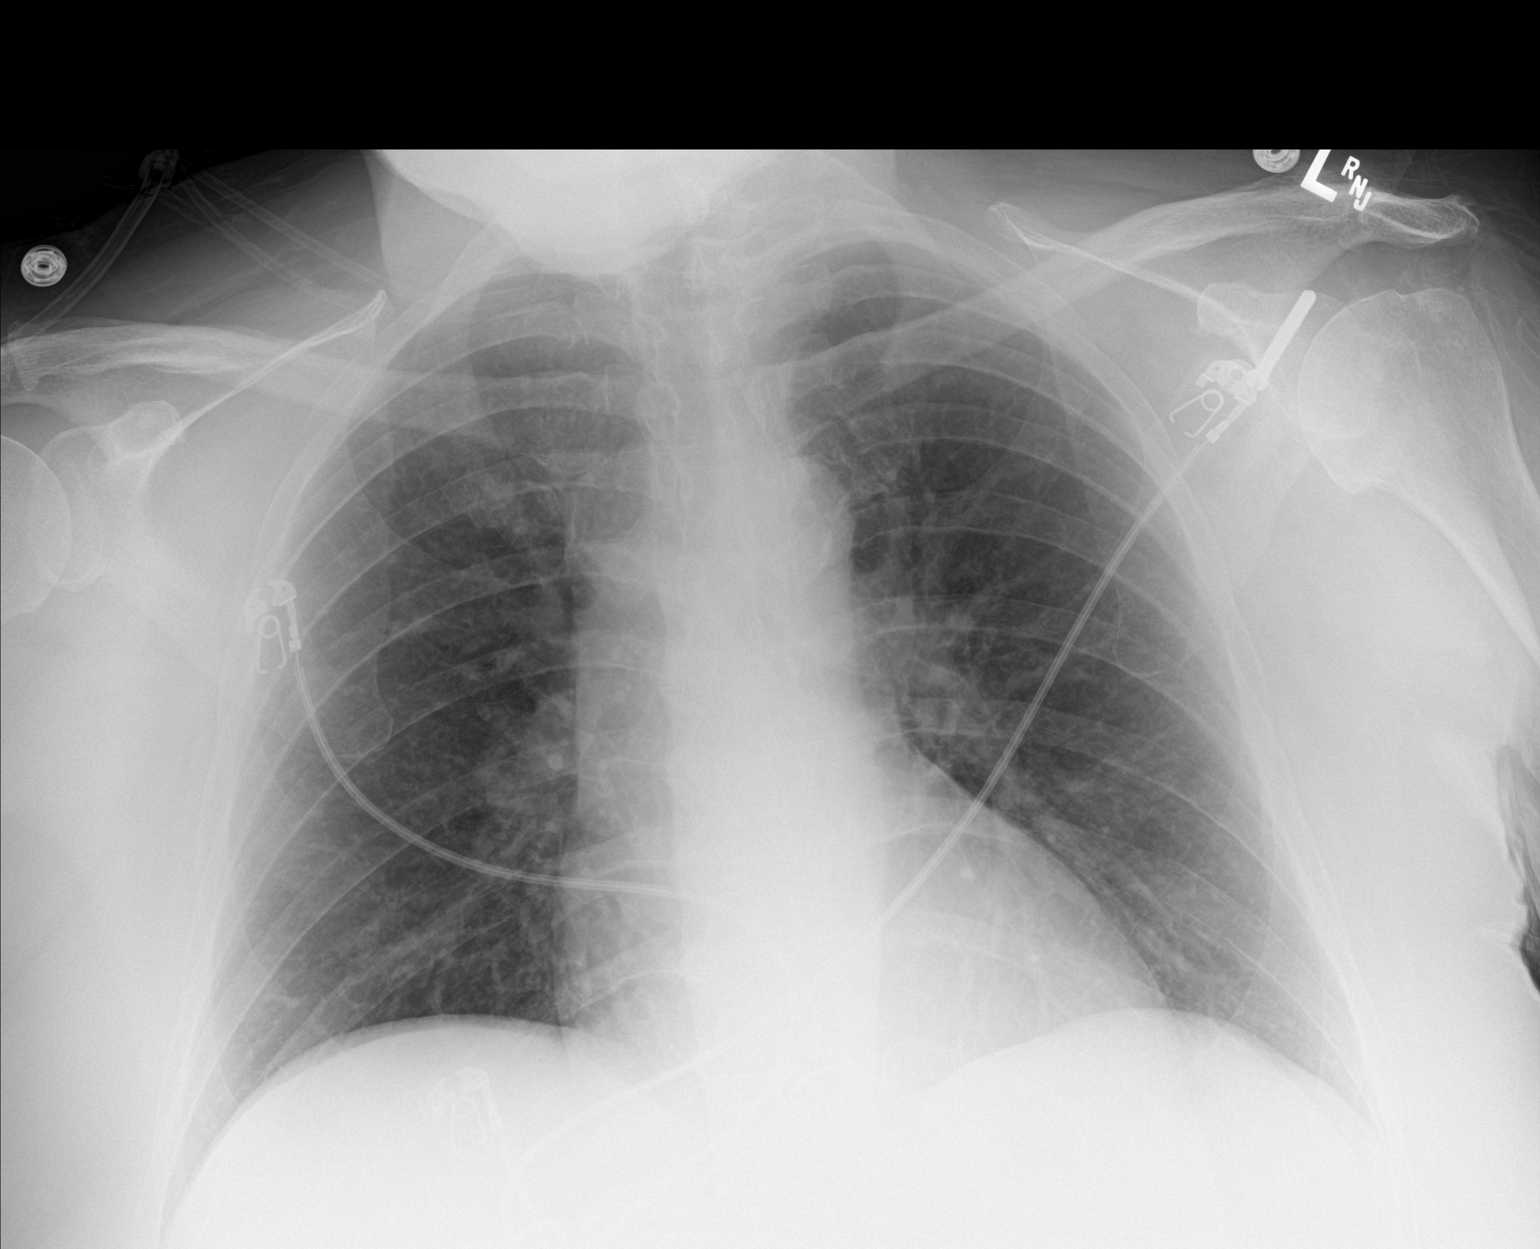

[1 of 1 positions shown; findings below may reference images not displayed]

FINDINGS: The heart size and mediastinal contours are within normal limits.
Aortic calcifications are again seen. Both lungs are clear. The
visualized skeletal structures are unremarkable.
IMPRESSION: No acute abnormality noted.

## 2019-10-28 IMAGING — DX DG CHEST 2V
2 series · 2 of 2 positions shown · non-contrast
Comparison: 10/27/2017

CLINICAL DATA: 51-year-old female with a history of encephalopathy

EXAM:
CHEST  2 VIEW

[chest lat]
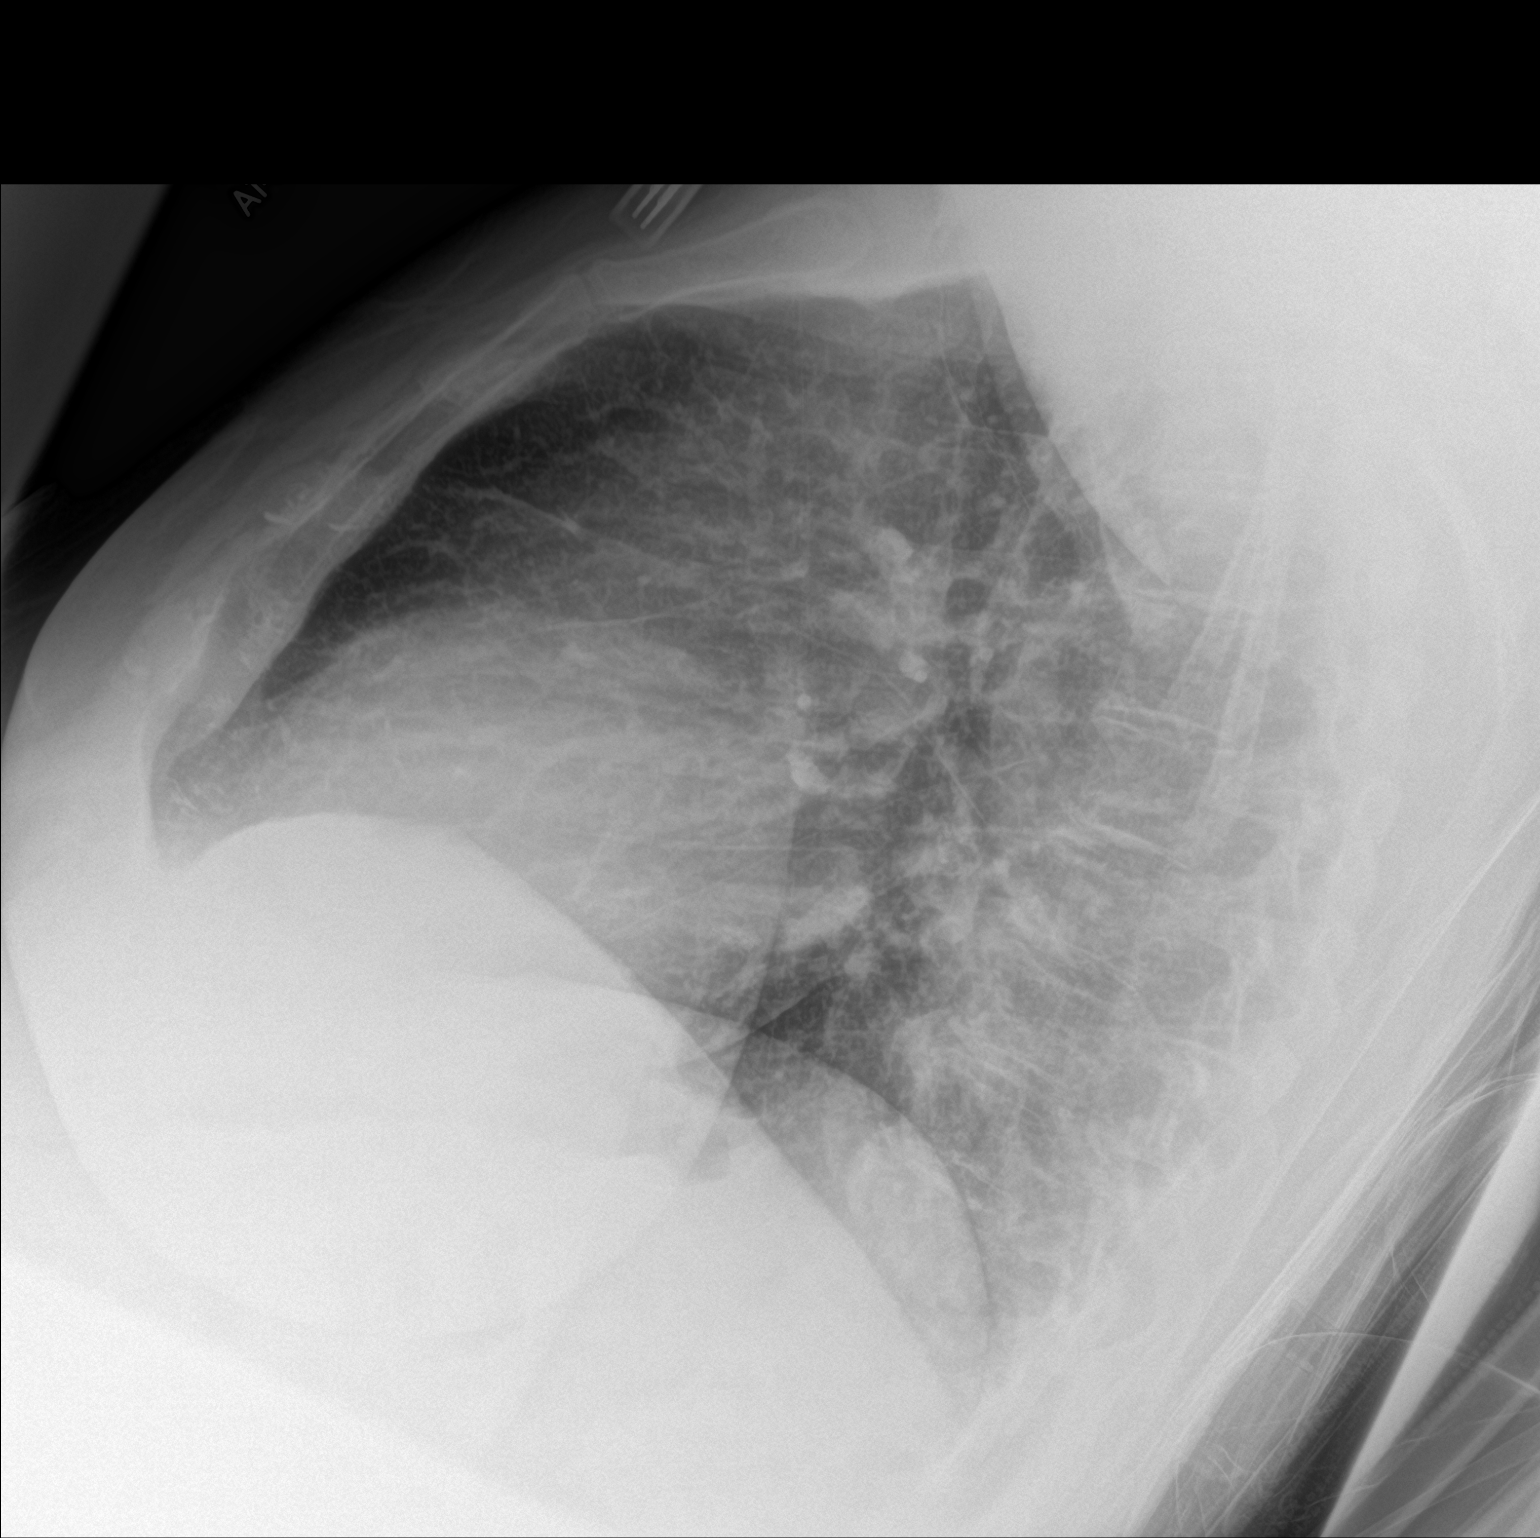

[chest ap]
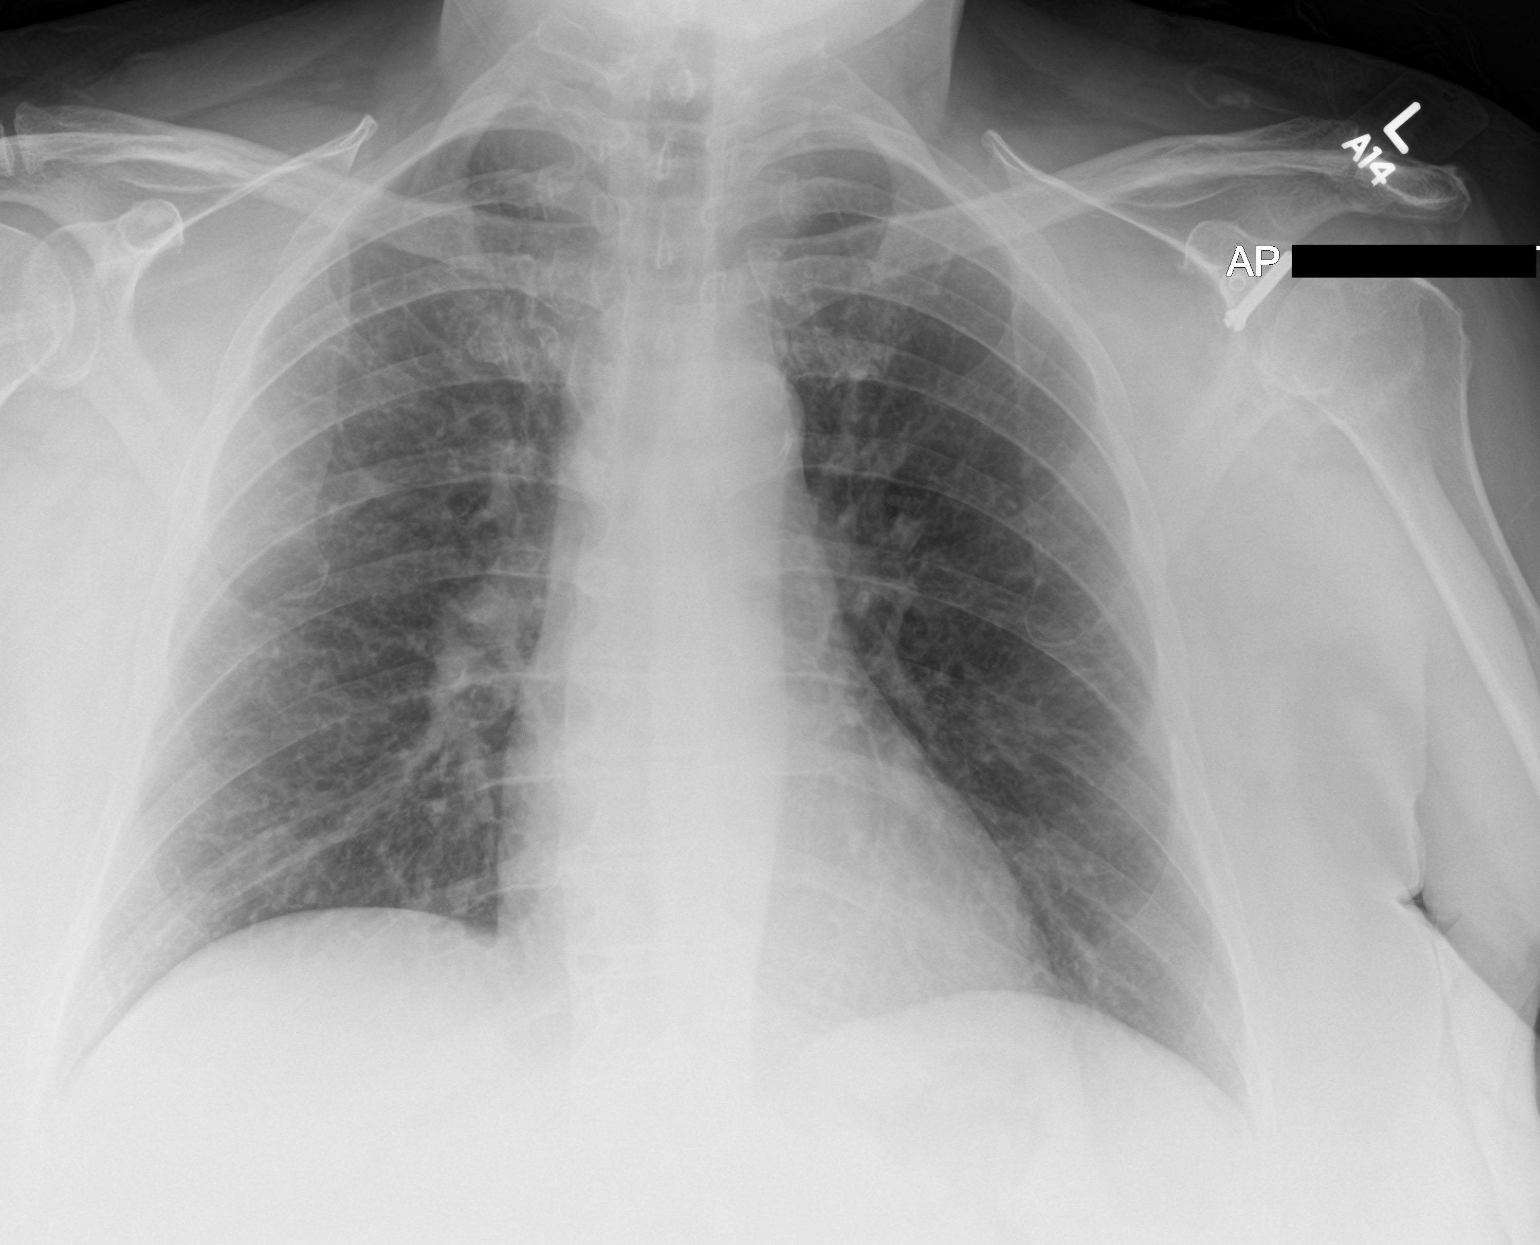

[2 of 2 positions shown; findings below may reference images not displayed]

FINDINGS: Cardiomediastinal silhouette unchanged in size and contour.

No evidence of central vascular congestion. No interlobular septal
thickening. No confluent airspace disease. No pleural effusion or
pneumothorax.

Degenerative changes of the spine without acute fracture identified.
Surgical changes of the left glenoid.
IMPRESSION: No radiographic evidence of acute cardiopulmonary disease.
